# Patient Record
Sex: Female | Born: 1945 | Race: White | Hispanic: No | Smoking: Former smoker
Health system: Southern US, Community
[De-identification: ages and names within clinical notes are randomized; demographics above are authoritative.]

## PROBLEM LIST (undated history)

## (undated) DIAGNOSIS — H3530 Unspecified macular degeneration: Secondary | ICD-10-CM

## (undated) DIAGNOSIS — E781 Pure hyperglyceridemia: Secondary | ICD-10-CM

## (undated) DIAGNOSIS — C499 Malignant neoplasm of connective and soft tissue, unspecified: Secondary | ICD-10-CM

## (undated) DIAGNOSIS — C4499 Other specified malignant neoplasm of skin, unspecified: Secondary | ICD-10-CM

## (undated) DIAGNOSIS — I447 Left bundle-branch block, unspecified: Secondary | ICD-10-CM

## (undated) DIAGNOSIS — I6502 Occlusion and stenosis of left vertebral artery: Secondary | ICD-10-CM

## (undated) DIAGNOSIS — Z8679 Personal history of other diseases of the circulatory system: Secondary | ICD-10-CM

## (undated) DIAGNOSIS — I10 Essential (primary) hypertension: Secondary | ICD-10-CM

## (undated) DIAGNOSIS — K219 Gastro-esophageal reflux disease without esophagitis: Secondary | ICD-10-CM

## (undated) DIAGNOSIS — D509 Iron deficiency anemia, unspecified: Secondary | ICD-10-CM

## (undated) DIAGNOSIS — Z8719 Personal history of other diseases of the digestive system: Secondary | ICD-10-CM

## (undated) DIAGNOSIS — E119 Type 2 diabetes mellitus without complications: Secondary | ICD-10-CM

## (undated) DIAGNOSIS — K635 Polyp of colon: Secondary | ICD-10-CM

## (undated) DIAGNOSIS — I471 Supraventricular tachycardia: Secondary | ICD-10-CM

## (undated) DIAGNOSIS — N200 Calculus of kidney: Secondary | ICD-10-CM

## (undated) HISTORY — DX: Occlusion and stenosis of left vertebral artery: I65.02

## (undated) HISTORY — PX: FRACTURE SURGERY: SHX138

## (undated) HISTORY — DX: Pure hyperglyceridemia: E78.1

## (undated) HISTORY — DX: Essential (primary) hypertension: I10

## (undated) HISTORY — DX: Type 2 diabetes mellitus without complications: E11.9

## (undated) HISTORY — DX: Left bundle-branch block, unspecified: I44.7

## (undated) HISTORY — DX: Supraventricular tachycardia: I47.1

## (undated) HISTORY — DX: Unspecified macular degeneration: H35.30

## (undated) HISTORY — DX: Personal history of other diseases of the circulatory system: Z86.79

## (undated) HISTORY — DX: Polyp of colon: K63.5

---
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
## 1966-07-18 HISTORY — PX: APPENDECTOMY: SHX54

## 1970-07-18 HISTORY — PX: CYSTOSCOPY/RETROGRADE/URETEROSCOPY/STONE EXTRACTION WITH BASKET: SHX5317

## 1979-07-19 HISTORY — PX: EXCISIONAL HEMORRHOIDECTOMY: SHX1541

## 1992-07-18 HISTORY — PX: TOTAL ABDOMINAL HYSTERECTOMY: SHX209

## 1994-07-18 DIAGNOSIS — E781 Pure hyperglyceridemia: Secondary | ICD-10-CM

## 1994-07-18 HISTORY — DX: Pure hyperglyceridemia: E78.1

## 1997-07-18 HISTORY — PX: CYSTOSCOPY W/ URETEROSCOPY W/ LITHOTRIPSY: SUR380

## 1998-07-18 DIAGNOSIS — I10 Essential (primary) hypertension: Secondary | ICD-10-CM

## 1998-07-18 HISTORY — DX: Essential (primary) hypertension: I10

## 2005-07-18 HISTORY — PX: ORIF PROXIMAL TIBIAL PLATEAU FRACTURE: SUR953

## 2007-07-19 DIAGNOSIS — I447 Left bundle-branch block, unspecified: Secondary | ICD-10-CM

## 2007-07-19 DIAGNOSIS — I471 Supraventricular tachycardia, unspecified: Secondary | ICD-10-CM

## 2007-07-19 HISTORY — DX: Left bundle-branch block, unspecified: I44.7

## 2007-07-19 HISTORY — DX: Supraventricular tachycardia: I47.1

## 2007-07-19 HISTORY — DX: Supraventricular tachycardia, unspecified: I47.10

## 2007-08-19 HISTORY — PX: OTHER SURGICAL HISTORY: SHX169

## 2009-01-15 HISTORY — PX: OTHER SURGICAL HISTORY: SHX169

## 2010-07-18 DIAGNOSIS — E119 Type 2 diabetes mellitus without complications: Secondary | ICD-10-CM | POA: Insufficient documentation

## 2010-07-18 HISTORY — DX: Type 2 diabetes mellitus without complications: E11.9

## 2011-10-25 DIAGNOSIS — L989 Disorder of the skin and subcutaneous tissue, unspecified: Secondary | ICD-10-CM | POA: Diagnosis not present

## 2011-10-25 DIAGNOSIS — L820 Inflamed seborrheic keratosis: Secondary | ICD-10-CM | POA: Diagnosis not present

## 2011-10-25 DIAGNOSIS — L819 Disorder of pigmentation, unspecified: Secondary | ICD-10-CM | POA: Diagnosis not present

## 2011-11-02 DIAGNOSIS — I10 Essential (primary) hypertension: Secondary | ICD-10-CM | POA: Diagnosis not present

## 2011-11-02 DIAGNOSIS — F411 Generalized anxiety disorder: Secondary | ICD-10-CM | POA: Diagnosis not present

## 2011-11-02 DIAGNOSIS — E781 Pure hyperglyceridemia: Secondary | ICD-10-CM | POA: Diagnosis not present

## 2011-11-02 DIAGNOSIS — E119 Type 2 diabetes mellitus without complications: Secondary | ICD-10-CM | POA: Diagnosis not present

## 2011-11-04 DIAGNOSIS — H353190 Nonexudative age-related macular degeneration, unspecified eye, stage unspecified: Secondary | ICD-10-CM | POA: Diagnosis not present

## 2011-11-24 DIAGNOSIS — E119 Type 2 diabetes mellitus without complications: Secondary | ICD-10-CM | POA: Diagnosis not present

## 2011-11-24 DIAGNOSIS — H353190 Nonexudative age-related macular degeneration, unspecified eye, stage unspecified: Secondary | ICD-10-CM | POA: Diagnosis not present

## 2012-02-15 DIAGNOSIS — R5383 Other fatigue: Secondary | ICD-10-CM | POA: Diagnosis not present

## 2012-02-15 DIAGNOSIS — F411 Generalized anxiety disorder: Secondary | ICD-10-CM | POA: Diagnosis not present

## 2012-02-15 DIAGNOSIS — R5381 Other malaise: Secondary | ICD-10-CM | POA: Diagnosis not present

## 2012-02-15 DIAGNOSIS — R002 Palpitations: Secondary | ICD-10-CM | POA: Diagnosis not present

## 2012-02-15 DIAGNOSIS — E119 Type 2 diabetes mellitus without complications: Secondary | ICD-10-CM | POA: Diagnosis not present

## 2012-02-15 DIAGNOSIS — R079 Chest pain, unspecified: Secondary | ICD-10-CM | POA: Diagnosis not present

## 2012-02-17 DIAGNOSIS — E785 Hyperlipidemia, unspecified: Secondary | ICD-10-CM | POA: Diagnosis not present

## 2012-02-17 DIAGNOSIS — R002 Palpitations: Secondary | ICD-10-CM | POA: Diagnosis not present

## 2012-02-17 DIAGNOSIS — E119 Type 2 diabetes mellitus without complications: Secondary | ICD-10-CM | POA: Diagnosis not present

## 2012-02-21 DIAGNOSIS — R079 Chest pain, unspecified: Secondary | ICD-10-CM | POA: Diagnosis not present

## 2012-02-22 DIAGNOSIS — R002 Palpitations: Secondary | ICD-10-CM | POA: Diagnosis not present

## 2012-02-23 DIAGNOSIS — I447 Left bundle-branch block, unspecified: Secondary | ICD-10-CM | POA: Diagnosis not present

## 2012-03-07 DIAGNOSIS — R0602 Shortness of breath: Secondary | ICD-10-CM | POA: Diagnosis not present

## 2012-03-30 DIAGNOSIS — R002 Palpitations: Secondary | ICD-10-CM | POA: Diagnosis not present

## 2012-04-02 DIAGNOSIS — I10 Essential (primary) hypertension: Secondary | ICD-10-CM | POA: Diagnosis not present

## 2012-04-02 DIAGNOSIS — E119 Type 2 diabetes mellitus without complications: Secondary | ICD-10-CM | POA: Diagnosis not present

## 2012-04-02 DIAGNOSIS — R002 Palpitations: Secondary | ICD-10-CM | POA: Diagnosis not present

## 2012-04-05 DIAGNOSIS — R002 Palpitations: Secondary | ICD-10-CM | POA: Diagnosis not present

## 2012-04-23 DIAGNOSIS — I447 Left bundle-branch block, unspecified: Secondary | ICD-10-CM | POA: Diagnosis not present

## 2012-05-07 DIAGNOSIS — H353190 Nonexudative age-related macular degeneration, unspecified eye, stage unspecified: Secondary | ICD-10-CM | POA: Diagnosis not present

## 2012-07-24 DIAGNOSIS — I10 Essential (primary) hypertension: Secondary | ICD-10-CM | POA: Diagnosis not present

## 2012-07-24 DIAGNOSIS — F329 Major depressive disorder, single episode, unspecified: Secondary | ICD-10-CM | POA: Diagnosis not present

## 2012-07-24 DIAGNOSIS — R5383 Other fatigue: Secondary | ICD-10-CM | POA: Diagnosis not present

## 2012-07-24 DIAGNOSIS — E785 Hyperlipidemia, unspecified: Secondary | ICD-10-CM | POA: Diagnosis not present

## 2012-07-24 DIAGNOSIS — E119 Type 2 diabetes mellitus without complications: Secondary | ICD-10-CM | POA: Diagnosis not present

## 2012-07-24 DIAGNOSIS — R5381 Other malaise: Secondary | ICD-10-CM | POA: Diagnosis not present

## 2012-07-25 DIAGNOSIS — I447 Left bundle-branch block, unspecified: Secondary | ICD-10-CM | POA: Diagnosis not present

## 2012-08-06 DIAGNOSIS — Z1231 Encounter for screening mammogram for malignant neoplasm of breast: Secondary | ICD-10-CM | POA: Diagnosis not present

## 2012-09-19 DIAGNOSIS — I447 Left bundle-branch block, unspecified: Secondary | ICD-10-CM | POA: Diagnosis not present

## 2012-11-20 DIAGNOSIS — F329 Major depressive disorder, single episode, unspecified: Secondary | ICD-10-CM | POA: Diagnosis not present

## 2012-11-20 DIAGNOSIS — R05 Cough: Secondary | ICD-10-CM | POA: Diagnosis not present

## 2012-11-20 DIAGNOSIS — E119 Type 2 diabetes mellitus without complications: Secondary | ICD-10-CM | POA: Diagnosis not present

## 2012-11-20 DIAGNOSIS — I10 Essential (primary) hypertension: Secondary | ICD-10-CM | POA: Diagnosis not present

## 2012-11-22 DIAGNOSIS — E119 Type 2 diabetes mellitus without complications: Secondary | ICD-10-CM | POA: Diagnosis not present

## 2012-11-22 DIAGNOSIS — H353190 Nonexudative age-related macular degeneration, unspecified eye, stage unspecified: Secondary | ICD-10-CM | POA: Diagnosis not present

## 2013-01-04 DIAGNOSIS — I447 Left bundle-branch block, unspecified: Secondary | ICD-10-CM | POA: Diagnosis not present

## 2013-03-28 DIAGNOSIS — F411 Generalized anxiety disorder: Secondary | ICD-10-CM | POA: Diagnosis not present

## 2013-03-28 DIAGNOSIS — E785 Hyperlipidemia, unspecified: Secondary | ICD-10-CM | POA: Diagnosis not present

## 2013-03-28 DIAGNOSIS — I471 Supraventricular tachycardia: Secondary | ICD-10-CM | POA: Diagnosis not present

## 2013-03-28 DIAGNOSIS — F329 Major depressive disorder, single episode, unspecified: Secondary | ICD-10-CM | POA: Diagnosis not present

## 2013-03-28 DIAGNOSIS — Z79899 Other long term (current) drug therapy: Secondary | ICD-10-CM | POA: Diagnosis not present

## 2013-03-28 DIAGNOSIS — F3289 Other specified depressive episodes: Secondary | ICD-10-CM | POA: Diagnosis not present

## 2013-03-28 DIAGNOSIS — I10 Essential (primary) hypertension: Secondary | ICD-10-CM | POA: Diagnosis not present

## 2013-03-28 DIAGNOSIS — E1139 Type 2 diabetes mellitus with other diabetic ophthalmic complication: Secondary | ICD-10-CM | POA: Diagnosis not present

## 2013-04-25 DIAGNOSIS — I447 Left bundle-branch block, unspecified: Secondary | ICD-10-CM | POA: Diagnosis not present

## 2013-06-03 DIAGNOSIS — H353190 Nonexudative age-related macular degeneration, unspecified eye, stage unspecified: Secondary | ICD-10-CM | POA: Diagnosis not present

## 2013-06-03 DIAGNOSIS — H2510 Age-related nuclear cataract, unspecified eye: Secondary | ICD-10-CM | POA: Diagnosis not present

## 2013-07-18 DIAGNOSIS — C499 Malignant neoplasm of connective and soft tissue, unspecified: Secondary | ICD-10-CM

## 2013-07-18 DIAGNOSIS — C4499 Other specified malignant neoplasm of skin, unspecified: Secondary | ICD-10-CM

## 2013-07-18 HISTORY — DX: Other specified malignant neoplasm of skin, unspecified: C44.99

## 2013-07-18 HISTORY — DX: Malignant neoplasm of connective and soft tissue, unspecified: C49.9

## 2013-07-18 HISTORY — PX: OTHER SURGICAL HISTORY: SHX169

## 2013-07-18 HISTORY — PX: IRRIGATION AND DEBRIDEMENT SEBACEOUS CYST: SHX5255

## 2013-07-18 HISTORY — PX: SOFT TISSUE BIOPSY: SHX1053

## 2013-07-24 DIAGNOSIS — L03319 Cellulitis of trunk, unspecified: Secondary | ICD-10-CM | POA: Diagnosis not present

## 2013-07-24 DIAGNOSIS — L0390 Cellulitis, unspecified: Secondary | ICD-10-CM | POA: Diagnosis not present

## 2013-07-24 DIAGNOSIS — L02219 Cutaneous abscess of trunk, unspecified: Secondary | ICD-10-CM | POA: Diagnosis not present

## 2013-07-24 DIAGNOSIS — E1139 Type 2 diabetes mellitus with other diabetic ophthalmic complication: Secondary | ICD-10-CM | POA: Diagnosis not present

## 2013-07-24 DIAGNOSIS — L0291 Cutaneous abscess, unspecified: Secondary | ICD-10-CM | POA: Diagnosis not present

## 2013-07-24 DIAGNOSIS — B9689 Other specified bacterial agents as the cause of diseases classified elsewhere: Secondary | ICD-10-CM | POA: Diagnosis not present

## 2013-09-18 DIAGNOSIS — D485 Neoplasm of uncertain behavior of skin: Secondary | ICD-10-CM | POA: Diagnosis not present

## 2013-09-18 DIAGNOSIS — D235 Other benign neoplasm of skin of trunk: Secondary | ICD-10-CM | POA: Diagnosis not present

## 2013-09-18 DIAGNOSIS — Z1231 Encounter for screening mammogram for malignant neoplasm of breast: Secondary | ICD-10-CM | POA: Diagnosis not present

## 2013-09-18 DIAGNOSIS — L723 Sebaceous cyst: Secondary | ICD-10-CM | POA: Diagnosis not present

## 2013-09-18 DIAGNOSIS — L820 Inflamed seborrheic keratosis: Secondary | ICD-10-CM | POA: Diagnosis not present

## 2013-09-20 DIAGNOSIS — E119 Type 2 diabetes mellitus without complications: Secondary | ICD-10-CM | POA: Diagnosis not present

## 2013-09-20 DIAGNOSIS — K219 Gastro-esophageal reflux disease without esophagitis: Secondary | ICD-10-CM | POA: Diagnosis not present

## 2013-09-20 DIAGNOSIS — E785 Hyperlipidemia, unspecified: Secondary | ICD-10-CM | POA: Diagnosis not present

## 2013-09-20 DIAGNOSIS — L0291 Cutaneous abscess, unspecified: Secondary | ICD-10-CM | POA: Diagnosis not present

## 2013-09-20 DIAGNOSIS — I10 Essential (primary) hypertension: Secondary | ICD-10-CM | POA: Diagnosis not present

## 2013-09-20 DIAGNOSIS — L0390 Cellulitis, unspecified: Secondary | ICD-10-CM | POA: Diagnosis not present

## 2013-09-30 DIAGNOSIS — E119 Type 2 diabetes mellitus without complications: Secondary | ICD-10-CM | POA: Diagnosis not present

## 2013-09-30 DIAGNOSIS — D235 Other benign neoplasm of skin of trunk: Secondary | ICD-10-CM | POA: Diagnosis not present

## 2013-09-30 DIAGNOSIS — E785 Hyperlipidemia, unspecified: Secondary | ICD-10-CM | POA: Diagnosis not present

## 2013-09-30 DIAGNOSIS — D485 Neoplasm of uncertain behavior of skin: Secondary | ICD-10-CM | POA: Diagnosis not present

## 2013-09-30 DIAGNOSIS — I10 Essential (primary) hypertension: Secondary | ICD-10-CM | POA: Diagnosis not present

## 2013-10-01 DIAGNOSIS — R011 Cardiac murmur, unspecified: Secondary | ICD-10-CM | POA: Diagnosis not present

## 2013-11-19 DIAGNOSIS — H2510 Age-related nuclear cataract, unspecified eye: Secondary | ICD-10-CM | POA: Diagnosis not present

## 2013-11-19 DIAGNOSIS — H353190 Nonexudative age-related macular degeneration, unspecified eye, stage unspecified: Secondary | ICD-10-CM | POA: Diagnosis not present

## 2013-11-28 ENCOUNTER — Encounter: Payer: Self-pay | Admitting: Cardiology

## 2013-11-28 ENCOUNTER — Ambulatory Visit (INDEPENDENT_AMBULATORY_CARE_PROVIDER_SITE_OTHER): Payer: Medicare Other | Admitting: Cardiology

## 2013-11-28 VITALS — BP 142/86 | Ht 63.5 in | Wt 148.9 lb

## 2013-11-28 DIAGNOSIS — I498 Other specified cardiac arrhythmias: Secondary | ICD-10-CM | POA: Diagnosis not present

## 2013-11-28 DIAGNOSIS — E782 Mixed hyperlipidemia: Secondary | ICD-10-CM | POA: Diagnosis not present

## 2013-11-28 DIAGNOSIS — I471 Supraventricular tachycardia: Secondary | ICD-10-CM

## 2013-11-28 DIAGNOSIS — I701 Atherosclerosis of renal artery: Secondary | ICD-10-CM | POA: Diagnosis not present

## 2013-11-28 DIAGNOSIS — Z79899 Other long term (current) drug therapy: Secondary | ICD-10-CM

## 2013-11-28 DIAGNOSIS — I447 Left bundle-branch block, unspecified: Secondary | ICD-10-CM | POA: Diagnosis not present

## 2013-11-28 NOTE — Progress Notes (Signed)
PATIENT: Isabella Wright MRN: [id number] DOB: 01-Jun-1946 PCP: Pcp Not In System  Clinic Note: Chief Complaint  Patient presents with  . Establish Care    , NO CHEST PAIN RECENTLY, SOB AT NIGHT RECENTLY -INDERAL; HX SVT, LBBB,-NOT SOB AFTER SHE CHG EFROM LNG ACTING TO SHORT ACTING PROPANADOL, NOEDEMA    HPI: Cathyjo Seeger is a 68 y.o. female - retired Equities trader with a PMH below who presents today for establishing cardiology care in [place] after moving up from [facility] 2 months ago.. She is widowed now for just under 2 years. Her husband was a retired Publishing rights manager who recently died of cancer.  She moved to [place] to be close to her family. She comes well prepared with a long list of medical history which includes SVT, hypertension, diabetes, hypercholesterolemia which has been very difficult to manage on macular degeneration and history of kidney stones. She is to significant SVT episodes that were often associated with stress. Several months ago (January 2015), she underwent an I&D procedure on a chest wall abscess. Ever since that timeframe, she has been notably improved. Prior to that time, she was having frequent recurrences of SVT most every day and was on Cardizem and Inderal.  Interval History: She had been on long-standing propranolol for SVT in the past. She seemed to be good with all the stress around her his last use of life, that she was more prone to SVT during that time. In the interim she has cut back almost all the caffeine, and now she is getting over the stress of loss of her husband, she will is noted at the SVTs are very very infrequent. He started to note feeling short of breath at night and just generalized fatigue. She basically switched from taking long-acting Inderal 80 mg tablets to take when necessary 20 mg tablets. This doing the checks he feels much better with no more night and dyspnea. She is also cut down significantly, and she was taking. Now only very infrequently when necessary since  her anxiety level and morning is now basically complete. Correlating with her stress levels, her triglyceride levels had taken roller coaster right with some levels being as high as 1000 and ending in his lower 100s. She is on pravastatin and fenofibrate.  Ever since changing her medication, she denies any further dyspnea. She does not have any chest tightness or pressure at rest or exertion. She tried to be active but is not really getting routine exercise. She denies any PND, orthopnea or edema. No recent episodes of rapid irregular heart beats. She does have occasional palpitations but fleeting. No syncope/presyncope, no TIA or amaurosis fugax symptoms. No melena hematochezia, hematuria, epistaxis. No claudication.  Past Medical History  Diagnosis Date  . Hypertriglyceridemia 1996    Very labile and difficult control: Began in '96 when she began taking Premarin after hysterectomy -- triglycerides increased to 3582 neuropathy in her feet> care for by endocrinologis; levels vary based on stress.;; Labs from March 2015: TC 150, TG 745, HDL 26, LDL 45  . Hypertension 2000    2009: Renal Dopplers showed R RA ostial stenosis -- (told not a problem or 20 yrs)  . Diabetes mellitus type 2, controlled 2012  . Paroxysmal SVT (supraventricular tachycardia) 2009    less frequent following I&D of chest wall abscess (Jan 015)  . LBBB (left bundle branch block) 2009    Initially diagnosed as rate related during stress echo.  . Macular degeneration     Dry  Prior Cardiac Evaluation and Past Surgical History: Past Surgical History  Procedure Laterality Date  . Transthoracic echocardiogram  2013    normal  . Transthoracic echocardiogram  March 2015    aortic sclerosis; without stenosis. Normal EF  . Treadmill stress echo  01/2009    No regional wall motion abnormalities with stress == non-ischemic; rate related incomplete left bundle branch block  . Appendectomy  1968  .  Cystoscopy/retrograde/ureteroscopy/stone extraction with basket  1972  . Excisional hemorrhoidectomy   1981  . Total abdominal hysterectomy w/ bilateral salpingoophorectomy  1994  . Cystoscopy w/ ureteroscopy w/ lithotripsy   1999    Large calcium oxalate stone  . Orif proximal tibial plateau fracture Left 2007    , Related shattered left tibial plateau with reattachment of torn ligaments and insertion of titanium plate and screws.  . Abdominal doppler ultrasound  February 2009     Right ostial renal artery stenosis; normal renal structure.  . Abcess drainage  January 2015     chest wall; with antibiotics --> following this treatment, SVT episodes became much less frequent.    Allergies  Allergen Reactions  . Demerol [Meperidine] Nausea And Vomiting    Current Outpatient Prescriptions  Medication Sig Dispense Refill  . cholecalciferol (VITAMIN D) 1000 UNITS tablet Take 1,000 Units by mouth daily.      Marland Kitchen diltiazem (CARDIZEM CD) 180 MG 24 hr capsule Take 180 mg by mouth daily.      . fenofibrate 160 MG tablet Take 160 mg by mouth daily.       Marland Kitchen glimepiride (AMARYL) 2 MG tablet Take 2 mg by mouth daily with breakfast.       . hydrochlorothiazide (HYDRODIURIL) 25 MG tablet Take 25 mg by mouth daily.       . metFORMIN (GLUCOPHAGE-XR) 500 MG 24 hr tablet 1000 MG TAKE IN THE EVENING      . Multiple Vitamins-Minerals (PRESERVISION AREDS 2 PO) Take by mouth 2 (two) times daily.      . mupirocin ointment (BACTROBAN) 2 %       . omega-3 acid ethyl esters (LOVAZA) 1 G capsule Take 2 g by mouth 2 (two) times daily.       Marland Kitchen omeprazole (PRILOSEC OTC) 20 MG tablet Take 20 mg by mouth daily.      . pravastatin (PRAVACHOL) 10 MG tablet Take 10 mg by mouth every evening.       . propranolol (INDERAL) 20 MG tablet Take 20 mg by mouth at bedtime.      . ramipril (ALTACE) 10 MG capsule Take 10 mg by mouth daily.        No current facility-administered medications for this visit.    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 She is now widowed for just under 2 years. Her husband died of cancer. They do not have any children.   She recently (spring of 2015) moved to [place] to be closer to her brother, [name] and his family.   She been the caregiver for her ailing husband for 5 years. He passed away in 03/17/12, and she has had prolonged period of grieving partly due to her having lost 3 members of her immediate family during that time period as well.  --- She is under a lot of stress      She is a former smoker quit years ago. She does not drink alcohol. She  is active, but not routine exercise. She says that she tries to eat healthy and knows what usually affects her triglyceride levels.   She is hoping to dry get back into playing more golf and exercising now she is settled in and the weather is better.  Family History: Breast cancer in her sister; Heart disease in her brother, father, and mother.  ROS: A comprehensive Review of Systems - Negative except Symptoms noted in history of present illness, also improving depression type symptoms from grieving. obviously mild arthralgias.  PHYSICAL EXAM BP 142/86  Ht 5' 3.5" (1.613 m)  Wt 148 lb 14.4 oz (67.541 kg)  BMI 25.96 kg/m2 General appearance: alert, cooperative, appears stated age, no distress and well-nourished/well groomed. Pleasant mood and affect. HEENT: [place]/AT, EOMI, MMM, anicteric sclera Neck: no adenopathy, no carotid bruit, no JVD, supple, symmetrical, trachea midline and thyroid not enlarged, symmetric, no tenderness/mass/nodules Lungs: clear to auscultation bilaterally, normal percussion bilaterally and nonlabored, good air movement Heart: RRR, normal S1 with split S2. Non-displaced PMI. Mild 1/6 SEM at RUSB. Otherwise no other M/R./G. Abdomen: soft, non-tender; bowel sounds normal; no masses,  no organomegaly Extremities: extremities normal, atraumatic, no  cyanosis or edema and no edema, redness or tenderness in the calves or thighs Pulses: 2+ and symmetric Neurologic: Alert and oriented X 3, normal strength and tone. Normal symmetric reflexes. Normal coordination and gait   Adult ECG Report  Rate: 70 ;  Rhythm: normal sinus rhythm; left axis deviation with left bundle branch block  QRS Axis: -31 ;  otherwise normal intervals and durations.  Recent Labs: See hypertriglyceridemia and   Hemoglobin A1c 6.7; TSH 3.22  Creatinine 0.88, LFTs normal.  ASSESSMENT / PLAN: Pleasant relatively stable woman with long-standing hypertriglyceridemia, SVT, LBBB with essentially normal cardiac evaluation previously.  Encounter for long-term (current) use of other medications Check lipid panel and NMR panel  SVT (supraventricular tachycardia) Episodes seem to be very rare now. On file with her doing her atenolol her for perhaps we should just make it when necessary unless she has more episodes.  I did discuss with her the possibility of using vagal maneuvers to break an episode. He can also consider using her Klonopin if the stress is was driving her SVT episodes..  Mixed hyperlipidemia: High triglycerides, low HDL This is a very interesting trend of labs that she has. She is on power statin along with fenofibrate and Lovaza. Her last upper March, and her Lovaza dose was increased. She should be due for labs in June-July timeframe. We'll then get a new subtle labs at that time. I man would consider switching her to Crestor for better HDL. May also consider niacin. I will consult with our lipid clinic pharmacist.  Renal artery stenosis It's been several years since renal Dopplers were done. Since she is on an ACE inhibitor, will going check repeat Dopplers to assess the reported stenosis.  LBBB (left bundle branch block) Initially was rate related during a stress test and now persistent. She has had a Negative Myoview in relatively normal echo in the  past.   Close to one hour total spent with the patient, and an additional 30 minutes with chart.  Orders Placed This Encounter  Procedures  . NMR Lipoprofile with Lipids  . Comprehensive metabolic panel    Order Specific Question:  Has the patient fasted?    Answer:  Yes  . EKG 12-Lead  . Renal Artery Duplex    Dx renal stenosis    Standing  Status: Future     Number of Occurrences:      Standing Expiration Date: 11/28/2014    Scheduling Instructions:     June or July 2015    Order Specific Question:  Where should this test be performed:    Answer:  [facility]   Followup: 3 months  Sharmain Lastra W. Ellyn Hack, M.D., M.S. Interventional Cardiolgy [facility]

## 2013-11-28 NOTE — Patient Instructions (Addendum)
Your physician has requested that you have a renal artery duplex. During this test, an ultrasound is used to evaluate blood flow to the kidneys. Allow one hour for this exam. Do not eat after midnight the day before and avoid carbonated beverages. Take your medications as you usually do      .( June/july 2015)  Labs done when you have doppler--NMR-with lipids, CMP   TAKE INDERAL ON AS NEEDED BASIS   Your physician wants you to follow-up in 3 MONTHS Dr Ellyn Hack. 30 mins appointment.  You will receive a reminder letter in the mail two months in advance. If you don't receive a letter, please call our office to schedule the follow-up appointment.   3

## 2013-12-03 ENCOUNTER — Encounter: Payer: Self-pay | Admitting: Cardiology

## 2013-12-03 DIAGNOSIS — Z79899 Other long term (current) drug therapy: Secondary | ICD-10-CM | POA: Insufficient documentation

## 2013-12-03 DIAGNOSIS — I471 Supraventricular tachycardia: Secondary | ICD-10-CM | POA: Insufficient documentation

## 2013-12-03 DIAGNOSIS — E782 Mixed hyperlipidemia: Secondary | ICD-10-CM | POA: Insufficient documentation

## 2013-12-03 DIAGNOSIS — I447 Left bundle-branch block, unspecified: Secondary | ICD-10-CM | POA: Insufficient documentation

## 2013-12-03 DIAGNOSIS — I701 Atherosclerosis of renal artery: Secondary | ICD-10-CM | POA: Insufficient documentation

## 2013-12-03 NOTE — Assessment & Plan Note (Signed)
This is a very interesting trend of labs that she has. She is on power statin along with fenofibrate and Lovaza. Her last upper March, and her Lovaza dose was increased. She should be due for labs in June-July timeframe. We'll then get a new subtle labs at that time. I man would consider switching her to Crestor for better HDL. May also consider niacin. I will consult with our lipid clinic pharmacist.

## 2013-12-03 NOTE — Assessment & Plan Note (Addendum)
Check lipid panel and NMR panel

## 2013-12-03 NOTE — Assessment & Plan Note (Signed)
It's been several years since renal Dopplers were done. Since she is on an ACE inhibitor, will going check repeat Dopplers to assess the reported stenosis.

## 2013-12-03 NOTE — Assessment & Plan Note (Addendum)
Episodes seem to be very rare now. On file with her doing her atenolol her for perhaps we should just make it when necessary unless she has more episodes.  I did discuss with her the possibility of using vagal maneuvers to break an episode. He can also consider using her Klonopin if the stress is was driving her SVT episodes.Marland Kitchen

## 2013-12-03 NOTE — Assessment & Plan Note (Signed)
Initially was rate related during a stress test and now persistent. She has had a Negative Myoview in relatively normal echo in the past.

## 2013-12-30 ENCOUNTER — Ambulatory Visit (HOSPITAL_COMMUNITY)
Admission: RE | Admit: 2013-12-30 | Discharge: 2013-12-30 | Disposition: A | Discharge status: Home / Self Care | Payer: Medicare Other | Source: Ambulatory Visit | Attending: Cardiovascular Disease | Admitting: Cardiovascular Disease

## 2013-12-30 DIAGNOSIS — Z79899 Other long term (current) drug therapy: Secondary | ICD-10-CM | POA: Diagnosis not present

## 2013-12-30 DIAGNOSIS — I701 Atherosclerosis of renal artery: Secondary | ICD-10-CM | POA: Diagnosis not present

## 2013-12-30 DIAGNOSIS — I10 Essential (primary) hypertension: Secondary | ICD-10-CM

## 2013-12-30 DIAGNOSIS — E782 Mixed hyperlipidemia: Secondary | ICD-10-CM | POA: Diagnosis not present

## 2013-12-30 LAB — COMPREHENSIVE METABOLIC PANEL WITH GFR
ALT: 28 U/L (ref 0–35)
AST: 27 U/L (ref 0–37)
Albumin: 4.3 g/dL (ref 3.5–5.2)
Alkaline Phosphatase: 68 U/L (ref 39–117)
BUN: 18 mg/dL (ref 6–23)
CO2: 26 meq/L (ref 19–32)
Calcium: 10.1 mg/dL (ref 8.4–10.5)
Chloride: 98 meq/L (ref 96–112)
Creat: 0.9 mg/dL (ref 0.50–1.10)
Glucose, Bld: 133 mg/dL — ABNORMAL HIGH (ref 70–99)
Potassium: 4 meq/L (ref 3.5–5.3)
Sodium: 138 meq/L (ref 135–145)
Total Bilirubin: 0.5 mg/dL (ref 0.2–1.2)
Total Protein: 6.9 g/dL (ref 6.0–8.3)

## 2013-12-30 NOTE — Progress Notes (Signed)
Renal Duplex Completed. Marcayla Budge, BS, RDMS, RVT  

## 2013-12-31 LAB — NMR LIPOPROFILE WITH LIPIDS
Cholesterol, Total: 149 mg/dL (ref <200)
HDL Particle Number: 29 umol/L — ABNORMAL LOW (ref >=30.5)
HDL SIZE: 8.5 nm — AB (ref >=9.2)
HDL-C: 31 mg/dL — ABNORMAL LOW (ref >=40)
LARGE VLDL-P: 21.8 nmol/L — AB (ref <=2.7)
LDL Particle Number: 1467 nmol/L — ABNORMAL HIGH (ref <1000)
LDL SIZE: 19.6 nm — AB (ref >20.5)
LP-IR Score: 87 — ABNORMAL HIGH (ref <=45)
Large HDL-P: <1.3 umol/L — ABNORMAL LOW (ref >=4.8)
SMALL LDL PARTICLE NUMBER: 1050 nmol/L — AB (ref <=527)
Triglycerides: 456 mg/dL — ABNORMAL HIGH (ref <150)
VLDL Size: 54.7 nm — ABNORMAL HIGH (ref <=46.6)

## 2014-01-02 ENCOUNTER — Encounter: Payer: Self-pay | Admitting: *Deleted

## 2014-01-02 ENCOUNTER — Telehealth: Payer: Self-pay | Admitting: *Deleted

## 2014-01-02 HISTORY — PX: OTHER SURGICAL HISTORY: SHX169

## 2014-01-02 NOTE — Telephone Encounter (Signed)
Spoke to patient. Result given . Verbalized understanding PATIENT STATES LABS NUMBER ARE BETTER. SHE STATES THE LAST TIME TRIG. WERE IN 700'S RN INFORMED PATIENT TO KEEP APPOINTMENT AND A LETTER MAILED WITH RESULTS

## 2014-01-02 NOTE — Telephone Encounter (Signed)
Message copied by Raiford Simmonds on Thu Jan 02, 2014 10:11 AM ------      Message from: Leonie Man      Created: Wed Jan 01, 2014  8:46 AM       Interesting results - Triglycerides are still too high. Cannot calculate LDL.  Looks like the last screening levels were inaccurate.      Continue Omega 3 FA & fenofibrate.       Need to avoid foods high in Carbohydrates & simple sugars, starches -- i.e. White bread, pastas, corn, white rice.      In addition to diet -- need to increase exercise level.            Leonie Man, MD       ------

## 2014-01-07 ENCOUNTER — Telehealth: Payer: Self-pay | Admitting: *Deleted

## 2014-01-07 NOTE — Telephone Encounter (Signed)
Spoke to patient. Result given . Verbalized understanding  

## 2014-01-07 NOTE — Telephone Encounter (Signed)
Message copied by Raiford Simmonds on Tue Jan 07, 2014  3:34 PM ------      Message from: Leonie Man      Created: Thu Jan 02, 2014 11:30 PM       Will recheck in ~6 months to be sure that things are stable -- then probably ~yearly.            Leonie Man, MD       ------

## 2014-03-04 DIAGNOSIS — L723 Sebaceous cyst: Secondary | ICD-10-CM | POA: Diagnosis not present

## 2014-03-12 DIAGNOSIS — C44509 Unspecified malignant neoplasm of skin of other part of trunk: Secondary | ICD-10-CM | POA: Diagnosis not present

## 2014-03-12 DIAGNOSIS — C44599 Other specified malignant neoplasm of skin of other part of trunk: Secondary | ICD-10-CM | POA: Diagnosis not present

## 2014-03-12 DIAGNOSIS — D492 Neoplasm of unspecified behavior of bone, soft tissue, and skin: Secondary | ICD-10-CM | POA: Diagnosis not present

## 2014-03-17 ENCOUNTER — Encounter: Payer: Self-pay | Admitting: Cardiology

## 2014-03-17 ENCOUNTER — Ambulatory Visit (INDEPENDENT_AMBULATORY_CARE_PROVIDER_SITE_OTHER): Payer: Medicare Other | Admitting: Cardiology

## 2014-03-17 VITALS — BP 120/70 | HR 64 | Ht 63.5 in | Wt 148.1 lb

## 2014-03-17 DIAGNOSIS — I471 Supraventricular tachycardia: Secondary | ICD-10-CM

## 2014-03-17 DIAGNOSIS — E782 Mixed hyperlipidemia: Secondary | ICD-10-CM | POA: Diagnosis not present

## 2014-03-17 DIAGNOSIS — I701 Atherosclerosis of renal artery: Secondary | ICD-10-CM | POA: Diagnosis not present

## 2014-03-17 DIAGNOSIS — I10 Essential (primary) hypertension: Secondary | ICD-10-CM | POA: Diagnosis not present

## 2014-03-17 DIAGNOSIS — I498 Other specified cardiac arrhythmias: Secondary | ICD-10-CM | POA: Diagnosis not present

## 2014-03-17 NOTE — Patient Instructions (Addendum)
You have been referred to [facility]- concerning your cholesterol  Stay with PROPANOLOL   HERE IS THE PHONE NUMBER [place]  Your physician wants you to follow-up in 6 MONTH Dr Ellyn Hack.[place] will receive a reminder letter in the mail two months in advance. If you don't receive a letter, please call our office to schedule the follow-up appointment.

## 2014-03-19 DIAGNOSIS — I10 Essential (primary) hypertension: Secondary | ICD-10-CM | POA: Insufficient documentation

## 2014-03-19 NOTE — Assessment & Plan Note (Signed)
No significant stenoses.

## 2014-03-19 NOTE — Assessment & Plan Note (Addendum)
She has quite labile values and is very invested in this number. Where they're having hypertension and diabetes, she is at essentially the criteria for metabolic syndrome and therefore is warranted to have aggressive management. She clearly has a familial genetic predisposition to her dyslipidemia. She is probably best suited for treatment of a specialized lipid clinic. She is on vibrate as well as low dose of statin and Lovaza. I want to refer her to Alferd Apa, [facility] at the [facility], as this is a managing a lipid clinic, in order to have a more expert level of involvement.

## 2014-03-19 NOTE — Progress Notes (Signed)
PCP: Pcp Not In System  Clinic Note: Chief Complaint  Patient presents with  . 3 month visit    no chest pain , no sob , no edema   HPI: Isabella Wright is a 68 y.o. female with a Cardiovascular Problem List below who presents today for three-month cardiology followup. 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 She is widowed now for just under 2 years. Her husband was a retired Publishing rights manager who recently died of cancer. She moved to [place] to be close to her family. Her most notable past medical history includes recurrent bouts of SVT. She also is very difficult to control [name] manage hypercholesterolemia with hypertriglyceridemia. Her most recent cholesterol panel the LDL was not able to be calculated because of triglycerides. She had  NMR panel back in June that had total: 49, HDL 4/29, HDL size low at 15 HDLC admitted at 31. LDL heart murmur was 1467 with LDL size 19.6. Small LDL part and was 1050. Since I saw her in May, she had a renal duplex done which showed no significant stenosis.  Interval History: Mrs. last saw her, she is essentially completely removed Hotel and is starting to get settled in. A lot of stress as she previously is improved. She overall feels "pretty good", has not really had much in the way of her routine bursts of SVT. She was feeling really groggy and lethargic and so she stopped taking the Klonopin that she been on and has really started feeling better. She also noted that going back to taking the long-acting propranolol Nunzio Cory is a very much more than doing the when necessary short acting. Since making that change as we initially discussed, she's been doing much better. She denies any significant syncopal and he is able to symptoms from the few episodes that she has had a heartbeats. They have been relatively short-lived. She denies any chest tightness or pressure respiratory  exertion. No PND, orthopnea or edema. No TIA or versus GI symptoms. No claudication symptoms.  She is still quite concerned about her "roller coaster" lipid levels.  Past Medical History  Diagnosis Date  . Hypertriglyceridemia 1996    Very labile and difficult control: Began in '96 when she began taking Premarin after hysterectomy -- triglycerides increased to 3582 neuropathy in her feet> care for by endocrinologis; levels vary based on stress.;; Labs from March 2015: TC 150, TG 745, HDL 26, LDL 45  . Hypertension 2000    2009: Renal Dopplers showed R RA ostial stenosis -- (told not a problem or 20 yrs)  . Diabetes mellitus type 2, controlled 2012  . Paroxysmal SVT (supraventricular tachycardia) 2009    less frequent following I&D of chest wall abscess (Jan 015)  . LBBB (left bundle branch block) 2009    Initially diagnosed as rate related during stress echo.  . Macular degeneration     Dry    Prior Cardiac Evaluation and Past Surgical History: Past Surgical History  Procedure Laterality Date  . Transthoracic echocardiogram  2013    normal  . Transthoracic echocardiogram  March 2015    aortic sclerosis; without stenosis. Normal EF  . Treadmill stress echo  01/2009    No regional wall motion abnormalities with stress == non-ischemic; rate related incomplete left bundle branch block  . Appendectomy  1968  . Cystoscopy/retrograde/ureteroscopy/stone extraction with basket  1972  . Excisional  hemorrhoidectomy   1981  . Total abdominal hysterectomy w/ bilateral salpingoophorectomy  1994  . Cystoscopy w/ ureteroscopy w/ lithotripsy   1999    Large calcium oxalate stone  . Orif proximal tibial plateau fracture Left 2007    , Related shattered left tibial plateau with reattachment of torn ligaments and insertion of titanium plate and screws.  . Abdominal doppler ultrasound  February 2009     Right ostial renal artery stenosis; normal renal structure.  . Abcess drainage  January 2015      chest wall; with antibiotics --> following this treatment, SVT episodes became much less frequent.  . Renal artery duplex  01/02/2014    R&L RA - mildly elevated velocities - < 60%; Bilateral Kidneys - normal shape & size.   MEDICATIONS AND ALLERGIES REVIEWED IN EPIC No Change in Social and Family History  ROS: A comprehensive Review of Systems - Was reviewed Review of Systems  Constitutional: Negative for weight loss and malaise/fatigue.  Eyes: Negative for blurred vision and double vision.  Respiratory: Negative for cough, hemoptysis, sputum production, shortness of breath and wheezing.   Cardiovascular: Positive for palpitations.  Gastrointestinal: Negative for heartburn, constipation, blood in stool and melena.  Genitourinary: Negative for dysuria, frequency, hematuria and flank pain.  Neurological: Negative for dizziness, sensory change, speech change, focal weakness, seizures, loss of consciousness, weakness and headaches.  Psychiatric/Behavioral: Negative for depression. The patient is nervous/anxious.   All other systems reviewed and are negative.  Wt Readings from Last 3 Encounters:  03/17/14 148 lb 1.6 oz (67.178 kg)  11/28/13 148 lb 14.4 oz (67.541 kg)    PHYSICAL EXAM BP 120/70  Pulse 64  Ht 5' 3.5" (1.613 m)  Wt 148 lb 1.6 oz (67.178 kg)  BMI 25.82 kg/m2 General appearance: alert, cooperative, appears stated age, no distress and well-nourished/well groomed. Pleasant mood and affect.  HEENT: [place]/AT, EOMI, MMM, anicteric sclera  Neck: no adenopathy, no carotid bruit, no JVD, supple, symmetrical, trachea midline and thyroid not enlarged, symmetric, no tenderness/mass/nodules  Lungs: clear to auscultation bilaterally, normal percussion bilaterally and nonlabored, good air movement  Heart: RRR, normal S1 with split S2. Non-displaced PMI. Mild 1/6 SEM at RUSB. Otherwise no other M/R./G.  Abdomen: soft, non-tender; bowel sounds normal; no masses, no organomegaly  Extremities:  extremities normal, atraumatic, no cyanosis or edema and no edema, redness or tenderness in the calves or thighs  Pulses: 2+ and symmetric  Neurologic: Alert and oriented X 3, normal strength and tone. Normal symmetric reflexes. Normal coordination and gait   Adult ECG Report Not checked  Recent Labs reviewed in Epic:   NMR panel as noted below. Chemistry panel was relatively stable with the exception of glucose 133.  ASSESSMENT / PLAN: SVT (supraventricular tachycardia) Better controlled on propranolol. Continue with the long-acting version in addition to diltiazem. No longer using benzodiazepines  Mixed hyperlipidemia: High triglycerides, low HDL She has quite labile values and is very invested in this number. Where they're having hypertension and diabetes, she is at essentially the criteria for metabolic syndrome and therefore is warranted to have aggressive management. She clearly has a familial genetic predisposition to her dyslipidemia. She is probably best suited for treatment of a specialized lipid clinic. She is on vibrate as well as low dose of statin and Lovaza. I want to refer her to Alferd Apa, [facility] at the [facility], as this is a managing a lipid clinic, in order to have a more expert level  of involvement.  Renal artery stenosis No significant stenoses.  Essential hypertension Relatively stable blood pressure on ACE inhibitor, beta blocker and calcium channel blocker.    Orders Placed This Encounter  Procedures  . Ambulatory referral to Lipid Clinic    Referral Priority:  Routine    Referral Type:  Consultation    Referral Reason:  Specialty Services Required    Requested Specialty:  Hematology    Number of Visits Requested:  1   Meds ordered this encounter  Medications  . propranolol (INDERAL) 80 MG tablet    Sig: Take 80 mg by mouth daily. Extended release one tab every evening   She is asking about names for a primary physician. We  have given her several numbers for physicians/physicians offices to contact.  Followup: 6  months  Agustine Rossitto W. Ellyn Hack, M.D., M.S. Interventional Cardiologist [facility]

## 2014-03-19 NOTE — Assessment & Plan Note (Signed)
Relatively stable blood pressure on ACE inhibitor, beta blocker and calcium channel blocker.

## 2014-03-19 NOTE — Assessment & Plan Note (Signed)
Better controlled on propranolol. Continue with the long-acting version in addition to diltiazem. No longer using benzodiazepines

## 2014-03-20 ENCOUNTER — Ambulatory Visit (INDEPENDENT_AMBULATORY_CARE_PROVIDER_SITE_OTHER): Payer: Medicare Other | Admitting: Pharmacist

## 2014-03-20 ENCOUNTER — Telehealth: Payer: Self-pay | Admitting: Cardiology

## 2014-03-20 VITALS — Wt 149.0 lb

## 2014-03-20 DIAGNOSIS — I701 Atherosclerosis of renal artery: Secondary | ICD-10-CM

## 2014-03-20 DIAGNOSIS — E782 Mixed hyperlipidemia: Secondary | ICD-10-CM

## 2014-03-20 DIAGNOSIS — Z79899 Other long term (current) drug therapy: Secondary | ICD-10-CM

## 2014-03-20 MED ORDER — ROSUVASTATIN CALCIUM 10 MG PO TABS: 10.0000 mg | ORAL_TABLET | Freq: Every day | ORAL | Status: DC

## 2014-03-20 NOTE — Assessment & Plan Note (Addendum)
Patient and I discussed her medication options at length.  Will not add niacin at this time for fear of side effects, but will instead change to a more potent statin, and add some OTC fish oil as well.  Discussed a few dietary changes she needed to make, in particular reducing her egg consumption, and/or changing to egg whites.  She wants something that will keep her energy level up and also fill her up.  Almonds are energy efficient and are high in good fats/oils, and can lower cholesterol further.  Will recheck LFTs in 6 weeks to assess if Crestor causes LFTs to increase like Lipitor and Zocor did.  Will reassess lipid /liver in 3 months.  Increasing OTC fish further could an option in future if TG remain elevated.   Plan: 1.  Continue fenofibrate 160 mg once daily with largest meal of the day.  Continue Lovaza 2 in AM, 2 in PM. 2.  Stop pravastatin 10 mg once daily. 3.  Start Crestor 10 mg once daily instead. 4.  Add over the counter fish oil 2,000 mg daily. 5.  Limit egg yolks to no more than 2 per week.  Use eggs whites instead. 6.  Use almonds 1/2 - 1 cup per day for snack. 7.  Get liver enzymes tested on 05/01/14 - will call you with results. 7.  Recheck NMR LipoProfile and hepatic panel in 3 months (06/18/14 - fasting labs), and see Gay Filler 06/24/14  at 11:00 am

## 2014-03-20 NOTE — Patient Instructions (Addendum)
1.  Continue fenofibrate 160 mg once daily with largest meal of the day.  Continue Lovaza 2 in AM, 2 in PM. 2.  Stop pravastatin 10 mg once daily. 3.  Start Crestor 10 mg once daily instead. 4.  Add over the counter fish oil 2,000 mg daily. 5.  Limit egg yolks to no more than 2 per week.  Use eggs whites instead. 6.  Use almonds 1/2 - 1 cup per day for snack. 7.  Get liver enzymes tested on 05/01/14 - will call you with results. 7.  Recheck NMR LipoProfile and hepatic panel in 3 months (06/18/14 - fasting labs), and see Gay Filler 06/24/14  at 11:00 am

## 2014-03-20 NOTE — Progress Notes (Signed)
Patient is a 68 y.o. WF referred to lipid clinic by Dr. Ellyn Hack given h/o elevated TG and to evaluate NMR LipoProfile.  Patient has a h/o elevated TG (as high as 3500 mg/dL in 1996) which seemed to start in the mid 1990's when she was started on Premarin.  Her TG levels never improved after stopping Premarin.  She had a hysterectomy in 1994.  Patient has Type II DM and metabolic syndrome.  She carries her weight in her abdominal region like her mother.  Patient's TG are currently 456 mg/dL with LDL-P number of 1467 nmol/L.  She is currently taking fenofibrate 160 mg qd with food, Pravastatin 10 mg qd, and Lovaza 2 g bid.  She is tolerating regimen well.  She is scheduled to have a cancerous mass around her groin operated on in the near future.    Patient apparently tried Zocor and Lipitor years ago before she moved to [facility] and she had to stop as liver enzymes became elevated.  These LFTs normalized on pravastatin 10 mg qd.  She hasn't ever taken a higher dose of pravastatin before.  She doesn't recall how high LFTs were, and doesn't recall ever being told she had fatty liver before.  She does care her weight in abdomen though.  Patient has never tried niacin before.  She tells me she already flushes a lot, and given her fair skin, this medication may worsen her flushing problem.  Her A1C (6.7) and TSH (3.2) are controlled.  She doesn't drink alcohol.    RF:  Diabetes Type II, metabolic syndrome, HTN, age - LDL goal < 70, non-HDL goal < 100, LDL-P number goal < 1000 Meds:  Fenofibrate 160 mg qd, Lovaza 4 g/d, Pravastatin 10 mg qd Intolerant:  Zocor and Lipitor apparently caused elevated LFTs in past   Social history:  Denies alcohol use, denies tobacco use Diet:  Eats boiled eggs (5-6 per week) for breakfast, soup/sandwich for lunch typically, and dinner she cooks at home.  Patient rarely meat, and never eats sweets/desserts.  She doesn't drink soda or tea.  Drinks lots of water.   Exercise:  Walks 3 days per  week, sometimes two times on those days.  Labs 12/2013:  LDL-P number 1467, TG 456, TC 149, HDL 31, LDL not calc, LFTs normal (fenofibrate 160 mg qd, Lovaza 4 g/d, pravastatin 10 mg qd)  Current Outpatient Prescriptions  Medication Sig Dispense Refill  . cholecalciferol (VITAMIN D) 1000 UNITS tablet Take 1,000 Units by mouth daily.      Marland Kitchen diltiazem (CARDIZEM CD) 180 MG 24 hr capsule Take 180 mg by mouth daily.      . fenofibrate 160 MG tablet Take 160 mg by mouth daily.       Marland Kitchen glimepiride (AMARYL) 2 MG tablet Take 2 mg by mouth daily with breakfast.       . hydrochlorothiazide (HYDRODIURIL) 25 MG tablet Take 25 mg by mouth daily.       . metFORMIN (GLUCOPHAGE-XR) 500 MG 24 hr tablet 1000 MG TAKE IN THE EVENING      . Multiple Vitamins-Minerals (PRESERVISION AREDS 2 PO) Take by mouth 2 (two) times daily.      Marland Kitchen omega-3 acid ethyl esters (LOVAZA) 1 G capsule Take 2 g by mouth 2 (two) times daily.       Marland Kitchen omeprazole (PRILOSEC OTC) 20 MG tablet Take 20 mg by mouth daily.      . pravastatin (PRAVACHOL) 10 MG tablet Take 10 mg by mouth  every evening.       . propranolol (INDERAL) 20 MG tablet Take 20 mg by mouth at bedtime.      . propranolol (INDERAL) 80 MG tablet Take 80 mg by mouth daily. Extended release one tab every evening      . ramipril (ALTACE) 10 MG capsule Take 10 mg by mouth daily.        No current facility-administered medications for this visit.   Allergies  Allergen Reactions  . Demerol [Meperidine] Nausea And Vomiting   Family History  Problem Relation Age of Onset  . Heart disease Brother     Died at 42  . Heart disease Mother     Died at 75  . Heart disease Father     Died at 35  . Breast cancer Sister     Died at 54

## 2014-03-20 NOTE — Telephone Encounter (Signed)
Pt need the name of the internal medicine doctor that was recommended for her.She lost the name of the doctor and she need it asap. She went to the doctor this week and the found out she had cancer and she needs surgery. She must see internal medicine doctor before she have surgery.Please call her asap.

## 2014-03-28 DIAGNOSIS — E119 Type 2 diabetes mellitus without complications: Secondary | ICD-10-CM | POA: Diagnosis not present

## 2014-03-28 DIAGNOSIS — Z6825 Body mass index (BMI) 25.0-25.9, adult: Secondary | ICD-10-CM | POA: Diagnosis not present

## 2014-03-28 DIAGNOSIS — I447 Left bundle-branch block, unspecified: Secondary | ICD-10-CM | POA: Diagnosis not present

## 2014-03-28 DIAGNOSIS — K219 Gastro-esophageal reflux disease without esophagitis: Secondary | ICD-10-CM | POA: Diagnosis not present

## 2014-03-28 DIAGNOSIS — E781 Pure hyperglyceridemia: Secondary | ICD-10-CM | POA: Diagnosis not present

## 2014-03-28 DIAGNOSIS — Z01818 Encounter for other preprocedural examination: Secondary | ICD-10-CM | POA: Diagnosis not present

## 2014-03-28 DIAGNOSIS — Z1331 Encounter for screening for depression: Secondary | ICD-10-CM | POA: Diagnosis not present

## 2014-03-28 DIAGNOSIS — I10 Essential (primary) hypertension: Secondary | ICD-10-CM | POA: Diagnosis not present

## 2014-04-02 NOTE — Telephone Encounter (Signed)
Continuation   Patient wanted Dr Ellyn Hack to be aware she is having upcoming surgery in [place]. Recent biopsy - groin   Cyst - rare skin cancer  - resection  will forward to Dr Ellyn Hack.

## 2014-04-02 NOTE — Telephone Encounter (Signed)
RN spoke to patient  She states she had saw Dr Ardeth Perfect already, th

## 2014-04-02 NOTE — Telephone Encounter (Signed)
I wish her luck with her procedure. Glad she got in with Dr. Jolayne Panther.  [place]

## 2014-04-15 DIAGNOSIS — I10 Essential (primary) hypertension: Secondary | ICD-10-CM | POA: Diagnosis not present

## 2014-04-15 DIAGNOSIS — C44599 Other specified malignant neoplasm of skin of other part of trunk: Secondary | ICD-10-CM | POA: Diagnosis not present

## 2014-04-15 DIAGNOSIS — D487 Neoplasm of uncertain behavior of other specified sites: Secondary | ICD-10-CM | POA: Diagnosis not present

## 2014-04-15 DIAGNOSIS — E119 Type 2 diabetes mellitus without complications: Secondary | ICD-10-CM | POA: Diagnosis not present

## 2014-04-15 DIAGNOSIS — D485 Neoplasm of uncertain behavior of skin: Secondary | ICD-10-CM | POA: Diagnosis not present

## 2014-05-01 ENCOUNTER — Other Ambulatory Visit: Payer: Medicare Other

## 2014-05-02 DIAGNOSIS — Z79899 Other long term (current) drug therapy: Secondary | ICD-10-CM | POA: Diagnosis not present

## 2014-05-02 DIAGNOSIS — I358 Other nonrheumatic aortic valve disorders: Secondary | ICD-10-CM | POA: Diagnosis not present

## 2014-05-02 DIAGNOSIS — E119 Type 2 diabetes mellitus without complications: Secondary | ICD-10-CM | POA: Diagnosis not present

## 2014-05-02 DIAGNOSIS — I454 Nonspecific intraventricular block: Secondary | ICD-10-CM | POA: Diagnosis not present

## 2014-05-02 DIAGNOSIS — Z87891 Personal history of nicotine dependence: Secondary | ICD-10-CM | POA: Diagnosis not present

## 2014-05-02 DIAGNOSIS — C44599 Other specified malignant neoplasm of skin of other part of trunk: Secondary | ICD-10-CM | POA: Diagnosis not present

## 2014-05-02 DIAGNOSIS — E785 Hyperlipidemia, unspecified: Secondary | ICD-10-CM | POA: Diagnosis not present

## 2014-05-02 DIAGNOSIS — K219 Gastro-esophageal reflux disease without esophagitis: Secondary | ICD-10-CM | POA: Diagnosis not present

## 2014-05-02 DIAGNOSIS — C44509 Unspecified malignant neoplasm of skin of other part of trunk: Secondary | ICD-10-CM | POA: Diagnosis not present

## 2014-05-02 DIAGNOSIS — I10 Essential (primary) hypertension: Secondary | ICD-10-CM | POA: Diagnosis not present

## 2014-05-02 DIAGNOSIS — Z885 Allergy status to narcotic agent status: Secondary | ICD-10-CM | POA: Diagnosis not present

## 2014-05-20 ENCOUNTER — Other Ambulatory Visit: Payer: Medicare Other

## 2014-05-20 DIAGNOSIS — E785 Hyperlipidemia, unspecified: Secondary | ICD-10-CM | POA: Diagnosis not present

## 2014-05-20 DIAGNOSIS — I10 Essential (primary) hypertension: Secondary | ICD-10-CM | POA: Diagnosis not present

## 2014-05-20 DIAGNOSIS — C44799 Other specified malignant neoplasm of skin of left lower limb, including hip: Secondary | ICD-10-CM | POA: Diagnosis not present

## 2014-05-20 DIAGNOSIS — E119 Type 2 diabetes mellitus without complications: Secondary | ICD-10-CM | POA: Diagnosis not present

## 2014-05-20 DIAGNOSIS — Z0000 Encounter for general adult medical examination without abnormal findings: Secondary | ICD-10-CM | POA: Diagnosis not present

## 2014-05-20 DIAGNOSIS — E781 Pure hyperglyceridemia: Secondary | ICD-10-CM | POA: Diagnosis not present

## 2014-05-23 DIAGNOSIS — H3531 Nonexudative age-related macular degeneration: Secondary | ICD-10-CM | POA: Diagnosis not present

## 2014-05-27 DIAGNOSIS — Z0000 Encounter for general adult medical examination without abnormal findings: Secondary | ICD-10-CM | POA: Diagnosis not present

## 2014-05-27 DIAGNOSIS — C499 Malignant neoplasm of connective and soft tissue, unspecified: Secondary | ICD-10-CM | POA: Diagnosis not present

## 2014-05-27 DIAGNOSIS — K219 Gastro-esophageal reflux disease without esophagitis: Secondary | ICD-10-CM | POA: Diagnosis not present

## 2014-05-27 DIAGNOSIS — E119 Type 2 diabetes mellitus without complications: Secondary | ICD-10-CM | POA: Diagnosis not present

## 2014-05-27 DIAGNOSIS — I10 Essential (primary) hypertension: Secondary | ICD-10-CM | POA: Diagnosis not present

## 2014-05-27 DIAGNOSIS — Z23 Encounter for immunization: Secondary | ICD-10-CM | POA: Diagnosis not present

## 2014-05-27 DIAGNOSIS — E781 Pure hyperglyceridemia: Secondary | ICD-10-CM | POA: Diagnosis not present

## 2014-05-27 DIAGNOSIS — I447 Left bundle-branch block, unspecified: Secondary | ICD-10-CM | POA: Diagnosis not present

## 2014-05-30 DIAGNOSIS — Z1212 Encounter for screening for malignant neoplasm of rectum: Secondary | ICD-10-CM | POA: Diagnosis not present

## 2014-06-17 ENCOUNTER — Other Ambulatory Visit: Payer: Self-pay

## 2014-06-17 ENCOUNTER — Telehealth (HOSPITAL_COMMUNITY): Payer: Self-pay | Admitting: *Deleted

## 2014-06-17 MED ORDER — DILTIAZEM HCL ER COATED BEADS 180 MG PO CP24: 180.0000 mg | ORAL_CAPSULE | Freq: Every day | ORAL | Status: DC

## 2014-06-17 NOTE — Telephone Encounter (Signed)
Rx sent to pharmacy   

## 2014-06-18 ENCOUNTER — Other Ambulatory Visit: Payer: Medicare Other

## 2014-06-19 DIAGNOSIS — Z23 Encounter for immunization: Secondary | ICD-10-CM | POA: Diagnosis not present

## 2014-06-19 DIAGNOSIS — I10 Essential (primary) hypertension: Secondary | ICD-10-CM | POA: Diagnosis not present

## 2014-06-19 DIAGNOSIS — E781 Pure hyperglyceridemia: Secondary | ICD-10-CM | POA: Diagnosis not present

## 2014-06-19 DIAGNOSIS — Z6825 Body mass index (BMI) 25.0-25.9, adult: Secondary | ICD-10-CM | POA: Diagnosis not present

## 2014-06-19 DIAGNOSIS — E119 Type 2 diabetes mellitus without complications: Secondary | ICD-10-CM | POA: Diagnosis not present

## 2014-06-24 ENCOUNTER — Ambulatory Visit: Payer: Medicare Other | Admitting: Pharmacist

## 2014-07-01 DIAGNOSIS — E119 Type 2 diabetes mellitus without complications: Secondary | ICD-10-CM | POA: Diagnosis not present

## 2014-07-01 DIAGNOSIS — I10 Essential (primary) hypertension: Secondary | ICD-10-CM | POA: Diagnosis not present

## 2014-07-01 DIAGNOSIS — E781 Pure hyperglyceridemia: Secondary | ICD-10-CM | POA: Diagnosis not present

## 2014-07-01 DIAGNOSIS — Z6824 Body mass index (BMI) 24.0-24.9, adult: Secondary | ICD-10-CM | POA: Diagnosis not present

## 2014-07-14 DIAGNOSIS — E119 Type 2 diabetes mellitus without complications: Secondary | ICD-10-CM | POA: Diagnosis not present

## 2014-07-14 DIAGNOSIS — Z6823 Body mass index (BMI) 23.0-23.9, adult: Secondary | ICD-10-CM | POA: Diagnosis not present

## 2014-07-15 ENCOUNTER — Other Ambulatory Visit: Payer: Self-pay

## 2014-07-15 MED ORDER — HYDROCHLOROTHIAZIDE 25 MG PO TABS: 25.0000 mg | ORAL_TABLET | Freq: Every day | ORAL | Status: DC

## 2014-07-15 NOTE — Telephone Encounter (Signed)
Rx sent to pharmacy   

## 2014-07-18 DIAGNOSIS — K635 Polyp of colon: Secondary | ICD-10-CM

## 2014-07-18 HISTORY — DX: Polyp of colon: K63.5

## 2014-07-24 DIAGNOSIS — E1165 Type 2 diabetes mellitus with hyperglycemia: Secondary | ICD-10-CM | POA: Diagnosis not present

## 2014-07-24 DIAGNOSIS — I10 Essential (primary) hypertension: Secondary | ICD-10-CM | POA: Diagnosis not present

## 2014-07-24 DIAGNOSIS — Z6825 Body mass index (BMI) 25.0-25.9, adult: Secondary | ICD-10-CM | POA: Diagnosis not present

## 2014-08-06 DIAGNOSIS — E1165 Type 2 diabetes mellitus with hyperglycemia: Secondary | ICD-10-CM | POA: Diagnosis not present

## 2014-08-21 DIAGNOSIS — C44799 Other specified malignant neoplasm of skin of left lower limb, including hip: Secondary | ICD-10-CM | POA: Diagnosis not present

## 2014-08-21 DIAGNOSIS — Z09 Encounter for follow-up examination after completed treatment for conditions other than malignant neoplasm: Secondary | ICD-10-CM | POA: Diagnosis not present

## 2014-08-22 ENCOUNTER — Telehealth (HOSPITAL_COMMUNITY): Payer: Self-pay | Admitting: *Deleted

## 2014-08-26 DIAGNOSIS — I10 Essential (primary) hypertension: Secondary | ICD-10-CM | POA: Diagnosis not present

## 2014-08-26 DIAGNOSIS — E1165 Type 2 diabetes mellitus with hyperglycemia: Secondary | ICD-10-CM | POA: Diagnosis not present

## 2014-08-26 DIAGNOSIS — Z6825 Body mass index (BMI) 25.0-25.9, adult: Secondary | ICD-10-CM | POA: Diagnosis not present

## 2014-08-26 DIAGNOSIS — E781 Pure hyperglyceridemia: Secondary | ICD-10-CM | POA: Diagnosis not present

## 2014-09-16 ENCOUNTER — Encounter: Payer: Self-pay | Admitting: Cardiology

## 2014-09-16 ENCOUNTER — Ambulatory Visit (INDEPENDENT_AMBULATORY_CARE_PROVIDER_SITE_OTHER): Payer: Medicare Other | Admitting: Cardiology

## 2014-09-16 VITALS — BP 122/74 | HR 66 | Ht 63.0 in | Wt 144.5 lb

## 2014-09-16 DIAGNOSIS — I10 Essential (primary) hypertension: Secondary | ICD-10-CM

## 2014-09-16 DIAGNOSIS — Z79899 Other long term (current) drug therapy: Secondary | ICD-10-CM | POA: Diagnosis not present

## 2014-09-16 DIAGNOSIS — I471 Supraventricular tachycardia: Secondary | ICD-10-CM | POA: Diagnosis not present

## 2014-09-16 DIAGNOSIS — I447 Left bundle-branch block, unspecified: Secondary | ICD-10-CM | POA: Diagnosis not present

## 2014-09-16 DIAGNOSIS — E782 Mixed hyperlipidemia: Secondary | ICD-10-CM

## 2014-09-16 DIAGNOSIS — I701 Atherosclerosis of renal artery: Secondary | ICD-10-CM

## 2014-09-16 DIAGNOSIS — E785 Hyperlipidemia, unspecified: Secondary | ICD-10-CM | POA: Diagnosis not present

## 2014-09-16 NOTE — Progress Notes (Signed)
PCP: Velna Hatchet, MD  Clinic Note: Chief Complaint  Patient presents with  . Follow-up    6 Months follow-up  . Tachycardia    History of PSVT   HPI: Isabella Wright is a 69 y.o. female with a Cardiovascular Problem List below who presents today for 56-month cardiology followup. 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 She is widowed now.  Her husband was a retired Publishing rights manager who recently died of cancer. She moved to [place] to be close to her family  ~ 1 year ago. Her most notable past medical history includes recurrent bouts of SVT. She also is very difficult to control [name] manage hypercholesterolemia with hypertriglyceridemia   Since her last visit she's had a very difficult 6 months. 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 The recovery from that to go on time and was difficult for her to try to back and exercising. Following that she was started on various medications for her diabetes to try to get her off of the Amaryl that have proven to be unsuccessful. She comes with a big chart showing me how variable her glycemic control was with a different medications including Januvia, Invokana & Levimer insulin.  She reports herself back on Amaryl at a higher dose. Throughout this she was initially seen by her PCP and then was followed by [employer] [facility] would different choices of medications. She then went to see an endocrinologist and is yet to go back for followup. Clearly this is upset her extraordinarily because she spent about 20 minutes talking about it. But the majority of this time simply just allowing her to "vent ". My recommendation would be to just simply go back to what was working but would defer to endocrinologist or her PCP as this is not my expertise.  Interval History:   Now that her  glycemic control is improved, she overall feels "pretty good".  she has not really had much in the way of her routine bursts of SVT - only a few episodes in the last 6 months when she's had taken additional dose of Inderal 20 mg.  She denies any significant syncopal and he is able to symptoms from the few episodes that she has had a heartbeats. They have been relatively short-lived. She denies any chest tightness or pressure respiratory exertion. No PND, orthopnea or edema. No TIA or versus GI symptoms. No claudication symptoms.  She is still quite concerned about her "roller coaster" lipid levels -- but that discussion was overwritten by her concern with her glycemic control.  Past Medical History  Diagnosis Date  . Hypertriglyceridemia 1996    Very labile and difficult control: Began in '96 when she began taking Premarin after hysterectomy -- triglycerides increased to 3582 neuropathy in her feet> care for by endocrinologis; levels vary based on stress.;; Labs from March 2015: TC 150, TG 745, HDL 26, LDL 45  . Hypertension 2000    2009: Renal Dopplers showed R RA ostial stenosis -- (told not a problem or 20 yrs)  . Diabetes mellitus type 2, controlled 2012  . Paroxysmal SVT (supraventricular tachycardia) 2009    less frequent following I&D of chest wall abscess (Jan 015)  . LBBB (left bundle branch block) 2009    Initially diagnosed  as rate related during stress echo.  . Macular degeneration     Dry    Prior Cardiac Evaluation and Past Surgical History: Past Surgical History  Procedure Laterality Date  . Transthoracic echocardiogram  2013    normal  . Transthoracic echocardiogram  March 2015    aortic sclerosis; without stenosis. Normal EF  . Treadmill stress echo  01/2009    No regional wall motion abnormalities with stress == non-ischemic; rate related incomplete left bundle branch block  . Abdominal doppler ultrasound  February 2009     Right ostial renal artery stenosis; normal  renal structure.  . Abcess drainage  January 2015     chest wall; with antibiotics --> following this treatment, SVT episodes became much less frequent.  . Renal artery duplex  01/02/2014    R&L RA - mildly elevated velocities - < 60%; Bilateral Kidneys - normal shape & size.   Current Outpatient Prescriptions on File Prior to Visit  Medication Sig Dispense Refill  . cholecalciferol (VITAMIN D) 1000 UNITS tablet Take 1,000 Units by mouth daily.    Marland Kitchen diltiazem (CARDIZEM CD) 180 MG 24 hr capsule Take 1 capsule (180 mg total) by mouth daily. 60 capsule 7  . fenofibrate 160 MG tablet Take 160 mg by mouth daily.     . hydrochlorothiazide (HYDRODIURIL) 25 MG tablet Take 1 tablet (25 mg total) by mouth daily. 90 tablet 2  . metFORMIN (GLUCOPHAGE-XR) 500 MG 24 hr tablet 1000 MG TAKE IN THE EVENING    . Multiple Vitamins-Minerals (PRESERVISION AREDS 2 PO) Take by mouth 2 (two) times daily.    Marland Kitchen omega-3 acid ethyl esters (LOVAZA) 1 G capsule Take 2 g by mouth 2 (two) times daily.     Marland Kitchen omeprazole (PRILOSEC OTC) 20 MG tablet Take 20 mg by mouth daily.    . propranolol (INDERAL) 20 MG tablet Take 20 mg by mouth as needed.     . propranolol (INDERAL) 80 MG tablet Take 80 mg by mouth daily. Extended release one tab every evening    . ramipril (ALTACE) 10 MG capsule Take 10 mg by mouth daily.     . rosuvastatin (CRESTOR) 10 MG tablet Take 1 tablet (10 mg total) by mouth daily. 30 tablet 5   No current facility-administered medications on file prior to visit.   Allergies  Allergen Reactions  . Demerol [Meperidine] Nausea And Vomiting   No Change in Social and Family History  ROS: A comprehensive Review of Systems - Was reviewed Review of Systems  Constitutional: Negative for weight loss and malaise/fatigue.  Respiratory: Negative for cough and sputum production.   Cardiovascular: Negative for claudication and leg swelling.  Gastrointestinal: Negative for heartburn, blood in stool and melena.    Genitourinary: Negative for hematuria.  Musculoskeletal: Positive for joint pain. Negative for myalgias.  Neurological: Negative for dizziness, loss of consciousness and headaches.  Endo/Heme/Allergies: Negative.   Psychiatric/Behavioral: The patient is nervous/anxious (She's had lots of stress over the last 6 months).   All other systems reviewed and are negative.   Wt Readings from Last 3 Encounters:  09/16/14 144 lb 8 oz (65.545 kg)  03/20/14 149 lb (67.586 kg)  03/17/14 148 lb 1.6 oz (67.178 kg)    PHYSICAL EXAM BP 122/74 mmHg  Pulse 66  Ht 5\' 3"  (1.6 m)  Wt 144 lb 8 oz (65.545 kg)  BMI 25.60 kg/m2 General: alert, cooperative, appears stated age, no distress and well-nourished/well groomed. Pleasant mood and affect.  HEENT:  [place]/AT, EOMI, MMM, anicteric sclera  Neck: no adenopathy, no carotid bruit, no JVD, supple, symmetrical, trachea midline and thyroid not enlarged, symmetric, no tenderness/mass/nodules  Lungs: clear to auscultation bilaterally, normal percussion bilaterally and nonlabored, good air movement  Heart: RRR, normal S1 with split S2. Non-displaced PMI. Mild 1/6 SEM at RUSB. Otherwise no other M/R./G.  Abdomen: soft, non-tender; bowel sounds normal; no masses, no organomegaly  Extremities:no C./C./E.; Pulses: 2+ and symmetric  Neurologic: Alert and oriented X 3, normal strength and tone. Normal symmetric reflexes. Normal coordination and gait   Adult ECG Report  NSR, 66 bpm, LBBB with leftward axis. Stable EKG  Recent Labs reviewed in Epic:   No recent labs.  ASSESSMENT / PLAN: SVT (supraventricular tachycardia) Very well controlled with combination of diltiazem and Inderal. She uses when necessary low dose Inderal for breakthrough episodes but really has minimal breakthrough episodes. She is no longer overly symptomatic.   Mixed hyperlipidemia: High triglycerides, low HDL She was a little disappointed when she went to our lipid clinic because she was told  to reduce her actions such in and had different dietary directions given to her. Unfortunately when the pharmacist that was running the clinic left, there was no followup with the expected lab work. She is due to have labs checked by her PCP soon. I've asked that we get him results of her lipid panel from her PCP or the endocrinologist. This was she only has lab drawn once instead of more than once. She remains on fenofibrate, Crestor and Lovaza. (there was a plan established that was not followed through on)   Renal artery stenosis She had a reported history of unilateral renal artery stenosis but by the last abdominal Doppler there was no suggestion of of this. Just for confirmation Will recheck another Doppler this year. If there is no evidence of stenosis I would cancel the followup studies as she doesn't have hypertension related to renal artery stenosis.   Essential hypertension Very stable blood pressure today on ACE inhibitor, beta blocker and calcium channel blocker      Orders Placed This Encounter  Procedures  . Lipid panel    Order Specific Question:  Has the patient fasted?    Answer:  Yes  . Comprehensive metabolic panel    Order Specific Question:  Has the patient fasted?    Answer:  Yes  . EKG 12-Lead  . Renal Artery Duplex Bilateral    Standing Status: Future     Number of Occurrences:      Standing Expiration Date: 09/16/2015    Order Specific Question:  Laterality    Answer:  Bilateral    Order Specific Question:  Where should this test be performed:    Answer:  [facility]   Meds ordered this encounter  Medications  . glimepiride (AMARYL) 4 MG tablet    Sig: Take 1 tablet by mouth daily.    Refill:  5   Although not limiting purpose of her visit, over half of the time of the consultation was spent discussing her labile lipids and glycemic control. I provided guidance as to how to handle her concerns in this situation and what she felt like she was "not  well handled".  I spentclose to 30-35 minutes with the patient, over 50% was spent discussing options and goals.  Followup: 12  months  Isabella Wright W. Ellyn Hack, M.D., M.S. Interventional Cardiologist [facility]

## 2014-09-16 NOTE — Patient Instructions (Addendum)
LABS IN MAY 2016 Your physician has requested that you have a renal artery duplex. During this test, an ultrasound is used to evaluate blood flow to the kidneys. Allow one hour for this exam. Do not eat after midnight the day before and avoid carbonated beverages. Take your medications as you usually do.    Your physician wants you to follow-up in [place]. [place] will receive a reminder letter in the mail two months in advance. If you don't receive a letter, please call our office to schedule the follow-up appointment.

## 2014-09-17 ENCOUNTER — Encounter: Payer: Self-pay | Admitting: Cardiology

## 2014-09-17 DIAGNOSIS — E1165 Type 2 diabetes mellitus with hyperglycemia: Secondary | ICD-10-CM | POA: Diagnosis not present

## 2014-09-17 NOTE — Assessment & Plan Note (Signed)
Very well controlled with combination of diltiazem and Inderal. She uses when necessary low dose Inderal for breakthrough episodes but really has minimal breakthrough episodes. She is no longer overly symptomatic.

## 2014-09-17 NOTE — Assessment & Plan Note (Addendum)
She was a little disappointed when she went to our lipid clinic because she was told to reduce her actions such in and had different dietary directions given to her. Unfortunately when the pharmacist that was running the clinic left, there was no followup with the expected lab work. She is due to have labs checked by her PCP soon. I've asked that we get him results of her lipid panel from her PCP or the endocrinologist. This was she only has lab drawn once instead of more than once. She remains on fenofibrate, Crestor and Lovaza. (there was a plan established that was not followed through on)

## 2014-09-17 NOTE — Assessment & Plan Note (Signed)
She had a reported history of unilateral renal artery stenosis but by the last abdominal Doppler there was no suggestion of of this. Just for confirmation Will recheck another Doppler this year. If there is no evidence of stenosis I would cancel the followup studies as she doesn't have hypertension related to renal artery stenosis.

## 2014-09-17 NOTE — Assessment & Plan Note (Addendum)
Very stable blood pressure today on ACE inhibitor, beta blocker and calcium channel blocker

## 2014-10-09 DIAGNOSIS — E1165 Type 2 diabetes mellitus with hyperglycemia: Secondary | ICD-10-CM | POA: Diagnosis not present

## 2014-10-22 DIAGNOSIS — H3531 Nonexudative age-related macular degeneration: Secondary | ICD-10-CM | POA: Diagnosis not present

## 2014-11-19 DIAGNOSIS — Z85820 Personal history of malignant melanoma of skin: Secondary | ICD-10-CM | POA: Diagnosis not present

## 2014-11-19 DIAGNOSIS — C44799 Other specified malignant neoplasm of skin of left lower limb, including hip: Secondary | ICD-10-CM | POA: Diagnosis not present

## 2014-11-21 DIAGNOSIS — H3531 Nonexudative age-related macular degeneration: Secondary | ICD-10-CM | POA: Diagnosis not present

## 2014-12-01 DIAGNOSIS — E781 Pure hyperglyceridemia: Secondary | ICD-10-CM | POA: Diagnosis not present

## 2014-12-01 DIAGNOSIS — E1165 Type 2 diabetes mellitus with hyperglycemia: Secondary | ICD-10-CM | POA: Diagnosis not present

## 2014-12-01 DIAGNOSIS — Z6826 Body mass index (BMI) 26.0-26.9, adult: Secondary | ICD-10-CM | POA: Diagnosis not present

## 2014-12-01 DIAGNOSIS — I10 Essential (primary) hypertension: Secondary | ICD-10-CM | POA: Diagnosis not present

## 2014-12-01 DIAGNOSIS — E785 Hyperlipidemia, unspecified: Secondary | ICD-10-CM | POA: Diagnosis not present

## 2014-12-17 DIAGNOSIS — E1165 Type 2 diabetes mellitus with hyperglycemia: Secondary | ICD-10-CM | POA: Diagnosis not present

## 2014-12-18 ENCOUNTER — Other Ambulatory Visit: Payer: Self-pay | Admitting: Cardiology

## 2014-12-18 NOTE — Telephone Encounter (Signed)
Rx(s) sent to pharmacy electronically.  

## 2014-12-19 ENCOUNTER — Other Ambulatory Visit: Payer: Self-pay | Admitting: Internal Medicine

## 2014-12-19 DIAGNOSIS — Z1231 Encounter for screening mammogram for malignant neoplasm of breast: Secondary | ICD-10-CM

## 2014-12-23 DIAGNOSIS — Z1231 Encounter for screening mammogram for malignant neoplasm of breast: Secondary | ICD-10-CM | POA: Diagnosis not present

## 2015-01-09 DIAGNOSIS — R922 Inconclusive mammogram: Secondary | ICD-10-CM | POA: Diagnosis not present

## 2015-01-09 DIAGNOSIS — R928 Other abnormal and inconclusive findings on diagnostic imaging of breast: Secondary | ICD-10-CM | POA: Diagnosis not present

## 2015-01-22 ENCOUNTER — Telehealth (HOSPITAL_COMMUNITY): Payer: Self-pay | Admitting: *Deleted

## 2015-01-23 ENCOUNTER — Other Ambulatory Visit: Payer: Self-pay | Admitting: Cardiology

## 2015-01-23 DIAGNOSIS — I701 Atherosclerosis of renal artery: Secondary | ICD-10-CM

## 2015-02-18 ENCOUNTER — Ambulatory Visit (HOSPITAL_COMMUNITY)
Admission: RE | Admit: 2015-02-18 | Discharge: 2015-02-18 | Disposition: A | Discharge status: Home / Self Care | Payer: Medicare Other | Source: Ambulatory Visit | Attending: Cardiovascular Disease | Admitting: Cardiovascular Disease

## 2015-02-18 DIAGNOSIS — I701 Atherosclerosis of renal artery: Secondary | ICD-10-CM | POA: Diagnosis not present

## 2015-02-22 DIAGNOSIS — H3531 Nonexudative age-related macular degeneration: Secondary | ICD-10-CM | POA: Diagnosis not present

## 2015-02-23 ENCOUNTER — Telehealth: Payer: Self-pay | Admitting: *Deleted

## 2015-02-23 NOTE — Telephone Encounter (Signed)
-----   Message from Leonie Man, MD sent at 02/22/2015 12:11 PM EDT ----- Abdominal Aortic doppler - normal R renal Artery with moderate < 60% narrowing.  Not enough to consider intervention & not likely to cause HTN.  HARDING, DAVID W

## 2015-02-23 NOTE — Telephone Encounter (Signed)
Spoke to patient. Result given . Verbalized understanding  

## 2015-02-25 DIAGNOSIS — Z1211 Encounter for screening for malignant neoplasm of colon: Secondary | ICD-10-CM | POA: Diagnosis not present

## 2015-02-25 DIAGNOSIS — Z79899 Other long term (current) drug therapy: Secondary | ICD-10-CM | POA: Diagnosis not present

## 2015-02-25 DIAGNOSIS — Z885 Allergy status to narcotic agent status: Secondary | ICD-10-CM | POA: Diagnosis not present

## 2015-02-25 DIAGNOSIS — D123 Benign neoplasm of transverse colon: Secondary | ICD-10-CM | POA: Diagnosis not present

## 2015-02-25 DIAGNOSIS — D124 Benign neoplasm of descending colon: Secondary | ICD-10-CM | POA: Diagnosis not present

## 2015-02-25 DIAGNOSIS — I10 Essential (primary) hypertension: Secondary | ICD-10-CM | POA: Diagnosis not present

## 2015-02-25 DIAGNOSIS — E785 Hyperlipidemia, unspecified: Secondary | ICD-10-CM | POA: Diagnosis not present

## 2015-02-25 DIAGNOSIS — Z794 Long term (current) use of insulin: Secondary | ICD-10-CM | POA: Diagnosis not present

## 2015-02-25 DIAGNOSIS — K5730 Diverticulosis of large intestine without perforation or abscess without bleeding: Secondary | ICD-10-CM | POA: Diagnosis not present

## 2015-02-25 DIAGNOSIS — E119 Type 2 diabetes mellitus without complications: Secondary | ICD-10-CM | POA: Diagnosis not present

## 2015-02-25 LAB — HM COLONOSCOPY

## 2015-03-24 DIAGNOSIS — E1165 Type 2 diabetes mellitus with hyperglycemia: Secondary | ICD-10-CM | POA: Diagnosis not present

## 2015-03-24 DIAGNOSIS — H3531 Nonexudative age-related macular degeneration: Secondary | ICD-10-CM | POA: Diagnosis not present

## 2015-04-07 DIAGNOSIS — H43823 Vitreomacular adhesion, bilateral: Secondary | ICD-10-CM | POA: Diagnosis not present

## 2015-04-07 DIAGNOSIS — H3531 Nonexudative age-related macular degeneration: Secondary | ICD-10-CM | POA: Diagnosis not present

## 2015-04-07 DIAGNOSIS — E119 Type 2 diabetes mellitus without complications: Secondary | ICD-10-CM | POA: Diagnosis not present

## 2015-04-07 DIAGNOSIS — H35423 Microcystoid degeneration of retina, bilateral: Secondary | ICD-10-CM | POA: Diagnosis not present

## 2015-04-23 DIAGNOSIS — H353132 Nonexudative age-related macular degeneration, bilateral, intermediate dry stage: Secondary | ICD-10-CM | POA: Diagnosis not present

## 2015-04-29 ENCOUNTER — Other Ambulatory Visit: Payer: Self-pay | Admitting: *Deleted

## 2015-04-29 MED ORDER — DILTIAZEM HCL ER COATED BEADS 180 MG PO CP24: 180.0000 mg | ORAL_CAPSULE | Freq: Every day | ORAL | Status: DC

## 2015-04-29 NOTE — Telephone Encounter (Signed)
Rx(s) sent to pharmacy electronically.  

## 2015-05-23 DIAGNOSIS — H353132 Nonexudative age-related macular degeneration, bilateral, intermediate dry stage: Secondary | ICD-10-CM | POA: Diagnosis not present

## 2015-05-25 DIAGNOSIS — J029 Acute pharyngitis, unspecified: Secondary | ICD-10-CM | POA: Diagnosis not present

## 2015-05-25 DIAGNOSIS — Z6824 Body mass index (BMI) 24.0-24.9, adult: Secondary | ICD-10-CM | POA: Diagnosis not present

## 2015-05-29 DIAGNOSIS — E785 Hyperlipidemia, unspecified: Secondary | ICD-10-CM | POA: Diagnosis not present

## 2015-05-29 DIAGNOSIS — I10 Essential (primary) hypertension: Secondary | ICD-10-CM | POA: Diagnosis not present

## 2015-05-29 DIAGNOSIS — E784 Other hyperlipidemia: Secondary | ICD-10-CM | POA: Diagnosis not present

## 2015-06-01 DIAGNOSIS — E1165 Type 2 diabetes mellitus with hyperglycemia: Secondary | ICD-10-CM | POA: Diagnosis not present

## 2015-06-03 DIAGNOSIS — C44799 Other specified malignant neoplasm of skin of left lower limb, including hip: Secondary | ICD-10-CM | POA: Diagnosis not present

## 2015-06-03 DIAGNOSIS — Z08 Encounter for follow-up examination after completed treatment for malignant neoplasm: Secondary | ICD-10-CM | POA: Diagnosis not present

## 2015-06-03 DIAGNOSIS — C4499 Other specified malignant neoplasm of skin, unspecified: Secondary | ICD-10-CM | POA: Diagnosis not present

## 2015-06-03 DIAGNOSIS — Z9889 Other specified postprocedural states: Secondary | ICD-10-CM | POA: Diagnosis not present

## 2015-06-05 DIAGNOSIS — Z1389 Encounter for screening for other disorder: Secondary | ICD-10-CM | POA: Diagnosis not present

## 2015-06-05 DIAGNOSIS — E784 Other hyperlipidemia: Secondary | ICD-10-CM | POA: Diagnosis not present

## 2015-06-05 DIAGNOSIS — Z0000 Encounter for general adult medical examination without abnormal findings: Secondary | ICD-10-CM | POA: Diagnosis not present

## 2015-06-05 DIAGNOSIS — Z86010 Personal history of colonic polyps: Secondary | ICD-10-CM | POA: Diagnosis not present

## 2015-06-05 DIAGNOSIS — Z23 Encounter for immunization: Secondary | ICD-10-CM | POA: Diagnosis not present

## 2015-06-05 DIAGNOSIS — C4499 Other specified malignant neoplasm of skin, unspecified: Secondary | ICD-10-CM | POA: Diagnosis not present

## 2015-06-05 DIAGNOSIS — E781 Pure hyperglyceridemia: Secondary | ICD-10-CM | POA: Diagnosis not present

## 2015-06-05 DIAGNOSIS — I701 Atherosclerosis of renal artery: Secondary | ICD-10-CM | POA: Diagnosis not present

## 2015-06-05 DIAGNOSIS — I10 Essential (primary) hypertension: Secondary | ICD-10-CM | POA: Diagnosis not present

## 2015-06-05 DIAGNOSIS — E119 Type 2 diabetes mellitus without complications: Secondary | ICD-10-CM | POA: Diagnosis not present

## 2015-06-15 ENCOUNTER — Other Ambulatory Visit: Payer: Self-pay | Admitting: Cardiology

## 2015-06-15 NOTE — Telephone Encounter (Signed)
Rx request sent to pharmacy.  

## 2015-06-22 ENCOUNTER — Other Ambulatory Visit: Payer: Self-pay | Admitting: Cardiology

## 2015-06-22 DIAGNOSIS — H353132 Nonexudative age-related macular degeneration, bilateral, intermediate dry stage: Secondary | ICD-10-CM | POA: Diagnosis not present

## 2015-06-22 NOTE — Telephone Encounter (Signed)
REFILL 

## 2015-06-23 DIAGNOSIS — E785 Hyperlipidemia, unspecified: Secondary | ICD-10-CM | POA: Diagnosis not present

## 2015-06-23 DIAGNOSIS — E1169 Type 2 diabetes mellitus with other specified complication: Secondary | ICD-10-CM | POA: Diagnosis not present

## 2015-06-29 DIAGNOSIS — H353123 Nonexudative age-related macular degeneration, left eye, advanced atrophic without subfoveal involvement: Secondary | ICD-10-CM | POA: Diagnosis not present

## 2015-06-29 DIAGNOSIS — H353114 Nonexudative age-related macular degeneration, right eye, advanced atrophic with subfoveal involvement: Secondary | ICD-10-CM | POA: Diagnosis not present

## 2015-07-06 ENCOUNTER — Other Ambulatory Visit: Payer: Self-pay | Admitting: Cardiology

## 2015-07-07 NOTE — Telephone Encounter (Signed)
Rx(s) sent to pharmacy electronically.  

## 2015-07-19 HISTORY — PX: CARDIAC ELECTROPHYSIOLOGY MAPPING AND ABLATION: SHX1292

## 2015-07-22 DIAGNOSIS — H353132 Nonexudative age-related macular degeneration, bilateral, intermediate dry stage: Secondary | ICD-10-CM | POA: Diagnosis not present

## 2015-07-28 ENCOUNTER — Telehealth: Payer: Self-pay | Admitting: Cardiology

## 2015-07-28 MED ORDER — PROPRANOLOL HCL 20 MG PO TABS: 20.0000 mg | ORAL_TABLET | ORAL | Status: DC | PRN

## 2015-07-28 NOTE — Telephone Encounter (Signed)
°*  STAT* If patient is at the pharmacy, call can be transferred to refill team.   1. Which medications need to be refilled? (please list name of each medication and dose if known) Propranolol 20 mg-need new prescription 2. Which pharmacy/location (including street and city if local pharmacy) is medication to be sent to?[id number]  3. Do they need a 30 day or 90 day supply? 90 and refills

## 2015-07-28 NOTE — Telephone Encounter (Signed)
Refill sent to the pharmacy electronically.  

## 2015-08-05 DIAGNOSIS — E1169 Type 2 diabetes mellitus with other specified complication: Secondary | ICD-10-CM | POA: Diagnosis not present

## 2015-08-05 DIAGNOSIS — E785 Hyperlipidemia, unspecified: Secondary | ICD-10-CM | POA: Diagnosis not present

## 2015-08-18 DIAGNOSIS — Z23 Encounter for immunization: Secondary | ICD-10-CM | POA: Diagnosis not present

## 2015-08-21 DIAGNOSIS — H353132 Nonexudative age-related macular degeneration, bilateral, intermediate dry stage: Secondary | ICD-10-CM | POA: Diagnosis not present

## 2015-08-24 DIAGNOSIS — E1165 Type 2 diabetes mellitus with hyperglycemia: Secondary | ICD-10-CM | POA: Diagnosis not present

## 2015-08-24 DIAGNOSIS — Z794 Long term (current) use of insulin: Secondary | ICD-10-CM | POA: Diagnosis not present

## 2015-09-03 DIAGNOSIS — H353123 Nonexudative age-related macular degeneration, left eye, advanced atrophic without subfoveal involvement: Secondary | ICD-10-CM | POA: Diagnosis not present

## 2015-09-10 DIAGNOSIS — H353133 Nonexudative age-related macular degeneration, bilateral, advanced atrophic without subfoveal involvement: Secondary | ICD-10-CM | POA: Diagnosis not present

## 2015-09-20 DIAGNOSIS — H353132 Nonexudative age-related macular degeneration, bilateral, intermediate dry stage: Secondary | ICD-10-CM | POA: Diagnosis not present

## 2015-10-08 DIAGNOSIS — E1165 Type 2 diabetes mellitus with hyperglycemia: Secondary | ICD-10-CM | POA: Diagnosis not present

## 2015-10-08 DIAGNOSIS — Z794 Long term (current) use of insulin: Secondary | ICD-10-CM | POA: Diagnosis not present

## 2015-10-20 DIAGNOSIS — H353221 Exudative age-related macular degeneration, left eye, with active choroidal neovascularization: Secondary | ICD-10-CM | POA: Diagnosis not present

## 2015-10-20 DIAGNOSIS — H353114 Nonexudative age-related macular degeneration, right eye, advanced atrophic with subfoveal involvement: Secondary | ICD-10-CM | POA: Diagnosis not present

## 2015-11-16 DIAGNOSIS — Z794 Long term (current) use of insulin: Secondary | ICD-10-CM | POA: Diagnosis not present

## 2015-11-16 DIAGNOSIS — E1165 Type 2 diabetes mellitus with hyperglycemia: Secondary | ICD-10-CM | POA: Diagnosis not present

## 2015-11-17 DIAGNOSIS — H353221 Exudative age-related macular degeneration, left eye, with active choroidal neovascularization: Secondary | ICD-10-CM | POA: Diagnosis not present

## 2015-12-09 DIAGNOSIS — C44799 Other specified malignant neoplasm of skin of left lower limb, including hip: Secondary | ICD-10-CM | POA: Diagnosis not present

## 2015-12-09 DIAGNOSIS — Z85828 Personal history of other malignant neoplasm of skin: Secondary | ICD-10-CM | POA: Diagnosis not present

## 2015-12-09 DIAGNOSIS — Z08 Encounter for follow-up examination after completed treatment for malignant neoplasm: Secondary | ICD-10-CM | POA: Diagnosis not present

## 2015-12-11 DIAGNOSIS — R918 Other nonspecific abnormal finding of lung field: Secondary | ICD-10-CM | POA: Diagnosis not present

## 2015-12-11 DIAGNOSIS — C499 Malignant neoplasm of connective and soft tissue, unspecified: Secondary | ICD-10-CM | POA: Diagnosis not present

## 2015-12-16 DIAGNOSIS — H353114 Nonexudative age-related macular degeneration, right eye, advanced atrophic with subfoveal involvement: Secondary | ICD-10-CM | POA: Diagnosis not present

## 2015-12-16 DIAGNOSIS — H353221 Exudative age-related macular degeneration, left eye, with active choroidal neovascularization: Secondary | ICD-10-CM | POA: Diagnosis not present

## 2015-12-17 HISTORY — PX: TRANSTHORACIC ECHOCARDIOGRAM: SHX275

## 2015-12-17 HISTORY — PX: NM MYOVIEW LTD: HXRAD82

## 2016-01-01 ENCOUNTER — Telehealth: Payer: Self-pay | Admitting: Cardiology

## 2016-01-01 NOTE — Telephone Encounter (Signed)
Received records from [facility] for appointment on 01/04/16 with Dr Ellyn Hack.  Records given to [facility] (medical records) for Dr Allison Quarry schedule on 01/04/16. lp

## 2016-01-04 ENCOUNTER — Ambulatory Visit (INDEPENDENT_AMBULATORY_CARE_PROVIDER_SITE_OTHER): Payer: Medicare Other | Admitting: Cardiology

## 2016-01-04 ENCOUNTER — Encounter: Payer: Self-pay | Admitting: Cardiology

## 2016-01-04 ENCOUNTER — Encounter (INDEPENDENT_AMBULATORY_CARE_PROVIDER_SITE_OTHER): Payer: Medicare Other

## 2016-01-04 VITALS — BP 146/93 | HR 129 | Ht 63.0 in | Wt 145.8 lb

## 2016-01-04 DIAGNOSIS — R0602 Shortness of breath: Secondary | ICD-10-CM

## 2016-01-04 DIAGNOSIS — R001 Bradycardia, unspecified: Secondary | ICD-10-CM | POA: Diagnosis not present

## 2016-01-04 DIAGNOSIS — I447 Left bundle-branch block, unspecified: Secondary | ICD-10-CM | POA: Diagnosis not present

## 2016-01-04 DIAGNOSIS — Z8679 Personal history of other diseases of the circulatory system: Secondary | ICD-10-CM

## 2016-01-04 DIAGNOSIS — I4891 Unspecified atrial fibrillation: Secondary | ICD-10-CM

## 2016-01-04 DIAGNOSIS — R002 Palpitations: Secondary | ICD-10-CM | POA: Diagnosis not present

## 2016-01-04 DIAGNOSIS — I10 Essential (primary) hypertension: Secondary | ICD-10-CM

## 2016-01-04 HISTORY — DX: Personal history of other diseases of the circulatory system: Z86.79

## 2016-01-04 MED ORDER — RIVAROXABAN 20 MG PO TABS: 20.0000 mg | ORAL_TABLET | Freq: Every day | ORAL | Status: DC

## 2016-01-04 NOTE — Progress Notes (Signed)
PCP: Velna Hatchet, MD  Clinic Note: Chief Complaint  Patient presents with  . Annual Exam    SOB; randomly  . Tachycardia    HPI: Isabella Wright is a 70 y.o. female with a PMH below who presents today for 15 month follow-up. She has a history of PSVT. 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 She is a widow. Her husband is a retired Publishing rights manager. She moved back 9, in 2016 to be close to her family. She has hypercholesterolemia/hypertriglyceridemia and history of SVT.  Isabella Wright was last seen on March 2016  Recent Hospitalizations: n/a  Studies Reviewed: none  Interval History: Isabella Wright presents back here today for delayed follow-up, mostly because she has been having episodes of dyspnea and sensation of irregular heartbeats. She has noticed that taking the long-acting propanolol, her heart rate related them to the 40s at night, says she therefore stopped that. She also felt very short of breath with that. She's been using when necessary propranolol and standing dose of diltiazem. She really doesn't know smokes no a chest discomfort except for when her heart rate is going really fast or slow. But she seems to be more short of breath than usual. She says that it makes it so that she has a hard time doing any activity. She doesn't really notice the heart rate being irregular unless is quite fast. She denies any TIA or amaurosis fugax symptoms. No anginal type chest pain with rest or exertion, but does have exertional dyspnea if she is noting an arrhythmia. She has not noted any TIA or amaurosis fugax symptoms.  Thankfully, one her heart racing fast, she is still in relatively decent blood pressure. For the most part the frontal tendons are a little breakdown. She does not notice that her heart is going overly fast today, but does feel a little dyspneic.  No PND, orthopnea or edema.  No syncope/near  syncope. No TIA/amaurosis fugax symptoms. No melena, hematochezia, hematuria, or epstaxis. No claudication.  ROS: A comprehensive was performed. Review of Systems  Constitutional: Positive for malaise/fatigue. Negative for fever and chills.  Eyes: Positive for blurred vision.  Respiratory: Positive for shortness of breath.   Cardiovascular: Negative for claudication.  Gastrointestinal: Negative for blood in stool.  Genitourinary: Negative for hematuria.  Musculoskeletal: Positive for back pain and joint pain. Negative for myalgias and falls.  Neurological: Positive for dizziness. Negative for headaches.  Psychiatric/Behavioral: Negative for memory loss. The patient is nervous/anxious (Very tearful today having seen her EKG showing atrial fibrillation -he remembers her husband having a chill. Just before it got very very sick.). The patient does not have insomnia.   All other systems reviewed and are negative.    Past Medical History  Diagnosis Date  . Hypertriglyceridemia 1996    Very labile and difficult control: Began in '96 when she began taking Premarin after hysterectomy -- triglycerides increased to 3582 neuropathy in her feet> care for by endocrinologis; levels vary based on stress.;; Labs from March 2015: TC 150, TG 745, HDL 26, LDL 45  . Hypertension 2000    2009: Renal Dopplers showed R RA ostial stenosis -- (told not a problem or 20 yrs)  . Diabetes mellitus type 2, controlled ([place]) 2012  . Paroxysmal SVT (supraventricular tachycardia) ([place]) 2009    less frequent following I&D of chest wall abscess (Jan 015)  . LBBB (left bundle branch block)  2009    Initially diagnosed as rate related during stress echo.  . Macular degeneration     Dry  . New onset atrial fibrillation ([place]) 01/04/2016    This patients CHA2DS2-VASc Score and unadjusted Ischemic Stroke Rate (% per year) is equal to 4.8 % stroke rate/year from a score of 4 -- 1 point each: HTN, DM, Age if 64-74, or Female]        Past Surgical History  Procedure Laterality Date  . Transthoracic echocardiogram  2013    normal  . Transthoracic echocardiogram  March 2015    aortic sclerosis; without stenosis. Normal EF  . Treadmill stress echo  01/2009    No regional wall motion abnormalities with stress == non-ischemic; rate related incomplete left bundle branch block  . Appendectomy  1968  . Cystoscopy/retrograde/ureteroscopy/stone extraction with basket  1972  . Excisional hemorrhoidectomy   1981  . Total abdominal hysterectomy w/ bilateral salpingoophorectomy  1994  . Cystoscopy w/ ureteroscopy w/ lithotripsy   1999    Large calcium oxalate stone  . Orif proximal tibial plateau fracture Left 2007    , Related shattered left tibial plateau with reattachment of torn ligaments and insertion of titanium plate and screws.  . Abdominal doppler ultrasound  February 2009     Right ostial renal artery stenosis; normal renal structure.  . Abcess drainage  January 2015     chest wall; with antibiotics --> following this treatment, SVT episodes became much less frequent.  . Renal artery duplex  01/02/2014    R&L RA - mildly elevated velocities - < 60%; Bilateral Kidneys - normal shape & size.    Prior to Admission medications   Medication Sig Start Date End Date Taking? Authorizing Provider  cholecalciferol (VITAMIN D) 1000 UNITS tablet Take 1,000 Units by mouth daily.   Yes Historical Provider, MD  diltiazem (CARDIZEM CD) 180 MG 24 hr capsule TAKE 1 CAPSULE (180 MG TOTAL) BY MOUTH DAILY. 06/22/15  Yes Leonie Man, MD  Dulaglutide (TRULICITY) A999333 [id number] SOPN Inject into the skin. 11/16/15  Yes Historical Provider, MD  fenofibrate 160 MG tablet Take 160 mg by mouth daily.  11/06/13  Yes Historical Provider, MD  glimepiride (AMARYL) 4 MG tablet Take 1 tablet by mouth daily. 08/20/14  Yes Historical Provider, MD  hydrochlorothiazide (HYDRODIURIL) 25 MG tablet TAKE 1 TABLET (25 MG TOTAL) BY MOUTH DAILY. 07/07/15  Yes  Leonie Man, MD  Insulin Detemir (LEVEMIR FLEXTOUCH) 100 UNIT/ML Pen Inject into the skin. 08/31/15  Yes Historical Provider, MD  metFORMIN (GLUCOPHAGE-XR) 500 MG 24 hr tablet 1000 MG TAKE IN THE EVENING 11/27/13  Yes Historical Provider, MD  Multiple Vitamins-Minerals (PRESERVISION AREDS 2 PO) Take by mouth 2 (two) times daily.   Yes Historical Provider, MD  omega-3 acid ethyl esters (LOVAZA) 1 G capsule Take 2 g by mouth 2 (two) times daily.  10/09/13  Yes Historical Provider, MD  omeprazole (PRILOSEC OTC) 20 MG tablet Take 20 mg by mouth daily.   Yes Historical Provider, MD  propranolol (INDERAL) 20 MG tablet Take 1 tablet (20 mg total) by mouth as needed. 07/28/15  Yes Leonie Man, MD  ramipril (ALTACE) 10 MG capsule Take 10 mg by mouth daily.  09/02/13  Yes Historical Provider, MD  rosuvastatin (CRESTOR) 10 MG tablet TAKE 1 TABLET (10 MG TOTAL) BY MOUTH DAILY. 06/15/15  Yes Leonie Man, MD   Allergies  Allergen Reactions  . Exenatide Nausea And Vomiting  . Liraglutide Nausea  And Vomiting  . Demerol [Meperidine] Nausea And Vomiting    Social History   Social History  . Marital Status: Widowed    Spouse Name: N/A  . Number of Children: N/A  . Years of Education: N/A   Social History Main Topics  . Smoking status: Former Smoker    Quit date: 11/29/1983  . Smokeless tobacco: None     Comment: WAS A SOCIAL SMOKER  . Alcohol Use: No  . Drug Use: No  . Sexual Activity: Not Asked   Other Topics Concern  . 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 She is now widowed for just under 2 years. Her husband died of cancer. They do not have any children.   She recently (spring of 2015) moved to [place] to be closer to her brother, [name] and his family.   She been the caregiver for her ailing husband for 5 years. He passed away in 27-Feb-2012, and she has had prolonged period of grieving partly due to her having lost 3  members of her immediate family during that time period as well.  --- She is under a lot of stress      She is a former smoker quit years ago. She does not drink alcohol. She is active, but not routine exercise. She says that she tries to eat healthy and knows what usually affects her triglyceride levels.    Family History  Problem Relation Age of Onset  . Heart disease Brother     Died at 43  . Heart disease Mother     Died at 36  . Heart disease Father     Died at 87  . Breast cancer Sister     Died at 71    Wt Readings from Last 3 Encounters:  01/04/16 145 lb 12.8 oz (66.134 kg)  09/16/14 144 lb 8 oz (65.545 kg)  03/20/14 149 lb (67.586 kg)    PHYSICAL EXAM BP 146/93 mmHg  Pulse 129  Ht 5\' 3"  (1.6 m)  Wt 145 lb 12.8 oz (66.134 kg)  BMI 25.83 kg/m2 General appearance: alert, cooperative, appears stated age, no distress and Well-nourished well-groomed. Somewhat anxious but otherwise pleasant mood and affect. Neck: no adenopathy, no carotid bruit and no JVD Lungs: clear to auscultation bilaterally, normal percussion bilaterally and non-labored Heart: Rapid, irregularly irregular heart rate and rhythm. 1-2/6 SEM at RUSB. Otherwise no M/R/GPMI. Abdomen: soft, non-tender; bowel sounds normal; no masses,  no organomegaly; no HJR Extremities: extremities normal, atraumatic, no cyanosis, and edema - trivial Pulses: 2+ and symmetric; Skin: Mild signs of venous stasis with spider veins on bilateral ankles.  Neurologic: Mental status: Alert, oriented, thought content appropriate Cranial nerves: normal (II-XII grossly intact)    Adult ECG Report  Rate: 129 ;  Rhythm: atrial fibrillation and With RVR. Left bundle branch type pattern IVCD, borderline leftward axis.;   Narrative Interpretation: A. fib with RVR is now apparent. Rate related changes make the bundle branch block less apparent.   Other studies Reviewed: Additional studies/ records that were reviewed today include:    Recent Labs:   No results found for: TSH    ASSESSMENT / PLAN: Problem List Items Addressed This Visit    New onset atrial fibrillation ([place]) - Primary    Not previously chilly diagnosed. There is been several times that she's had some type of tachyarrhythmia, but was thought to be SVT. Now clearly  in A. fib RVR. She is not overly somatic, but does have some dyspnea. I think she probably does have prolonged paroxysmal times a day A. fib but spontaneously resolved. If she becomes overly somatic, I discussed with her that we may need to think about TEE cardioversion, but for now the plan will be to starT full anticoagulation with Xarelto - she is aware of the DOAC's and would prefer a DOAC to warfarin.  - This patients CHA2DS2-VASc Score and unadjusted Ischemic Stroke Rate (% per year) is equal to 4.8 % stroke rate/year from a score of 4   Plan: For now I will simply do rate control with the comminution beta blocker and ACE inhibitor. If she is needing to use a lot of the when necessary propranolol for breakthrough heart rates, I think I want her to be back on the long-acting atenolol.  30 day monitor to determine true A. fib burden and length of paroxysms.  Started Xarelto 20 mg daily  Continue current dose of diltiazem (she has not taken her dose at this morning which she just took. Also take when necessary atenolol this morning. Continues propanolol for when necessary rate control.  I would like for her to follow-up in the atrial fibrillation clinic (mostly because I will be out of town at the time of follow-up), but with her having combination of tachycardia and symptomatically bradycardia, she may be borderline tachybradycardia and out of prefer to have her evaluated by the electrophysiology is to determine if we try an antiarrhythmic agent or not.  Need to check an echocardiogram today shows no structural abnormalities  Myoview stress test to exclude CAD.        Relevant  Medications   rivaroxaban (XARELTO) 20 MG TABS tablet   Other Relevant Orders   EKG 12-Lead   Cardiac event monitor   ECHOCARDIOGRAM COMPLETE   Ambulatory referral to Cardiology   Myocardial Perfusion Imaging   LBBB (left bundle branch block) (Chronic)    Apparently she had some rate related left bundle-branch the past, that is now persistent. She's had a negative ischemic evaluation in the past, but now with new onset A. fib, I think we need to reassess for ischemia and/or structural abnormality. It is possible that she could have some potential cardiomyopathy set in between A. fib and LBBB.  Plan: Check echocardiogram and Lexiscan Myoview.      Relevant Medications   rivaroxaban (XARELTO) 20 MG TABS tablet   Other Relevant Orders   EKG 12-Lead   Cardiac event monitor   ECHOCARDIOGRAM COMPLETE   Ambulatory referral to Cardiology   Myocardial Perfusion Imaging   Essential hypertension (Chronic)    She has had stable blood pressure on her current regimen of beta blocker ACE inhibitor and calcium channel blocker. Pressure is low but high today, but she is very anxious. At least she does have blood pressure room and noted to use additional beta blocker. For now will make any changes until we determine ways to improve rate/rhythm control.      Relevant Medications   rivaroxaban (XARELTO) 20 MG TABS tablet   Bradycardia    In addition to having the A. fib with RVR, she has had recorded heart rates in the 40s at home albeit usually at night. They are symptomatic. Plan: 30 day event monitor. Follow-up in A. fib clinic with A. fib clinic in 3-4 weeks. Can determine if it is reasonable to consider cardioversion depending on A. fib burden on monitor.  We'll  need to monitor also for bradycardia as she may have tachybradycardia syndrome.      Relevant Orders   EKG 12-Lead   Cardiac event monitor   ECHOCARDIOGRAM COMPLETE   Ambulatory referral to Cardiology   Myocardial Perfusion Imaging     Other Visit Diagnoses    Palpitations        Relevant Orders    EKG 12-Lead    Cardiac event monitor    ECHOCARDIOGRAM COMPLETE    Ambulatory referral to Cardiology    Myocardial Perfusion Imaging    Shortness of breath        Relevant Orders    EKG 12-Lead    Cardiac event monitor    ECHOCARDIOGRAM COMPLETE    Ambulatory referral to Cardiology    Myocardial Perfusion Imaging       Current medicines are reviewed at length with the patient today. (+/- concerns) A. fib RVR concerns The following changes have been made:    START XARELTO 20 MG ONE TABLET DAILY WITH THE HEAVIEST MEAL (USUALLY SUPPER TIME )  GO TO HOSPITAL IF SYMPTOMS BECOME WORSE, CONTINUE TAKING PROPANOLOL AS NEEDED, IF SYMPTOMS OCCUR TOWARD THE EVENING TAKE LONG ACTING DOSE AT NIGHT.  Your physician recommends that you schedule a follow-up appointment in 3- 4 WEEKS WITH AFIB CLINIC -             ([place]     Studies Ordered:   Orders Placed This Encounter  Procedures  . Ambulatory referral to Cardiology  . Cardiac event monitor  . Myocardial Perfusion Imaging  . EKG 12-Lead  . ECHOCARDIOGRAM COMPLETE      Glenetta Hew, M.D., M.S. Interventional Cardiologist   Pager # [phone] Phone # [phone] [address] [place], [place] [address]

## 2016-01-04 NOTE — Patient Instructions (Addendum)
Your physician has recommended that you wear an event monitor FOR 30 DAYS. Event monitors are medical devices that record the heart's electrical activity. Doctors most often [name] these monitors to diagnose arrhythmias. Arrhythmias are problems with the speed or rhythm of the heartbeat. The monitor is a small, portable device. You can wear one while you do your normal daily activities. This is usually used to diagnose what is causing palpitations/syncope (passing out).      Your physician has requested that you have a lexiscan myoview. For further information please visit [email or web address]. Please follow instruction sheet, as given.    WILL BE SCHEDULE AT [place] Your physician has requested that you have an echocardiogram. Echocardiography is a painless test that uses sound waves to create images of your heart. It provides your doctor with information about the size and shape of your heart and how well your heart's chambers and valves are working. This procedure takes approximately one hour. There are no restrictions for this procedure.    START XARELTO 20 MG ONE TABLET DAILY WITH THE HEAVIEST MEAL (USUALLY SUPPER TIME )  GO TO HOSPITAL IF SYMPTOMS BECOME WORSE, CONTINUE TAKING PROPANOLOL AS NEEDED, IF SYMPTOMS OCCUR TOWARD THE EVENING TAKE LONG ACTING DOSE AT NIGHT.  Your physician recommends that you schedule a follow-up appointment in 3- [place] - ( [place]

## 2016-01-05 ENCOUNTER — Telehealth (HOSPITAL_COMMUNITY): Payer: Self-pay

## 2016-01-05 ENCOUNTER — Encounter: Payer: Self-pay | Admitting: Cardiology

## 2016-01-05 ENCOUNTER — Telehealth: Payer: Self-pay | Admitting: *Deleted

## 2016-01-05 DIAGNOSIS — I4891 Unspecified atrial fibrillation: Secondary | ICD-10-CM | POA: Insufficient documentation

## 2016-01-05 DIAGNOSIS — R001 Bradycardia, unspecified: Secondary | ICD-10-CM | POA: Insufficient documentation

## 2016-01-05 NOTE — Assessment & Plan Note (Addendum)
Not previously chilly diagnosed. There is been several times that she's had some type of tachyarrhythmia, but was thought to be SVT. Now clearly in A. fib RVR. She is not overly somatic, but does have some dyspnea. I think she probably does have prolonged paroxysmal times a day A. fib but spontaneously resolved. If she becomes overly somatic, I discussed with her that we may need to think about TEE cardioversion, but for now the plan will be to starT full anticoagulation with Xarelto - she is aware of the DOAC's and would prefer a DOAC to warfarin.  - This patients CHA2DS2-VASc Score and unadjusted Ischemic Stroke Rate (% per year) is equal to 4.8 % stroke rate/year from a score of 4   Plan: For now I will simply do rate control with the comminution beta blocker and ACE inhibitor. If she is needing to use a lot of the when necessary propranolol for breakthrough heart rates, I think I want her to be back on the long-acting atenolol.  30 day monitor to determine true A. fib burden and length of paroxysms.  Started Xarelto 20 mg daily  Continue current dose of diltiazem (she has not taken her dose at this morning which she just took. Also take when necessary atenolol this morning. Continues propanolol for when necessary rate control.  I would like for her to follow-up in the atrial fibrillation clinic (mostly because I will be out of town at the time of follow-up), but with her having combination of tachycardia and symptomatically bradycardia, she may be borderline tachybradycardia and out of prefer to have her evaluated by the electrophysiology is to determine if we try an antiarrhythmic agent or not.  Need to check an echocardiogram today shows no structural abnormalities  Myoview stress test to exclude CAD.

## 2016-01-05 NOTE — Telephone Encounter (Signed)
[facility] calling for new onset A Fib for this patient.  Rate reaching 160 sustained for 1 min - autodetected - no symptoms. They confirmed patient asymptomatic via phone call.  [facility] is in process of uploading and faxing the report. I confirmed fax number here and contact information for caller.

## 2016-01-05 NOTE — Assessment & Plan Note (Signed)
Apparently she had some rate related left bundle-branch the past, that is now persistent. She's had a negative ischemic evaluation in the past, but now with new onset A. fib, I think we need to reassess for ischemia and/or structural abnormality. It is possible that she could have some potential cardiomyopathy set in between A. fib and LBBB.  Plan: Check echocardiogram and Lexiscan Myoview.

## 2016-01-05 NOTE — Telephone Encounter (Signed)
Encounter complete. 

## 2016-01-05 NOTE — Telephone Encounter (Signed)
Got advice from Dr. Ellyn Hack - increase propranolol 20mg  to BID, take 1 extra 180mg  cardizem today and then resume regular written cardizem dosage.  Instructions were relayed to patient, who voiced understanding.  She is aware to call if new concerns or further questions.

## 2016-01-05 NOTE — Assessment & Plan Note (Signed)
She has had stable blood pressure on her current regimen of beta blocker ACE inhibitor and calcium channel blocker. Pressure is low but high today, but she is very anxious. At least she does have blood pressure room and noted to use additional beta blocker. For now will make any changes until we determine ways to improve rate/rhythm control.

## 2016-01-05 NOTE — Assessment & Plan Note (Signed)
In addition to having the A. fib with RVR, she has had recorded heart rates in the 40s at home albeit usually at night. They are symptomatic. Plan: 30 day event monitor. Follow-up in A. fib clinic with A. fib clinic in 3-4 weeks. Can determine if it is reasonable to consider cardioversion depending on A. fib burden on monitor.  We'll need to monitor also for bradycardia as she may have tachybradycardia syndrome.

## 2016-01-06 ENCOUNTER — Telehealth (HOSPITAL_COMMUNITY): Payer: Self-pay

## 2016-01-06 ENCOUNTER — Telehealth: Payer: Self-pay | Admitting: *Deleted

## 2016-01-06 NOTE — Telephone Encounter (Signed)
INFORMED DR HARDING ,PATIENT HAD SEVERAL CONCERNS ABOUT TEST ,AND  THAT ROBIN MOFFITT ( SUPERVISOR OF NUCLEAR IMAGING AT [employer]) HAD CONVERSTATION WITH PATIENT,CONCERNING  UPCOMING LEXISICAN MYOVIEW. PER DR HARDING, HE STATES HE SPOKE TO PATIENT AND DISCUSS MYOVIEW.  HE STATED PATIENT DISCUSS ISSUES SHE HAD WITH SIMILAR TEST IN THE PAST.  HE STATED THAT HE WANTED HER TO HAVE LEXISCAN  AND NOT EXERCISE MYOVIEW BECAUSE HE DID NOT WANT TO STOP HER MEDICATION PRIOR , AND PATIENT IS IN ATRIAL FIBRILLATION.

## 2016-01-06 NOTE — Telephone Encounter (Signed)
No calls. Opened in error. Encounter complete.

## 2016-01-07 ENCOUNTER — Ambulatory Visit (HOSPITAL_COMMUNITY)
Admission: RE | Admit: 2016-01-07 | Discharge: 2016-01-07 | Disposition: A | Discharge status: Home / Self Care | Payer: Medicare Other | Source: Ambulatory Visit | Attending: Cardiovascular Disease | Admitting: Cardiovascular Disease

## 2016-01-07 DIAGNOSIS — R002 Palpitations: Secondary | ICD-10-CM | POA: Diagnosis not present

## 2016-01-07 DIAGNOSIS — I739 Peripheral vascular disease, unspecified: Secondary | ICD-10-CM | POA: Diagnosis not present

## 2016-01-07 DIAGNOSIS — I4891 Unspecified atrial fibrillation: Secondary | ICD-10-CM

## 2016-01-07 DIAGNOSIS — Z87891 Personal history of nicotine dependence: Secondary | ICD-10-CM | POA: Diagnosis not present

## 2016-01-07 DIAGNOSIS — Z8249 Family history of ischemic heart disease and other diseases of the circulatory system: Secondary | ICD-10-CM | POA: Diagnosis not present

## 2016-01-07 DIAGNOSIS — R001 Bradycardia, unspecified: Secondary | ICD-10-CM | POA: Diagnosis not present

## 2016-01-07 DIAGNOSIS — R9439 Abnormal result of other cardiovascular function study: Secondary | ICD-10-CM | POA: Diagnosis not present

## 2016-01-07 DIAGNOSIS — R0609 Other forms of dyspnea: Secondary | ICD-10-CM | POA: Diagnosis not present

## 2016-01-07 DIAGNOSIS — E119 Type 2 diabetes mellitus without complications: Secondary | ICD-10-CM | POA: Insufficient documentation

## 2016-01-07 DIAGNOSIS — R5383 Other fatigue: Secondary | ICD-10-CM | POA: Insufficient documentation

## 2016-01-07 DIAGNOSIS — I119 Hypertensive heart disease without heart failure: Secondary | ICD-10-CM | POA: Insufficient documentation

## 2016-01-07 DIAGNOSIS — F419 Anxiety disorder, unspecified: Secondary | ICD-10-CM | POA: Insufficient documentation

## 2016-01-07 DIAGNOSIS — I447 Left bundle-branch block, unspecified: Secondary | ICD-10-CM

## 2016-01-07 DIAGNOSIS — R0602 Shortness of breath: Secondary | ICD-10-CM

## 2016-01-07 LAB — MYOCARDIAL PERFUSION IMAGING
CHL CUP RESTING HR STRESS: 107 {beats}/min
CSEPPHR: 133 {beats}/min
SDS: 2
SRS: 9
SSS: 11
TID: 1.15

## 2016-01-07 MED ORDER — REGADENOSON 0.4 MG/5ML IV SOLN
0.4000 mg | Freq: Once | INTRAVENOUS | Status: AC
  Administered 2016-01-07: 0.4 mg via INTRAVENOUS

## 2016-01-07 MED ORDER — AMINOPHYLLINE 25 MG/ML IV SOLN
75.0000 mg | Freq: Once | INTRAVENOUS | Status: AC
  Administered 2016-01-07: 75 mg via INTRAVENOUS

## 2016-01-07 MED ORDER — TECHNETIUM TC 99M TETROFOSMIN IV KIT
28.5000 | PACK | Freq: Once | INTRAVENOUS | Status: AC | PRN
  Administered 2016-01-07: 28.5 via INTRAVENOUS
  Filled 2016-01-07: qty 29

## 2016-01-07 MED ORDER — TECHNETIUM TC 99M TETROFOSMIN IV KIT
9.6000 | PACK | Freq: Once | INTRAVENOUS | Status: AC | PRN
  Administered 2016-01-07: 9.6 via INTRAVENOUS
  Filled 2016-01-07: qty 10

## 2016-01-10 DIAGNOSIS — J069 Acute upper respiratory infection, unspecified: Secondary | ICD-10-CM | POA: Diagnosis not present

## 2016-01-11 DIAGNOSIS — Z6825 Body mass index (BMI) 25.0-25.9, adult: Secondary | ICD-10-CM | POA: Diagnosis not present

## 2016-01-11 DIAGNOSIS — I4891 Unspecified atrial fibrillation: Secondary | ICD-10-CM | POA: Diagnosis not present

## 2016-01-11 DIAGNOSIS — J209 Acute bronchitis, unspecified: Secondary | ICD-10-CM | POA: Diagnosis not present

## 2016-01-13 ENCOUNTER — Other Ambulatory Visit: Payer: Self-pay

## 2016-01-13 ENCOUNTER — Ambulatory Visit (HOSPITAL_COMMUNITY): Payer: Medicare Other | Attending: Cardiology

## 2016-01-13 DIAGNOSIS — E119 Type 2 diabetes mellitus without complications: Secondary | ICD-10-CM | POA: Diagnosis not present

## 2016-01-13 DIAGNOSIS — I4891 Unspecified atrial fibrillation: Secondary | ICD-10-CM | POA: Diagnosis not present

## 2016-01-13 DIAGNOSIS — R0602 Shortness of breath: Secondary | ICD-10-CM | POA: Diagnosis not present

## 2016-01-13 DIAGNOSIS — Z87891 Personal history of nicotine dependence: Secondary | ICD-10-CM | POA: Diagnosis not present

## 2016-01-13 DIAGNOSIS — I119 Hypertensive heart disease without heart failure: Secondary | ICD-10-CM | POA: Insufficient documentation

## 2016-01-13 DIAGNOSIS — I447 Left bundle-branch block, unspecified: Secondary | ICD-10-CM | POA: Diagnosis not present

## 2016-01-13 DIAGNOSIS — R001 Bradycardia, unspecified: Secondary | ICD-10-CM | POA: Diagnosis not present

## 2016-01-13 DIAGNOSIS — I480 Paroxysmal atrial fibrillation: Secondary | ICD-10-CM | POA: Insufficient documentation

## 2016-01-13 DIAGNOSIS — I340 Nonrheumatic mitral (valve) insufficiency: Secondary | ICD-10-CM | POA: Diagnosis not present

## 2016-01-13 DIAGNOSIS — R002 Palpitations: Secondary | ICD-10-CM

## 2016-01-13 LAB — ECHOCARDIOGRAM COMPLETE
CHL CUP DOP CALC LVOT VTI: 17.3 cm
CHL CUP TV REG PEAK VELOCITY: 252 cm/s
E decel time: 165 msec
FS: 25 % — AB (ref 28–44)
IVS/LV PW RATIO, ED: 0.98
LA diam end sys: 40 mm
LA diam index: 2.31 cm/m2
LA vol A4C: 52 ml
LA vol index: 34.7 mL/m2
LASIZE: 40 mm
LAVOL: 60 mL
LDCA: 2.54 cm2
LV PW d: 11.8 mm — AB (ref 0.6–1.1)
LVOT peak grad rest: 5 mmHg
LVOT peak vel: 109 cm/s
LVOTD: 18 mm
LVOTSV: 44 mL
MV Dec: 165
MV pk E vel: 87 m/s
MVPG: 3 mmHg
TR max vel: 252 cm/s

## 2016-01-19 ENCOUNTER — Other Ambulatory Visit: Payer: Self-pay | Admitting: Cardiology

## 2016-01-20 NOTE — Telephone Encounter (Signed)
Rx Refill

## 2016-01-22 DIAGNOSIS — H353221 Exudative age-related macular degeneration, left eye, with active choroidal neovascularization: Secondary | ICD-10-CM | POA: Diagnosis not present

## 2016-01-22 DIAGNOSIS — H353114 Nonexudative age-related macular degeneration, right eye, advanced atrophic with subfoveal involvement: Secondary | ICD-10-CM | POA: Diagnosis not present

## 2016-01-26 ENCOUNTER — Telehealth: Payer: Self-pay | Admitting: Cardiology

## 2016-01-26 DIAGNOSIS — Z1231 Encounter for screening mammogram for malignant neoplasm of breast: Secondary | ICD-10-CM | POA: Diagnosis not present

## 2016-01-26 DIAGNOSIS — Z853 Personal history of malignant neoplasm of breast: Secondary | ICD-10-CM | POA: Diagnosis not present

## 2016-01-26 NOTE — Telephone Encounter (Signed)
Received in coming call with critical from [employer]. [facility] reported 30 sec run or rapid A. Fib at sustained 160 bpm - autodetected -  No symptoms. [employer] contacted patient who said she was walking around at the time but had no symptoms.  Strips received from [employer]. Will take to DOD.

## 2016-01-26 NOTE — Telephone Encounter (Signed)
Spoke with Dr Stanford Breed, DOD. He reviewed the strips and the fact that patient is asymptomatic. Noted changes were made with medications by Dr Ellyn Hack on 01/05/16. Dr Stanford Breed advised to continue with the plan to have patient seen in the A. Fib clinic on 02/01/16 for evaluation.  Called patient. She said she did not have any symptoms during the recorded strips by [employer]. Gave her Dr Jacalyn Lefevre advice to continue with current plan of care. Patient aware of appt with A. Fib clinic on 02/01/16. Patient had several questions concerning A Fib. Clinic. Informed patient on where clinic is located and code given, that she will see Roderic Palau, NP, and that the strips and final report from [facility] will be reviewed by the doctor at the end of the 30 days.  She verbalized understanding.

## 2016-01-27 ENCOUNTER — Telehealth: Payer: Self-pay | Admitting: Cardiology

## 2016-01-27 NOTE — Telephone Encounter (Signed)
New message       Calling with an abnormal EKG reading

## 2016-01-27 NOTE — Telephone Encounter (Signed)
Presented strips from [employer] along with daily reports up to this point to Dr. Stanford Breed.  Dr Stanford Breed reviewed reports. Patient is in and out of A fib. He advised for patient to be seen in A fib clinic this week as scheduled.  Returned call to patient, notified her of Dr Jacalyn Lefevre recommendation and she verbalized understanding.

## 2016-01-27 NOTE — Telephone Encounter (Signed)
Received incoming call from [employer] concerning patient's monitor. Monitor has recording of greater than 30 minutes of A. Fib with RVR from 150-170 bpm. [employer] to fax strips. [employer] contacted patient and she was asymptomatic and playing dominos with no problem. Will take to DOD for review.

## 2016-01-27 NOTE — Telephone Encounter (Signed)
Correction from documentation at 1447 in encounter: After receiving strips from [employer], monitor has recording of A. Fib with RVR for greater than 30 seconds, not minutes.

## 2016-01-27 NOTE — Telephone Encounter (Signed)
Contacted A. Fib clinic and had appt rescheduled for Friday, January 29, 2016 at 1000.  Called patient to see how she was feeling. Patient denies CP, SOB, palpitations and stated she was playing dominos with her friends. Gave pt appt change and she was agreeable.

## 2016-01-29 ENCOUNTER — Ambulatory Visit (HOSPITAL_COMMUNITY)
Admission: RE | Admit: 2016-01-29 | Discharge: 2016-01-29 | Disposition: A | Discharge status: Home / Self Care | Payer: Medicare Other | Source: Ambulatory Visit | Attending: Nurse Practitioner | Admitting: Nurse Practitioner

## 2016-01-29 ENCOUNTER — Telehealth: Payer: Self-pay | Admitting: Cardiology

## 2016-01-29 ENCOUNTER — Encounter (HOSPITAL_COMMUNITY): Payer: Self-pay | Admitting: Nurse Practitioner

## 2016-01-29 ENCOUNTER — Other Ambulatory Visit: Payer: Self-pay

## 2016-01-29 VITALS — BP 136/82 | HR 86 | Ht 63.0 in | Wt 146.0 lb

## 2016-01-29 DIAGNOSIS — I4891 Unspecified atrial fibrillation: Secondary | ICD-10-CM | POA: Diagnosis not present

## 2016-01-29 DIAGNOSIS — Z87891 Personal history of nicotine dependence: Secondary | ICD-10-CM | POA: Diagnosis not present

## 2016-01-29 DIAGNOSIS — Z79899 Other long term (current) drug therapy: Secondary | ICD-10-CM | POA: Insufficient documentation

## 2016-01-29 DIAGNOSIS — E119 Type 2 diabetes mellitus without complications: Secondary | ICD-10-CM | POA: Insufficient documentation

## 2016-01-29 DIAGNOSIS — Z888 Allergy status to other drugs, medicaments and biological substances status: Secondary | ICD-10-CM | POA: Insufficient documentation

## 2016-01-29 DIAGNOSIS — Z885 Allergy status to narcotic agent status: Secondary | ICD-10-CM | POA: Diagnosis not present

## 2016-01-29 DIAGNOSIS — Z794 Long term (current) use of insulin: Secondary | ICD-10-CM | POA: Diagnosis not present

## 2016-01-29 DIAGNOSIS — E781 Pure hyperglyceridemia: Secondary | ICD-10-CM | POA: Diagnosis not present

## 2016-01-29 DIAGNOSIS — Z8249 Family history of ischemic heart disease and other diseases of the circulatory system: Secondary | ICD-10-CM | POA: Insufficient documentation

## 2016-01-29 DIAGNOSIS — Z7901 Long term (current) use of anticoagulants: Secondary | ICD-10-CM | POA: Diagnosis not present

## 2016-01-29 DIAGNOSIS — H3530 Unspecified macular degeneration: Secondary | ICD-10-CM | POA: Insufficient documentation

## 2016-01-29 DIAGNOSIS — I10 Essential (primary) hypertension: Secondary | ICD-10-CM | POA: Insufficient documentation

## 2016-01-29 DIAGNOSIS — I447 Left bundle-branch block, unspecified: Secondary | ICD-10-CM | POA: Diagnosis not present

## 2016-01-29 MED ORDER — METOPROLOL SUCCINATE ER 25 MG PO TB24: 25.0000 mg | ORAL_TABLET | Freq: Every day | ORAL | Status: DC

## 2016-01-29 NOTE — Telephone Encounter (Signed)
[facility] monitor - AF with RVR - HR 170 @ 1:52pm CT -- auto-trigger -- monitor service has not been able to reach patient yet  Patient saw D. Kayleen Memos, NP in AF clinic today 7/14 - metoprolol succinate was Rx'ed  Will route to MD as Juluis Rainier

## 2016-01-29 NOTE — Patient Instructions (Signed)
Your physician has recommended you make the following change in your medication:  Add metoprolol XL 25 mg once daily  Follow up in 3 weeks

## 2016-01-29 NOTE — Telephone Encounter (Signed)
New message      The tech need to report a abnormal EKG reading

## 2016-01-30 ENCOUNTER — Encounter (HOSPITAL_COMMUNITY): Payer: Self-pay | Admitting: Nurse Practitioner

## 2016-01-30 NOTE — Progress Notes (Signed)
Patient ID: Isabella Wright, female   DOB: [birth year], 70 y.o.   MRN: [id number]     Primary Care Physician: Velna Hatchet, MD Referring Physician: Dr. Jena Gauss Gulden is a 70 y.o. female with a h/o DM, HTN, hypertriglyceridemia, LBBB, episodes of tachycardia for many years, but dx as afib 12/2015. She is currently wearing an event monitor. She has had strips showing afib with rvr up to 150-170 bpm. Longest episode reported in EPIC lastest 30 mins. Pt states she was unaware of having afib when the monitor company called to check on her. She is on xarelto with  a chadsvasc score of at least 4. 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 He had previously been on amiodarone, which made him very ill, and tikosyn which controlled the afib and he tolerated well.  She denies tobacco abuse, alcohol use, minimal caffeine. Denies sleep apnea. Exercises on a regular basis.Has been on Inderal for years, uses more prn than daily. Is also on diltiazem.  Today, she denies symptoms of palpitations, chest pain, shortness of breath, orthopnea, PND, lower extremity edema, dizziness, presyncope, syncope, or neurologic sequela. The patient is tolerating medications without difficulties and is otherwise without complaint today.   Past Medical History  Diagnosis Date  . Hypertriglyceridemia 1996    Very labile and difficult control: Began in 10/29/1994 when she began taking Premarin after hysterectomy -- triglycerides increased to 3582 neuropathy in her feet> care for by endocrinologis; levels vary based on stress.;; Labs from March 2015: TC 150, TG 745, HDL 26, LDL 45  . Hypertension 2000    2009: Renal Dopplers showed R RA ostial stenosis -- (told not a problem or 20 yrs)  . Diabetes mellitus type 2, controlled ([place]) 10/29/10  . Paroxysmal SVT (supraventricular  tachycardia) ([place]) 10-29-07    less frequent following I&D of chest wall abscess (Jan 015)  . LBBB (left bundle branch block) 29-Oct-2007    Initially diagnosed as rate related during stress echo.  . Macular degeneration     Dry  . New onset atrial fibrillation ([place]) 01/04/2016    This patients CHA2DS2-VASc Score and unadjusted Ischemic Stroke Rate (% per year) is equal to 4.8 % stroke rate/year from a score of 4 -- 1 point each: HTN, DM, Age if 74-74, or Female]      Past Surgical History  Procedure Laterality Date  . Transthoracic echocardiogram  2011-10-29    normal  . Transthoracic echocardiogram  March 2015    aortic sclerosis; without stenosis. Normal EF  . Treadmill stress echo  01/2009    No regional wall motion abnormalities with stress == non-ischemic; rate related incomplete left bundle branch block  . Appendectomy  1968  . Cystoscopy/retrograde/ureteroscopy/stone extraction with basket  10-29-70  . Excisional hemorrhoidectomy   1981  . Total abdominal hysterectomy w/ bilateral salpingoophorectomy  10-28-1992  . Cystoscopy w/ ureteroscopy w/ lithotripsy   Oct 28, 1997    Large calcium oxalate stone  . Orif proximal tibial plateau fracture Left 2005/10/28    , Related shattered left tibial plateau with reattachment of torn ligaments and insertion of titanium plate and screws.  . Abdominal doppler ultrasound  February 2009     Right ostial renal artery stenosis; normal renal structure.  . Abcess drainage  January 2015  chest wall; with antibiotics --> following this treatment, SVT episodes became much less frequent.  . Renal artery duplex  01/02/2014    R&L RA - mildly elevated velocities - < 60%; Bilateral Kidneys - normal shape & size.    Current Outpatient Prescriptions  Medication Sig Dispense Refill  . cholecalciferol (VITAMIN D) 1000 UNITS tablet Take 1,000 Units by mouth daily.    Marland Kitchen diltiazem (CARDIZEM CD) 180 MG 24 hr capsule TAKE 1 CAPSULE (180 MG TOTAL) BY MOUTH DAILY. 60 capsule 4  . Dulaglutide  (TRULICITY) A999333 0000000 SOPN Inject into the skin.    . fenofibrate 160 MG tablet Take 160 mg by mouth daily.     Marland Kitchen glimepiride (AMARYL) 4 MG tablet Take 1 tablet by mouth daily.  5  . hydrochlorothiazide (HYDRODIURIL) 25 MG tablet TAKE 1 TABLET (25 MG TOTAL) BY MOUTH DAILY. 90 tablet 2  . Insulin Detemir (LEVEMIR FLEXTOUCH) 100 UNIT/ML Pen Inject into the skin.    . metFORMIN (GLUCOPHAGE-XR) 500 MG 24 hr tablet 1000 MG TAKE IN THE EVENING    . Multiple Vitamins-Minerals (PRESERVISION AREDS 2 PO) Take by mouth 2 (two) times daily.    Marland Kitchen omega-3 acid ethyl esters (LOVAZA) 1 G capsule Take 2 g by mouth 2 (two) times daily.     Marland Kitchen omeprazole (PRILOSEC OTC) 20 MG tablet Take 20 mg by mouth daily.    . propranolol (INDERAL) 20 MG tablet Take 1 tablet (20 mg total) by mouth as needed. (Patient taking differently: Take 20 mg by mouth 2 (two) times daily. ) 90 tablet 1  . ramipril (ALTACE) 10 MG capsule Take 10 mg by mouth daily.     . rivaroxaban (XARELTO) 20 MG TABS tablet Take 1 tablet (20 mg total) by mouth daily with supper. 30 tablet 11  . rosuvastatin (CRESTOR) 10 MG tablet TAKE 1 TABLET (10 MG TOTAL) BY MOUTH DAILY. 30 tablet 6  . metoprolol succinate (TOPROL XL) 25 MG 24 hr tablet Take 1 tablet (25 mg total) by mouth daily. 30 tablet 6   No current facility-administered medications for this encounter.    Allergies  Allergen Reactions  . Fluorescein Anaphylaxis    Pt states she had swelling of facial tissue with SOB and itching with a rapid heart rate.  . Exenatide Nausea And Vomiting  . Liraglutide Nausea And Vomiting  . Demerol [Meperidine] Nausea And Vomiting    Social History   Social History  . Marital Status: Widowed    Spouse Name: N/A  . Number of Children: N/A  . Years of Education: N/A   Occupational History  . Not on file.   Social History Main Topics  . Smoking status: Former Smoker    Quit date: 11/29/1983  . Smokeless tobacco: Not on file     Comment: WAS A  SOCIAL SMOKER  . Alcohol Use: No  . Drug Use: No  . Sexual Activity: Not on file   Other Topics Concern  . 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 She is now widowed for just under 2 years. Her husband died of cancer. They do not have any children.   She recently (spring of 2015) moved to [place] to be closer to her brother, [name] and his family.   She been the caregiver for her ailing husband for 5 years. He passed away in 20-Feb-2012, and she has had prolonged period of grieving  partly due to her having lost 3 members of her immediate family during that time period as well.  --- She is under a lot of stress      She is a former smoker quit years ago. She does not drink alcohol. She is active, but not routine exercise. She says that she tries to eat healthy and knows what usually affects her triglyceride levels.    Family History  Problem Relation Age of Onset  . Heart disease Brother     Died at 24  . Heart disease Mother     Died at 43  . Heart disease Father     Died at 57  . Breast cancer Sister     Died at 64    ROS- All systems are reviewed and negative except as per the HPI above  Physical Exam: Filed Vitals:   01/29/16 1005  BP: 136/82  Pulse: 86  Height: 5\' 3"  (1.6 m)  Weight: 146 lb (66.225 kg)    GEN- The patient is well appearing, alert and oriented x 3 today.   Head- normocephalic, atraumatic Eyes-  Sclera clear, conjunctiva pink Ears- hearing intact Oropharynx- clear Neck- supple, no JVP Lymph- no cervical lymphadenopathy Lungs- Clear to ausculation bilaterally, normal work of breathing Heart- Regular rate and rhythm, no murmurs, rubs or gallops, PMI not laterally displaced GI- soft, NT, ND, + BS Extremities- no clubbing, cyanosis, or edema MS- no significant deformity or atrophy Skin- no rash or lesion Psych- euthymic mood, full affect Neuro- strength and sensation are  intact  EKG-SR with LBBB, LAD, pr int 168 ms, qrs int 136 ms, tc 488 ms Epic records reviewed ECHO- 12/2015-Left ventricle: The cavity size was normal. Wall thickness was  increased in a pattern of mild LVH. Systolic function was low  normal to mildly reduced. The estimated ejection fraction was in  the range of 50% to 55%. Septal-lateral dyssynchrony.  Indeterminant diastolic function (atrial fibrillation). - Aortic valve: There was no stenosis. - Mitral valve: There was trivial regurgitation. - Left atrium: The atrium was mildly dilated. - Right ventricle: The cavity size was normal. Systolic function  was normal. - Tricuspid valve: Peak RV-RA gradient (S): 25 mm Hg. - Pulmonary arteries: PA peak pressure: 28 mm Hg (S). - Inferior vena cava: The vessel was normal in size. The  respirophasic diameter changes were in the normal range (>= 50%),  consistent with normal central venous pressure.  Impressions:  - Normal LV size with mild LV hypertrophy. EF 50-55%,  septal-lateral dyssynchrony. Normal RV size and systolic  function. No significant valvular abnormalities. Labs, Trigs 456, Creat- 0.90, Potassium 4.0  Assessment and Plan: 1.  New onset afib, symptomatic with rapid episodes in past, recently not as symptomatic Continue xarelto Continue diltiazem Add metoprolol ER 25 mg daily and can use Inderal for any symptomatic episodes as prn drug for RVR Continue to wear event monitor and will review on completion for afib burden Pt is interested in  tikosyn if antiarrythmic is suggested. Qtc is 488 ms and will have to discuss with Dr. Rayann Heman , If LBBB, is accounted for , if qtc would be acceptable to consider for tikosyn. She would not be an optimal 1C agent candidate due to LBBB at baseline and she is not interested in amiodarone.  F/u in 3 weeks   Geroge Baseman. [place], [facility] [address] [place], [place] [address] [phone]

## 2016-02-01 ENCOUNTER — Ambulatory Visit (HOSPITAL_COMMUNITY): Payer: Medicare Other | Admitting: Nurse Practitioner

## 2016-02-01 NOTE — Telephone Encounter (Signed)
I am glad she saw Roderic Palau. The scope this improves her heart rate.  Glenetta Hew, MD

## 2016-02-02 DIAGNOSIS — I4891 Unspecified atrial fibrillation: Secondary | ICD-10-CM | POA: Diagnosis not present

## 2016-02-02 DIAGNOSIS — R001 Bradycardia, unspecified: Secondary | ICD-10-CM

## 2016-02-02 DIAGNOSIS — R002 Palpitations: Secondary | ICD-10-CM | POA: Diagnosis not present

## 2016-02-02 DIAGNOSIS — I447 Left bundle-branch block, unspecified: Secondary | ICD-10-CM | POA: Diagnosis not present

## 2016-02-02 DIAGNOSIS — R0602 Shortness of breath: Secondary | ICD-10-CM

## 2016-02-16 DIAGNOSIS — E1165 Type 2 diabetes mellitus with hyperglycemia: Secondary | ICD-10-CM | POA: Diagnosis not present

## 2016-02-16 DIAGNOSIS — I482 Chronic atrial fibrillation: Secondary | ICD-10-CM | POA: Diagnosis not present

## 2016-02-16 DIAGNOSIS — Z794 Long term (current) use of insulin: Secondary | ICD-10-CM | POA: Diagnosis not present

## 2016-02-17 ENCOUNTER — Encounter: Payer: Self-pay | Admitting: Cardiology

## 2016-02-17 DIAGNOSIS — Z794 Long term (current) use of insulin: Secondary | ICD-10-CM | POA: Diagnosis not present

## 2016-02-17 DIAGNOSIS — E1165 Type 2 diabetes mellitus with hyperglycemia: Secondary | ICD-10-CM | POA: Diagnosis not present

## 2016-02-17 DIAGNOSIS — I482 Chronic atrial fibrillation: Secondary | ICD-10-CM | POA: Diagnosis not present

## 2016-02-19 ENCOUNTER — Ambulatory Visit (HOSPITAL_COMMUNITY)
Admission: RE | Admit: 2016-02-19 | Discharge: 2016-02-19 | Disposition: A | Discharge status: Home / Self Care | Payer: Medicare Other | Source: Ambulatory Visit | Attending: Nurse Practitioner | Admitting: Nurse Practitioner

## 2016-02-19 ENCOUNTER — Encounter (HOSPITAL_COMMUNITY): Payer: Self-pay | Admitting: Nurse Practitioner

## 2016-02-19 DIAGNOSIS — H3530 Unspecified macular degeneration: Secondary | ICD-10-CM | POA: Diagnosis not present

## 2016-02-19 DIAGNOSIS — Z79899 Other long term (current) drug therapy: Secondary | ICD-10-CM | POA: Insufficient documentation

## 2016-02-19 DIAGNOSIS — R0602 Shortness of breath: Secondary | ICD-10-CM | POA: Diagnosis not present

## 2016-02-19 DIAGNOSIS — Z7901 Long term (current) use of anticoagulants: Secondary | ICD-10-CM | POA: Insufficient documentation

## 2016-02-19 DIAGNOSIS — Z90710 Acquired absence of both cervix and uterus: Secondary | ICD-10-CM | POA: Diagnosis not present

## 2016-02-19 DIAGNOSIS — I447 Left bundle-branch block, unspecified: Secondary | ICD-10-CM | POA: Insufficient documentation

## 2016-02-19 DIAGNOSIS — Z803 Family history of malignant neoplasm of breast: Secondary | ICD-10-CM | POA: Insufficient documentation

## 2016-02-19 DIAGNOSIS — E119 Type 2 diabetes mellitus without complications: Secondary | ICD-10-CM | POA: Insufficient documentation

## 2016-02-19 DIAGNOSIS — I10 Essential (primary) hypertension: Secondary | ICD-10-CM | POA: Diagnosis not present

## 2016-02-19 DIAGNOSIS — Z9889 Other specified postprocedural states: Secondary | ICD-10-CM | POA: Diagnosis not present

## 2016-02-19 DIAGNOSIS — Z7984 Long term (current) use of oral hypoglycemic drugs: Secondary | ICD-10-CM | POA: Diagnosis not present

## 2016-02-19 DIAGNOSIS — I252 Old myocardial infarction: Secondary | ICD-10-CM | POA: Diagnosis not present

## 2016-02-19 DIAGNOSIS — I480 Paroxysmal atrial fibrillation: Secondary | ICD-10-CM | POA: Diagnosis not present

## 2016-02-19 DIAGNOSIS — I4891 Unspecified atrial fibrillation: Secondary | ICD-10-CM | POA: Diagnosis not present

## 2016-02-19 DIAGNOSIS — Z794 Long term (current) use of insulin: Secondary | ICD-10-CM | POA: Insufficient documentation

## 2016-02-19 DIAGNOSIS — Z8249 Family history of ischemic heart disease and other diseases of the circulatory system: Secondary | ICD-10-CM | POA: Diagnosis not present

## 2016-02-19 DIAGNOSIS — Z87891 Personal history of nicotine dependence: Secondary | ICD-10-CM | POA: Diagnosis not present

## 2016-02-19 DIAGNOSIS — R002 Palpitations: Secondary | ICD-10-CM | POA: Insufficient documentation

## 2016-02-19 MED ORDER — METOPROLOL SUCCINATE ER 25 MG PO TB24: 25.0000 mg | ORAL_TABLET | Freq: Two times a day (BID) | ORAL | 3 refills | Status: DC

## 2016-02-19 NOTE — Progress Notes (Signed)
Patient ID: Isabella Wright, female   DOB: 16-Jul-1946, 70 y.o.   MRN: [id number]     Primary Care Physician: Velna Hatchet, MD Referring Physician: Dr. Jena Gauss Plagman is a 70 y.o. female with a h/o DM, HTN, hypertriglyceridemia, LBBB,previuosly worked up in [place] ,[place], episodes of tachycardia for many years, but dx as afib 12/2015. She is currently wearing an event monitor. She has had strips showing afib with rvr up to 150-170 bpm. Longest episode reported in EPIC lastest 30 mins. Pt states she was unaware of having afib when the monitor company called to check on her. She is on xarelto with  a chadsvasc score of at least 4. 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 He had previously been on amiodarone, which made him very ill, and tikosyn which controlled the afib and he tolerated well, until it was stopped due to long QT.  She denies tobacco abuse, alcohol use, minimal caffeine. Snores some but does not think to a significant degree. Exercises on a regular basis.Has been on Inderal for years, uses more prn than daily. Is also on diltiazem. Metoprolol daily added.  Returns to afib clinic, 8/4. Event monitor finished and showed 55% afib burden. She is in afib today with rvr. Some days she feels well and other days, she feels short of breath with activities. She enjoys golfing but has refrained this summer for not feeling like she has the energy to play. Recent echo showed normal EF with mildly dilated  left atrium. She had a stress test which was low risk.  Today, she denies symptoms of palpitations, chest pain, orthopnea, PND, lower extremity edema, dizziness, presyncope, syncope, or neurologic sequela. Positive for fatigue and shortness of breath at times. The patient is tolerating medications without difficulties and is otherwise  without complaint today.   Past Medical History:  Diagnosis Date  . Diabetes mellitus type 2, controlled ([place]) 10/26/2010  . Hypertension 2000   2009: Renal Dopplers showed R RA ostial stenosis -- (told not a problem or 20 yrs)  . Hypertriglyceridemia 1996   Very labile and difficult control: Began in 10-26-1994 when she began taking Premarin after hysterectomy -- triglycerides increased to 3582 neuropathy in her feet> care for by endocrinologis; levels vary based on stress.;; Labs from March 2015: TC 150, TG 745, HDL 26, LDL 45  . LBBB (left bundle branch block) 10-26-07   Initially diagnosed as rate related during stress echo.  . Macular degeneration    Dry  . New onset atrial fibrillation ([place]) 01/04/2016   This patients CHA2DS2-VASc Score and unadjusted Ischemic Stroke Rate (% per year) is equal to 4.8 % stroke rate/year from a score of 4 -- 1 point each: HTN, DM, Age if 19-74, or Female]     . Paroxysmal SVT (supraventricular tachycardia) ([place]) 2007/10/26   less frequent following I&D of chest wall abscess (Jan 015)   Past Surgical History:  Procedure Laterality Date  . ABCESS DRAINAGE  January 2015    chest wall; with antibiotics --> following this treatment, SVT episodes became much less frequent.  . Abdominal Doppler ultrasound  February 2009    Right ostial renal artery stenosis; normal renal structure.  . APPENDECTOMY  1968  . CYSTOSCOPY W/ URETEROSCOPY W/ LITHOTRIPSY   10/25/97   Large calcium oxalate  stone  . CYSTOSCOPY/RETROGRADE/URETEROSCOPY/STONE EXTRACTION WITH BASKET  1972  . EXCISIONAL HEMORRHOIDECTOMY   1981  . ORIF PROXIMAL TIBIAL PLATEAU FRACTURE Left 2007   , Related shattered left tibial plateau with reattachment of torn ligaments and insertion of titanium plate and screws.  Marland Kitchen RENAL ARTERY DUPLEX  01/02/2014   R&L RA - mildly elevated velocities - < 60%; Bilateral Kidneys - normal shape & size.  Marland Kitchen TOTAL ABDOMINAL HYSTERECTOMY W/ BILATERAL SALPINGOOPHORECTOMY  1994  . TRANSTHORACIC  ECHOCARDIOGRAM  2013   normal  . TRANSTHORACIC ECHOCARDIOGRAM  March 2015   aortic sclerosis; without stenosis. Normal EF  . TREADMILL STRESS ECHO  01/2009   No regional wall motion abnormalities with stress == non-ischemic; rate related incomplete left bundle branch block    Current Outpatient Prescriptions  Medication Sig Dispense Refill  . cholecalciferol (VITAMIN D) 1000 UNITS tablet Take 1,000 Units by mouth daily.    Marland Kitchen diltiazem (CARDIZEM CD) 180 MG 24 hr capsule TAKE 1 CAPSULE (180 MG TOTAL) BY MOUTH DAILY. 60 capsule 4  . Dulaglutide (TRULICITY) A999333 0000000 SOPN Inject into the skin.    . fenofibrate 160 MG tablet Take 160 mg by mouth daily.     Marland Kitchen glimepiride (AMARYL) 4 MG tablet Take 1 tablet by mouth daily.  5  . hydrochlorothiazide (HYDRODIURIL) 25 MG tablet TAKE 1 TABLET (25 MG TOTAL) BY MOUTH DAILY. 90 tablet 2  . Insulin Aspart (NOVOLOG FLEXPEN [place]) Inject 10 Units into the skin.    . Insulin Detemir (LEVEMIR FLEXTOUCH) 100 UNIT/ML Pen Inject into the skin.    . metFORMIN (GLUCOPHAGE-XR) 500 MG 24 hr tablet 1000 MG TAKE IN THE EVENING    . metoprolol succinate (TOPROL XL) 25 MG 24 hr tablet Take 1 tablet (25 mg total) by mouth 2 (two) times daily. 60 tablet 3  . Multiple Vitamins-Minerals (PRESERVISION AREDS 2 PO) Take by mouth 2 (two) times daily.    Marland Kitchen omega-3 acid ethyl esters (LOVAZA) 1 G capsule Take 2 g by mouth 2 (two) times daily.     Marland Kitchen omeprazole (PRILOSEC OTC) 20 MG tablet Take 20 mg by mouth daily.    . propranolol (INDERAL) 20 MG tablet Take 1 tablet (20 mg total) by mouth as needed. (Patient taking differently: Take 20 mg by mouth 2 (two) times daily. ) 90 tablet 1  . ramipril (ALTACE) 10 MG capsule Take 10 mg by mouth daily.     . rivaroxaban (XARELTO) 20 MG TABS tablet Take 1 tablet (20 mg total) by mouth daily with supper. 30 tablet 11  . rosuvastatin (CRESTOR) 10 MG tablet TAKE 1 TABLET (10 MG TOTAL) BY MOUTH DAILY. 30 tablet 6   No current  facility-administered medications for this encounter.     Allergies  Allergen Reactions  . Fluorescein Anaphylaxis    Pt states she had swelling of facial tissue with SOB and itching with a rapid heart rate.  . Exenatide Nausea And Vomiting  . Liraglutide Nausea And Vomiting  . Demerol [Meperidine] Nausea And Vomiting    Social History   Social History  . Marital status: Widowed    Spouse name: N/A  . Number of children: N/A  . Years of education: N/A   Occupational History  . Not on file.   Social History Main Topics  . Smoking status: Former Smoker    Quit date: 11/29/1983  . Smokeless tobacco: Not on file     Comment: WAS A SOCIAL SMOKER  . Alcohol use No  .  Drug use: No  . Sexual activity: Not on file   Other Topics Concern  . 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 She is now widowed for just under 2 years. Her husband died of cancer. They do not have any children.   She recently (spring of 2015) moved to [place] to be closer to her brother, [name] and his family.   She been the caregiver for her ailing husband for 5 years. He passed away in 04-Mar-2012, and she has had prolonged period of grieving partly due to her having lost 3 members of her immediate family during that time period as well.  --- She is under a lot of stress      She is a former smoker quit years ago. She does not drink alcohol. She is active, but not routine exercise. She says that she tries to eat healthy and knows what usually affects her triglyceride levels.    Family History  Problem Relation Age of Onset  . Heart disease Brother     Died at 32  . Heart disease Mother     Died at 76  . Heart disease Father     Died at 57  . Breast cancer Sister     Died at 59    ROS- All systems are reviewed and negative except as per the HPI above  Physical Exam: Vitals:   02/19/16 0955  BP: (!) 144/90  Pulse: (!) 118    Weight: 148 lb 3.2 oz (67.2 kg)  Height: 5\' 3"  (1.6 m)    GEN- The patient is well appearing, alert and oriented x 3 today.   Head- normocephalic, atraumatic Eyes-  Sclera clear, conjunctiva pink Ears- hearing intact Oropharynx- clear Neck- supple, no JVP Lymph- no cervical lymphadenopathy Lungs- Clear to ausculation bilaterally, normal work of breathing Heart- Regular rate and rhythm, no murmurs, rubs or gallops, PMI not laterally displaced GI- soft, NT, ND, + BS Extremities- no clubbing, cyanosis, or edema MS- no significant deformity or atrophy Skin- no rash or lesion Psych- euthymic mood, full affect Neuro- strength and sensation are intact  EKG- afib at 118 bpm, qrs int 132 ms, qtc 484 ms Epic records reviewed Recent bmet at pcp K+ 4.0 and creatinine 0.85, mag not drawn ECHO- 12/2015-Left ventricle: The cavity size was normal. Wall thickness was  increased in a pattern of mild LVH. Systolic function was low  normal to mildly reduced. The estimated ejection fraction was in  the range of 50% to 55%. Septal-lateral dyssynchrony.  Indeterminant diastolic function (atrial fibrillation). - Aortic valve: There was no stenosis. - Mitral valve: There was trivial regurgitation. - Left atrium: The atrium was mildly dilated. - Right ventricle: The cavity size was normal. Systolic function  was normal. - Tricuspid valve: Peak RV-RA gradient (S): 25 mm Hg. - Pulmonary arteries: PA peak pressure: 28 mm Hg (S). - Inferior vena cava: The vessel was normal in size. The  respirophasic diameter changes were in the normal range (>= 50%),  consistent with normal central venous pressure.  Impressions:  - Normal LV size with mild LV hypertrophy. EF 50-55%,  septal-lateral dyssynchrony. Normal RV size and systolic  function. No significant valvular abnormalities. Labs, Trigs 456, Creat- 0.90, Potassium 4.0   Stress myoview-This is a low risk study.  Findings consistent with  prior myocardial infarction.   Small mid-ventricular septal and anterior wall defect  No  ischemia. May be related to LBBB Nol EF calculated due to rapid afib  30 day event monitor showed 55% afib burden  Assessment and Plan: 1.  New onset afib, symptomatic with feelings less of palpitations, but more so with shortness of breath and fatigue Continue xarelto, reminded not to miss doses Continue diltiazem Increase metoprolol ER 25 mg BID and can use Inderal for any symptomatic episodes as prn drug for RVR  Pt is interested in  [name] . Qtc is 488 ms and reviewed with Dr. Rayann Heman and he thought acceptable for tikosyn with LBBB probably making it appear longer  Offered to set up schedule for tikosyn hospitalization but she has two short vacations coming up and would like to see Dr. Rayann Heman in the office and meet him before she commits to [name]. Appointment scheduled 8/28 at 11:15 am.   She is on HCTZ and this will have to be stopped prior to tikosyn adminstration   F/u afib clinic as needed   Butch Penny C. Broox Lonigro, [facility] [address] [place], [place] [address] [phone]

## 2016-02-19 NOTE — Patient Instructions (Signed)
Your physician has recommended you make the following change in your medication:  1)Increase Metoprolol to 25mg  twice a day  Scheduler will be in touch with you to schedule appointment to meet dr. Rayann Heman

## 2016-03-01 ENCOUNTER — Encounter: Payer: Self-pay | Admitting: Internal Medicine

## 2016-03-09 DIAGNOSIS — H353221 Exudative age-related macular degeneration, left eye, with active choroidal neovascularization: Secondary | ICD-10-CM | POA: Diagnosis not present

## 2016-03-09 DIAGNOSIS — H353114 Nonexudative age-related macular degeneration, right eye, advanced atrophic with subfoveal involvement: Secondary | ICD-10-CM | POA: Diagnosis not present

## 2016-03-14 ENCOUNTER — Encounter: Payer: Self-pay | Admitting: Internal Medicine

## 2016-03-14 ENCOUNTER — Telehealth: Payer: Self-pay | Admitting: Pharmacist

## 2016-03-14 ENCOUNTER — Encounter (INDEPENDENT_AMBULATORY_CARE_PROVIDER_SITE_OTHER): Payer: Self-pay

## 2016-03-14 ENCOUNTER — Ambulatory Visit (INDEPENDENT_AMBULATORY_CARE_PROVIDER_SITE_OTHER): Payer: Medicare Other | Admitting: Internal Medicine

## 2016-03-14 VITALS — BP 138/82 | HR 119 | Ht 63.0 in | Wt 148.8 lb

## 2016-03-14 DIAGNOSIS — I4891 Unspecified atrial fibrillation: Secondary | ICD-10-CM

## 2016-03-14 DIAGNOSIS — I10 Essential (primary) hypertension: Secondary | ICD-10-CM | POA: Diagnosis not present

## 2016-03-14 LAB — BASIC METABOLIC PANEL
Anion gap: 7 (ref 5–15)
BUN: 15 mg/dL (ref 6–20)
CHLORIDE: 104 mmol/L (ref 101–111)
CO2: 28 mmol/L (ref 22–32)
Calcium: 9.4 mg/dL (ref 8.9–10.3)
Creatinine, Ser: 0.85 mg/dL (ref 0.44–1.00)
GFR calc Af Amer: >60 mL/min (ref >60)
GFR calc non Af Amer: >60 mL/min (ref >60)
GLUCOSE: 173 mg/dL — AB (ref 65–99)
POTASSIUM: 3.6 mmol/L (ref 3.5–5.1)
Sodium: 139 mmol/L (ref 135–145)

## 2016-03-14 LAB — MAGNESIUM: MAGNESIUM: 1.9 mg/dL (ref 1.7–2.4)

## 2016-03-14 MED ORDER — POTASSIUM CHLORIDE CRYS ER 20 MEQ PO TBCR: 20.0000 meq | EXTENDED_RELEASE_TABLET | Freq: Every day | ORAL | 0 refills | Status: DC

## 2016-03-14 MED ORDER — METOPROLOL TARTRATE 25 MG PO TABS: ORAL_TABLET | ORAL | 3 refills | Status: DC

## 2016-03-14 MED ORDER — DILTIAZEM HCL ER COATED BEADS 360 MG PO CP24: 360.0000 mg | ORAL_CAPSULE | Freq: Every day | ORAL | 3 refills | Status: DC

## 2016-03-14 NOTE — Progress Notes (Signed)
Spoke with patient to complete Tikosyn education.  Discussed role of Tikosyn in treating atrial fibrillation.  Educated patient on potential side effects including QTc prolongation.  She is aware of the importance of compliance and will call the office if she misses more than  2 doses in a row.  Reviewed medication list.  She is taking HCTZ but this will be discontinued today.  No other contraindicated or QTc prolongating medications noted.  Educated pt on potential drug interactions and to call us with any questions on which mediations are safe to take.  She is appropriately anticoagulated with Xarelto 20mg  daily.  She states she has not missed a dose in the past month but does not consistently take with a meal.  Educated on importance of taking Xarelto with a meal to improve absorption.  Will check BMET and Mg today.  Will supplement electrolytes if needed and plan to admit for Tikosyn tomorrow.

## 2016-03-14 NOTE — Telephone Encounter (Signed)
Spoke with pt.  Her K was slightly low for Tikosyn start.  Will supplement with 40mEq tonight and 20mEq in the AM.  Will check her labs once she is at the hospital.  Spoke with admission.  Bed request placed for tomorrow.

## 2016-03-14 NOTE — Patient Instructions (Addendum)
Medication Instructions:   Your physician has recommended you make the following change in your medication: 1) Stop HCTZ 2) Stop Metoprolol 3) Increase Cardizem to 360 mg daily 4) Take Lopressor 25 mg every 6 hours as needed for fast heartreates   Labwork:  Your physician recommends that you return for lab work today: BMP/MAG       Testing/Procedures:  Tikosyn load ---- Gay Filler will call you later today  Follow-Up: Your physician recommends that you schedule a follow-up appointment in: 1 week from Tikosyn load on 03/25/16 in Afib clinic   Any Other Special Instructions Will Be Listed Below (If Applicable).     If you need a refill on your cardiac medications before your next appointment, please call your pharmacy.

## 2016-03-14 NOTE — Progress Notes (Signed)
Electrophysiology Office Note   Date:  03/14/2016   ID:  Isabella Wright, DOB 1946/05/06, MRN [id number]  PCP:  Velna Hatchet, MD  Cardiologist:  Dr Ellyn Hack Primary Electrophysiologist: Thompson Grayer, MD    CC: AFib   History of Present Illness: Isabella Wright is a 70 y.o. female who presents today for electrophysiology evaluation.   She reports having episodic tachyaplpitations for years which were attributed to SVT.  At times her palpitations were irregular.  Recently, she has developed increasing frequency and duration of irregular palpitations.  She presented to Dr Ellyn Hack 6/17 and was found to have afib.  In retrospect, she thinks that she has had afib for about 6 months prior to this.  She was started on xarelto and referred to the AF clinic. She was placed on metoprolol succinate.  She states that she does not do well with sustained release medicines and has stopped taking her metoprolol.  She finds that she is quite SOB with her afib.  She reports fatigue and decreased exercise tolerance.    Today, she denies symptoms of palpitations, chest pain, orthopnea, PND, lower extremity edema, claudication, dizziness, presyncope, syncope, bleeding, or neurologic sequela. The patient is tolerating medications without difficulties and is otherwise without complaint today.    Past Medical History:  Diagnosis Date  . Diabetes mellitus type 2, controlled ([place]) 2012  . Hypertension 2000   2009: Renal Dopplers showed R RA ostial stenosis -- (told not a problem or 20 yrs)  . Hypertriglyceridemia 1996   Very labile and difficult control: Began in '96 when she began taking Premarin after hysterectomy -- triglycerides increased to 3582 neuropathy in her feet> care for by endocrinologis; levels vary based on stress.;; Labs from March 2015: TC 150, TG 745, HDL 26, LDL 45  . LBBB (left bundle branch block) 2009   Initially diagnosed as rate related during stress echo.  . Macular degeneration    Dry  . New  onset atrial fibrillation ([place]) 01/04/2016   This patients CHA2DS2-VASc Score and unadjusted Ischemic Stroke Rate (% per year) is equal to 4.8 % stroke rate/year from a score of 4 -- 1 point each: HTN, DM, Age if 38-74, or Female]     . Paroxysmal SVT (supraventricular tachycardia) ([place]) 2009   less frequent following I&D of chest wall abscess (Jan 015), not previously documented, may have been afib.   Past Surgical History:  Procedure Laterality Date  . ABCESS DRAINAGE  January 2015    chest wall; with antibiotics --> following this treatment, SVT episodes became much less frequent.  . Abdominal Doppler ultrasound  February 2009    Right ostial renal artery stenosis; normal renal structure.  . APPENDECTOMY  1968  . CYSTOSCOPY W/ URETEROSCOPY W/ LITHOTRIPSY   1999   Large calcium oxalate stone  . CYSTOSCOPY/RETROGRADE/URETEROSCOPY/STONE EXTRACTION WITH BASKET  1972  . EXCISIONAL HEMORRHOIDECTOMY   1981  . ORIF PROXIMAL TIBIAL PLATEAU FRACTURE Left 2007   , Related shattered left tibial plateau with reattachment of torn ligaments and insertion of titanium plate and screws.  Marland Kitchen RENAL ARTERY DUPLEX  01/02/2014   R&L RA - mildly elevated velocities - < 60%; Bilateral Kidneys - normal shape & size.  Marland Kitchen TOTAL ABDOMINAL HYSTERECTOMY W/ BILATERAL SALPINGOOPHORECTOMY  1994  . TRANSTHORACIC ECHOCARDIOGRAM  2013   normal  . TRANSTHORACIC ECHOCARDIOGRAM  March 2015   aortic sclerosis; without stenosis. Normal EF  . TREADMILL STRESS ECHO  01/2009   No regional wall motion abnormalities with stress ==  non-ischemic; rate related incomplete left bundle branch block     Current Outpatient Prescriptions  Medication Sig Dispense Refill  . cholecalciferol (VITAMIN D) 1000 UNITS tablet Take 1,000 Units by mouth daily.    Marland Kitchen diltiazem (CARDIZEM CD) 180 MG 24 hr capsule TAKE 1 CAPSULE (180 MG TOTAL) BY MOUTH DAILY. 60 capsule 4  . Dulaglutide (TRULICITY) A999333 0000000 SOPN Inject into the skin.    .  fenofibrate 160 MG tablet Take 160 mg by mouth daily.     Marland Kitchen glimepiride (AMARYL) 4 MG tablet Take 1 tablet by mouth daily.  5  . hydrochlorothiazide (HYDRODIURIL) 25 MG tablet TAKE 1 TABLET (25 MG TOTAL) BY MOUTH DAILY. 90 tablet 2  . Insulin Aspart (NOVOLOG FLEXPEN [place]) Inject 10 Units into the skin.    . Insulin Detemir (LEVEMIR FLEXTOUCH) 100 UNIT/ML Pen Inject into the skin.    . metFORMIN (GLUCOPHAGE-XR) 500 MG 24 hr tablet 1000 MG TAKE IN THE EVENING    . metoprolol succinate (TOPROL XL) 25 MG 24 hr tablet Take 1 tablet (25 mg total) by mouth 2 (two) times daily. 60 tablet 3  . Multiple Vitamins-Minerals (PRESERVISION AREDS 2 PO) Take by mouth 2 (two) times daily.    Marland Kitchen omega-3 acid ethyl esters (LOVAZA) 1 G capsule Take 2 g by mouth 2 (two) times daily.     Marland Kitchen omeprazole (PRILOSEC OTC) 20 MG tablet Take 20 mg by mouth daily.    . ramipril (ALTACE) 10 MG capsule Take 10 mg by mouth daily.     . rivaroxaban (XARELTO) 20 MG TABS tablet Take 1 tablet (20 mg total) by mouth daily with supper. 30 tablet 11  . rosuvastatin (CRESTOR) 10 MG tablet TAKE 1 TABLET (10 MG TOTAL) BY MOUTH DAILY. 30 tablet 6   No current facility-administered medications for this visit.     Allergies:   Fluorescein; Exenatide; Liraglutide; and Demerol [meperidine]   Social History:  The patient  reports that she quit smoking about 32 years ago. She has never used smokeless tobacco. She reports that she does not drink alcohol or use drugs.   Family History:  The patient's  family history includes Breast cancer in her sister; Heart disease in her brother, father, and mother.    ROS:  Please see the history of present illness.   All other systems are reviewed and negative.    PHYSICAL EXAM: VS:  BP 138/82   Pulse (!) 119   Ht 5\' 3"  (1.6 m)   Wt 148 lb 12.8 oz (67.5 kg)   BMI 26.36 kg/m  , BMI Body mass index is 26.36 kg/m. GEN: Well nourished, well developed, in no acute distress  HEENT: normal  Neck: no JVD,  carotid bruits, or masses Cardiac: tachycardic irregular rhythm; no murmurs, rubs, or gallops,no edema  Respiratory:  clear to auscultation bilaterally, normal work of breathing GI: soft, nontender, nondistended, + BS MS: no deformity or atrophy  Skin: warm and dry  Neuro:  Strength and sensation are intact Psych: euthymic mood, full affect  EKG:  EKG is ordered today. The ekg ordered today shows afib, V rates 119 bpm, LBBB   Lipid Panel     Component Value Date/Time   CHOL 149 12/30/2013 0807   TRIG 456 (H) 12/30/2013 0807   HDL 31 (L) 12/30/2013 0807   LDLCALC NOT CALC 12/30/2013 0807     Wt Readings from Last 3 Encounters:  03/14/16 148 lb 12.8 oz (67.5 kg)  02/19/16 148 lb  3.2 oz (67.2 kg)  01/29/16 146 lb (66.2 kg)      Other studies Reviewed: Additional studies/ records that were reviewed today include: AF clinic records, recent Echo  Review of the above records today demonstrates: preserved EF, mild LA enlargement   ASSESSMENT AND PLAN:  1.  Persistent afib The patient has symptomatic atrial arrhythmias.  V rates are quite high.  chads2vasc score is at least 4.  She is chronically on xarelto. Will increase diltiazem CD to 360mg  daily.  Stop longacting metoprolol per patient preference and use prn lopressor. Therapeutic strategies for afib including medicine and ablation were discussed in detail with the patient today.  I would advise tikosyn.  Will admit for tikosyn load at the next available time.  2. HTN Stable No change required today  Current medicines are reviewed at length with the patient today.   The patient does not have concerns regarding her medicines.  The following changes were made today:  none   Signed, Thompson Grayer, MD  03/14/2016 12:03 PM     [place] [address] [phone] (office) [phone] (fax)

## 2016-03-15 ENCOUNTER — Inpatient Hospital Stay (HOSPITAL_COMMUNITY)
Admission: RE | Admit: 2016-03-15 | Discharge: 2016-03-17 | DRG: 310 | Disposition: A | Discharge status: Home / Self Care | Payer: Medicare Other | Source: Ambulatory Visit | Attending: Internal Medicine | Admitting: Internal Medicine

## 2016-03-15 ENCOUNTER — Encounter (HOSPITAL_COMMUNITY): Payer: Self-pay | Admitting: *Deleted

## 2016-03-15 DIAGNOSIS — H353190 Nonexudative age-related macular degeneration, unspecified eye, stage unspecified: Secondary | ICD-10-CM | POA: Diagnosis present

## 2016-03-15 DIAGNOSIS — I481 Persistent atrial fibrillation: Principal | ICD-10-CM | POA: Diagnosis present

## 2016-03-15 DIAGNOSIS — E785 Hyperlipidemia, unspecified: Secondary | ICD-10-CM | POA: Diagnosis present

## 2016-03-15 DIAGNOSIS — Z794 Long term (current) use of insulin: Secondary | ICD-10-CM

## 2016-03-15 DIAGNOSIS — Z87891 Personal history of nicotine dependence: Secondary | ICD-10-CM

## 2016-03-15 DIAGNOSIS — I10 Essential (primary) hypertension: Secondary | ICD-10-CM | POA: Diagnosis present

## 2016-03-15 DIAGNOSIS — I447 Left bundle-branch block, unspecified: Secondary | ICD-10-CM | POA: Diagnosis present

## 2016-03-15 DIAGNOSIS — I4819 Other persistent atrial fibrillation: Secondary | ICD-10-CM | POA: Diagnosis present

## 2016-03-15 DIAGNOSIS — E119 Type 2 diabetes mellitus without complications: Secondary | ICD-10-CM | POA: Diagnosis present

## 2016-03-15 DIAGNOSIS — E781 Pure hyperglyceridemia: Secondary | ICD-10-CM | POA: Diagnosis present

## 2016-03-15 DIAGNOSIS — Z7901 Long term (current) use of anticoagulants: Secondary | ICD-10-CM

## 2016-03-15 HISTORY — DX: Calculus of kidney: N20.0

## 2016-03-15 HISTORY — DX: Gastro-esophageal reflux disease without esophagitis: K21.9

## 2016-03-15 HISTORY — DX: Other specified malignant neoplasm of skin, unspecified: C44.99

## 2016-03-15 HISTORY — DX: Malignant neoplasm of connective and soft tissue, unspecified: C49.9

## 2016-03-15 HISTORY — DX: Personal history of other diseases of the digestive system: Z87.19

## 2016-03-15 LAB — BASIC METABOLIC PANEL
ANION GAP: 8 (ref 5–15)
BUN: 13 mg/dL (ref 6–20)
CHLORIDE: 106 mmol/L (ref 101–111)
CO2: 26 mmol/L (ref 22–32)
Calcium: 9.9 mg/dL (ref 8.9–10.3)
Creatinine, Ser: 0.91 mg/dL (ref 0.44–1.00)
GFR calc non Af Amer: >60 mL/min (ref >60)
GLUCOSE: 144 mg/dL — AB (ref 65–99)
Potassium: 4 mmol/L (ref 3.5–5.1)
Sodium: 140 mmol/L (ref 135–145)

## 2016-03-15 LAB — GLUCOSE, CAPILLARY
Glucose-Capillary: 106 mg/dL — ABNORMAL HIGH (ref 65–99)
Glucose-Capillary: 258 mg/dL — ABNORMAL HIGH (ref 65–99)
Glucose-Capillary: 192 mg/dL — ABNORMAL HIGH (ref 65–99)

## 2016-03-15 MED ORDER — VITAMIN D 1000 UNITS PO TABS
1000.0000 [IU] | ORAL_TABLET | Freq: Every day | ORAL | Status: DC
  Administered 2016-03-16 – 2016-03-17 (×2): 1000 [IU] via ORAL
  Filled 2016-03-15 (×2): qty 1

## 2016-03-15 MED ORDER — INSULIN DETEMIR 100 UNIT/ML ~~LOC~~ SOLN
50.0000 [IU] | Freq: Every day | SUBCUTANEOUS | Status: DC
  Administered 2016-03-15 – 2016-03-16 (×2): 50 [IU] via SUBCUTANEOUS
  Filled 2016-03-15 (×3): qty 0.5

## 2016-03-15 MED ORDER — DILTIAZEM HCL ER COATED BEADS 180 MG PO CP24
360.0000 mg | ORAL_CAPSULE | Freq: Every day | ORAL | Status: DC
  Administered 2016-03-16 – 2016-03-17 (×2): 360 mg via ORAL
  Filled 2016-03-15 (×2): qty 2

## 2016-03-15 MED ORDER — INSULIN ASPART 100 UNIT/ML ~~LOC~~ SOLN
0.0000 [IU] | Freq: Three times a day (TID) | SUBCUTANEOUS | Status: DC
  Administered 2016-03-16 (×2): 3 [IU] via SUBCUTANEOUS

## 2016-03-15 MED ORDER — GLIMEPIRIDE 4 MG PO TABS
4.0000 mg | ORAL_TABLET | Freq: Every day | ORAL | Status: DC
  Administered 2016-03-16: 4 mg via ORAL
  Filled 2016-03-15 (×3): qty 1

## 2016-03-15 MED ORDER — METOPROLOL TARTRATE 25 MG PO TABS
25.0000 mg | ORAL_TABLET | Freq: Two times a day (BID) | ORAL | Status: DC
  Administered 2016-03-15 – 2016-03-17 (×5): 25 mg via ORAL
  Filled 2016-03-15 (×5): qty 1

## 2016-03-15 MED ORDER — METFORMIN HCL ER 500 MG PO TB24
500.0000 mg | ORAL_TABLET | Freq: Every evening | ORAL | Status: DC
  Administered 2016-03-15 (×2): 500 mg via ORAL
  Filled 2016-03-15 (×2): qty 1

## 2016-03-15 MED ORDER — FENOFIBRATE 160 MG PO TABS
160.0000 mg | ORAL_TABLET | Freq: Every day | ORAL | Status: DC
  Administered 2016-03-16 – 2016-03-17 (×2): 160 mg via ORAL
  Filled 2016-03-15 (×2): qty 1

## 2016-03-15 MED ORDER — POTASSIUM CHLORIDE CRYS ER 20 MEQ PO TBCR
20.0000 meq | EXTENDED_RELEASE_TABLET | Freq: Every day | ORAL | Status: DC
Start: 2016-03-15 — End: 2016-03-17
  Administered 2016-03-15 – 2016-03-17 (×3): 20 meq via ORAL
  Filled 2016-03-15 (×3): qty 1

## 2016-03-15 MED ORDER — SODIUM CHLORIDE 0.9 % IV SOLN
250.0000 mL | INTRAVENOUS | Status: DC | PRN
Start: 2016-03-15 — End: 2016-03-17

## 2016-03-15 MED ORDER — RAMIPRIL 10 MG PO CAPS
10.0000 mg | ORAL_CAPSULE | Freq: Every day | ORAL | Status: DC
  Administered 2016-03-16 – 2016-03-17 (×2): 10 mg via ORAL
  Filled 2016-03-15 (×2): qty 1

## 2016-03-15 MED ORDER — DOFETILIDE 250 MCG PO CAPS
500.0000 ug | ORAL_CAPSULE | Freq: Two times a day (BID) | ORAL | Status: DC
  Administered 2016-03-15 – 2016-03-16 (×3): 500 ug via ORAL
  Filled 2016-03-15 (×3): qty 2

## 2016-03-15 MED ORDER — METFORMIN HCL 500 MG PO TABS
1000.0000 mg | ORAL_TABLET | ORAL | Status: DC
  Administered 2016-03-15 – 2016-03-16 (×2): 1000 mg via ORAL
  Filled 2016-03-15: qty 2

## 2016-03-15 MED ORDER — RIVAROXABAN 20 MG PO TABS
20.0000 mg | ORAL_TABLET | Freq: Every day | ORAL | Status: DC
  Administered 2016-03-15 – 2016-03-16 (×2): 20 mg via ORAL
  Filled 2016-03-15 (×2): qty 1

## 2016-03-15 MED ORDER — PANTOPRAZOLE SODIUM 40 MG PO TBEC
40.0000 mg | DELAYED_RELEASE_TABLET | Freq: Every day | ORAL | Status: DC
  Administered 2016-03-16 – 2016-03-17 (×2): 40 mg via ORAL
  Filled 2016-03-15 (×2): qty 1

## 2016-03-15 MED ORDER — OMEPRAZOLE MAGNESIUM 20 MG PO TBEC: 20.0000 mg | DELAYED_RELEASE_TABLET | Freq: Every day | ORAL | Status: DC

## 2016-03-15 MED ORDER — SODIUM CHLORIDE 0.9% FLUSH: 3.0000 mL | INTRAVENOUS | Status: DC | PRN

## 2016-03-15 MED ORDER — SODIUM CHLORIDE 0.9% FLUSH
3.0000 mL | Freq: Two times a day (BID) | INTRAVENOUS | Status: DC
  Administered 2016-03-15 – 2016-03-17 (×5): 3 mL via INTRAVENOUS

## 2016-03-15 NOTE — H&P (Signed)
ELECTROPHYSIOLOGY HISTORY AND PHYSICAL    Patient ID: Isabella Wright MRN: [id number], DOB/AGE: 69-Mar-1947 69 y.o.  Admit date: 03/15/2016 Date of Consult: 03/15/2016  Primary Physician: Velna Hatchet, MD Primary Cardiologist: Ellyn Hack Electrophysiologist: Olayinka Gathers  CC: here for Tikosyn load   HPI:  Isabella Wright is a 70 y.o. female with a past medical history significant for diabetes, hypertension, hyperlipidemia, LBBB, and persistent atrial fibrillation.  She is symptomatic with her atrial fibrillation with fatigue and shortness of breath. She was seen by Dr Rayann Heman in the office and treatment options were discussed. She elected to pursue rhythm control with Tikosyn and presents today for initiation.    She currently denies chest pain, shortness of breath at rest, LE edema, recent fevers, chills, nausea or vomiting.   Past Medical History:  Diagnosis Date  . Diabetes mellitus type 2, controlled ([place]) 2012  . Hypertension 2000   2009: Renal Dopplers showed R RA ostial stenosis -- (told not a problem or 20 yrs)  . Hypertriglyceridemia 1996   Very labile and difficult control: Began in '96 when she began taking Premarin after hysterectomy -- triglycerides increased to 3582 neuropathy in her feet> care for by endocrinologis; levels vary based on stress.;; Labs from March 2015: TC 150, TG 745, HDL 26, LDL 45  . LBBB (left bundle branch block) 2009   Initially diagnosed as rate related during stress echo.  . Macular degeneration    Dry  . New onset atrial fibrillation ([place]) 01/04/2016   This patients CHA2DS2-VASc Score and unadjusted Ischemic Stroke Rate (% per year) is equal to 4.8 % stroke rate/year from a score of 4 -- 1 point each: HTN, DM, Age if 16-74, or Female]     . Paroxysmal SVT (supraventricular tachycardia) ([place]) 2009   less frequent following I&D of chest wall abscess (Jan 015), not previously documented, may have been afib.     Surgical History:  Past Surgical History:  Procedure  Laterality Date  . ABCESS DRAINAGE  January 2015    chest wall; with antibiotics --> following this treatment, SVT episodes became much less frequent.  . Abdominal Doppler ultrasound  February 2009    Right ostial renal artery stenosis; normal renal structure.  . APPENDECTOMY  1968  . CYSTOSCOPY W/ URETEROSCOPY W/ LITHOTRIPSY   1999   Large calcium oxalate stone  . CYSTOSCOPY/RETROGRADE/URETEROSCOPY/STONE EXTRACTION WITH BASKET  1972  . EXCISIONAL HEMORRHOIDECTOMY   1981  . ORIF PROXIMAL TIBIAL PLATEAU FRACTURE Left 2007   , Related shattered left tibial plateau with reattachment of torn ligaments and insertion of titanium plate and screws.  Marland Kitchen RENAL ARTERY DUPLEX  01/02/2014   R&L RA - mildly elevated velocities - < 60%; Bilateral Kidneys - normal shape & size.  Marland Kitchen TOTAL ABDOMINAL HYSTERECTOMY W/ BILATERAL SALPINGOOPHORECTOMY  1994  . TRANSTHORACIC ECHOCARDIOGRAM  2013   normal  . TRANSTHORACIC ECHOCARDIOGRAM  March 2015   aortic sclerosis; without stenosis. Normal EF  . TREADMILL STRESS ECHO  01/2009   No regional wall motion abnormalities with stress == non-ischemic; rate related incomplete left bundle branch block     Prescriptions Prior to Admission  Medication Sig Dispense Refill Last Dose  . cholecalciferol (VITAMIN D) 1000 UNITS tablet Take 1,000 Units by mouth daily.   Taking  . diltiazem (CARDIZEM CD) 360 MG 24 hr capsule Take 1 capsule (360 mg total) by mouth daily. 90 capsule 3   . Dulaglutide (TRULICITY) A999333 0000000 SOPN Inject into the skin.   Taking  .  fenofibrate 160 MG tablet Take 160 mg by mouth daily.    Taking  . glimepiride (AMARYL) 4 MG tablet Take 1 tablet by mouth daily.  5 Taking  . Insulin Aspart (NOVOLOG FLEXPEN [place]) Inject 10 Units into the skin.   Taking  . Insulin Detemir (LEVEMIR FLEXTOUCH) 100 UNIT/ML Pen Inject into the skin.   Taking  . metFORMIN (GLUCOPHAGE-XR) 500 MG 24 hr tablet 1000 MG TAKE IN THE EVENING   Taking  . metoprolol tartrate (LOPRESSOR)  25 MG tablet Take 1 tablet by mouth every 6 hours as needed for fast heart rates 180 tablet 3   . Multiple Vitamins-Minerals (PRESERVISION AREDS 2 PO) Take by mouth 2 (two) times daily.   Taking  . omega-3 acid ethyl esters (LOVAZA) 1 G capsule Take 2 g by mouth 2 (two) times daily.    Taking  . omeprazole (PRILOSEC OTC) 20 MG tablet Take 20 mg by mouth daily.   Taking  . potassium chloride SA (K-DUR,KLOR-CON) 20 MEQ tablet Take 1 tablet (20 mEq total) by mouth daily. 30 tablet 0   . ramipril (ALTACE) 10 MG capsule Take 10 mg by mouth daily.    Taking  . rivaroxaban (XARELTO) 20 MG TABS tablet Take 1 tablet (20 mg total) by mouth daily with supper. 30 tablet 11 Taking  . rosuvastatin (CRESTOR) 10 MG tablet TAKE 1 TABLET (10 MG TOTAL) BY MOUTH DAILY. 30 tablet 6 Taking    Inpatient Medications:   Allergies:  Allergies  Allergen Reactions  . Fluorescein Anaphylaxis    Pt states she had swelling of facial tissue with SOB and itching with a rapid heart rate.  . Exenatide Nausea And Vomiting  . Liraglutide Nausea And Vomiting  . Demerol [Meperidine] Nausea And Vomiting    Social History   Social History  . Marital status: Widowed    Spouse name: N/A  . Number of children: N/A  . Years of education: N/A   Occupational History  . Not on file.   Social History Main Topics  . Smoking status: Former Smoker    Quit date: 11/29/1983  . Smokeless tobacco: Never Used     Comment: WAS A SOCIAL SMOKER  . Alcohol use No  . Drug use: No  . Sexual activity: Not on file   Other Topics Concern  . 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 She is now widowed for just under 2 years. Her husband died of cancer. They do not have any children.   She recently (spring of 2015) moved to [place] to be closer to her brother, [name] and his family.   She been the caregiver for her ailing husband for 5 years. He passed away in 2012-02-26, and she has had prolonged period of grieving partly due to her having lost 3 members of her immediate family during that time period as well.  --- She is under a lot of stress      She is a former smoker quit years ago. She does not drink alcohol. She is active, but not routine exercise. She says that she tries to eat healthy and knows what usually affects her triglyceride levels.     Family History  Problem Relation Age of Onset  . Heart disease Brother     Died at 79  . Heart disease Mother     Died at 24  . Heart disease Father  Died at 52  . Breast cancer Sister     Died at 85     Review of Systems: All other systems reviewed and are otherwise negative except as noted above.  Physical Exam: Vitals:   03/15/16 1004  BP: (!) 154/79  Pulse: 98  Resp: 20  TempSrc: Oral  SpO2: 99%  Weight: 148 lb 2.4 oz (67.2 kg)  Height: 5\' 3"  (1.6 m)    GEN- The patient is well appearing, alert and oriented x 3 today.   HEENT: normocephalic, atraumatic; sclera clear, conjunctiva pink; hearing intact; oropharynx clear; neck supple  Lungs- Clear to ausculation bilaterally, normal work of breathing.  No wheezes, rales, rhonchi Heart- Tachycardic irregular rate and rhythm  GI- soft, non-tender, non-distended, bowel sounds present  Extremities- no clubbing, cyanosis, or edema  MS- no significant deformity or atrophy Skin- warm and dry, no rash or lesion Psych- euthymic mood, full affect Neuro- strength and sensation are intact  Labs:  No results found for: WBC, HGB, HCT, MCV, PLT  Recent Labs Lab 03/14/16 1307  NA 139  K 3.6  CL 104  CO2 28  BUN 15  CREATININE 0.85  CALCIUM 9.4  GLUCOSE 173*      Radiology/Studies: No results found.  [id number] fibrillation, rate 119, LBBB  TELEMETRY: AF with RVR  Assessment/Plan: 1.  Persistent atrial fibrillation The patient has symptomatic persistent atrial fibrillation and is admitted for Tikosyn load. She reports  compliance with Xarelto for >3 weeks K 3.6 yesterday but supplemented. Repeat BMET pending, ok to start Tikosyn QTc acceptable when accounting for rate and LBBB Will start Tikosyn 541mcg twice daily now Will need DCCV Thursday morning if still in AF CHADS2VASC is 4 She is intolerant of long acting Toprol, will use Cardizem and short acting Lopressor for rate control until we can achieve SR.  2.  HTN Stable No change required today   Signed, Chanetta Marshall, NP 03/15/2016 10:55 AM   I have seen, examined the patient, and reviewed the above assessment and plan.  On exam, ambulatory, IRRR.  Changes to above are made where necessary.  Will initiate tikosyn 500 mcg BID today and follow QTc closely.  Will require cardioversion if she remains in afib.  Reports compliance with xarelto.  Co Sign: Thompson Grayer, MD 03/15/2016 12:39 PM

## 2016-03-15 NOTE — Progress Notes (Signed)
Pharmacy Review for Dofetilide (Tikosyn) Initiation  Admit Complaint: 70 y.o. female admitted 03/15/2016 with atrial fibrillation to be initiated on dofetilide.   Assessment:  Patient Exclusion Criteria: If any screening criteria checked as "Yes", then  patient  should NOT receive dofetilide until criteria item is corrected. If "Yes" please indicate correction plan.  YES  NO Patient  Exclusion Criteria Correction Plan  [x]  []  Baseline QTc interval is greater than or equal to 440 msec. IF above YES box checked dofetilide contraindicated unless patient has ICD; then may proceed if QTc 500-550 msec or with known ventricular conduction abnormalities may proceed with QTc 550-600 msec. QTc = 514 on 8/28 QTc acceptable per EP when accounting for rate and LBBB  []  [x]  Magnesium level is less than 1.8 mEq/l : Last magnesium:  Lab Results  Component Value Date   MG 1.9 03/14/2016         []  [x]  Potassium level is less than 4 mEq/l : Last potassium:  Lab Results  Component Value Date   K 4.0 03/15/2016         []  [x]  Patient is known or suspected to have a digoxin level greater than 2 ng/ml: No results found for: DIGOXIN    []  [x]  Creatinine clearance less than 20 ml/min (calculated using Cockcroft-Gault, actual body weight and serum creatinine): Estimated Creatinine Clearance: 53.7 mL/min (by C-G formula based on SCr of 0.91 mg/dL).    []  [x]  Patient has received drugs known to prolong the QT intervals within the last 48 hours (phenothiazines, tricyclics or tetracyclic antidepressants, erythromycin, H-1 antihistamines, cisapride, fluoroquinolones, azithromycin). Drugs not listed above may have an, as yet, undetected potential to prolong the QT interval, updated information on QT prolonging agents is available at this website:QT prolonging agents   []  [x]  Patient received a dose of hydrochlorothiazide (O[place]) alone or in any combination including triamterene (Dyazide, Maxzide) in the last 48  hours.   []  [x]  Patient received a medication known to increase dofetilide plasma concentrations prior to initial dofetilide dose:  . Trimethoprim (Primsol, Proloprim) in the last 36 hours . Verapamil (Calan, Verelan) in the last 36 hours or a sustained release dose in the last 72 hours . Megestrol (Megace) in the last 5 days  . Cimetidine (Tagamet) in the last 6 hours . Ketoconazole (Nizoral) in the last 24 hours . Itraconazole (Sporanox) in the last 48 hours  . Prochlorperazine (Compazine) in the last 36 hours    []  [x]  Patient is known to have a history of torsades de pointes; congenital or acquired long QT syndromes.   []  [x]  Patient has received a Class 1 antiarrhythmic with less than 2 half-lives since last dose. (Disopyramide, Quinidine, Procainamide, Lidocaine, Mexiletine, Flecainide, Propafenone)   []  [x]  Patient has received amiodarone therapy in the past 3 months or amiodarone level is greater than 0.3 ng/ml.    Patient has been appropriately anticoagulated with Xarelto  Ordering provider was confirmed at [email or web address] if they are not listed on the [place] Prescribers list.  Goal of Therapy: Follow renal function, electrolytes, potential drug interactions, and dose adjustment. Provide education and 1 week supply at discharge.  Plan:  []   Physician selected initial dose within range recommended for patients level of renal function - will monitor for response.  [x]   Physician selected initial dose outside of range recommended for patients level of renal function - will discuss if the dose should be altered at this time.   Select One Calculated CrCl  Dose q12h  []  > 60 ml/min 500 mcg  [x]  40-60 ml/min 250 mcg  []  20-40 ml/min 125 mcg   Paged NP and discussed starting dose, with normal SCr EP ok with starting at 524mcg and adjusting if needed.  2. Follow up QTc after the first 5 doses, renal function, electrolytes (K & Mg) daily x 3     days, dose adjustment,  success of initiation and facilitate 1 week discharge supply as     clinically indicated.  3. Initiate Tikosyn education video (Call [phone] and ask for video # 116).  4. Place Enrollment Form on the chart for discharge supply of dofetilide.  Elenor Quinones, PharmD, BCPS Clinical Pharmacist Pager [phone] 03/15/2016 11:15 AM

## 2016-03-15 NOTE — Progress Notes (Signed)
Per Clinical biochemist for [employer] -  S/[facility] [employer] @ [place] # [phone]   1. TIKOSYN  500 MCG BID ( 30 )  NOT COVER   2 DOFETILIDE 500 MCG BID (30 )   COVER- YES  CO-PAY- $19.27 60 TAB  TIER- 4 DRUG  PRIOR APPROVAL - NO  PHARMACY : [facility], [facility], [facility]  AND [facility]

## 2016-03-16 LAB — BASIC METABOLIC PANEL
Anion gap: 9 (ref 5–15)
BUN: 15 mg/dL (ref 6–20)
CHLORIDE: 107 mmol/L (ref 101–111)
CO2: 27 mmol/L (ref 22–32)
CREATININE: 0.94 mg/dL (ref 0.44–1.00)
Calcium: 9.5 mg/dL (ref 8.9–10.3)
Glucose, Bld: 100 mg/dL — ABNORMAL HIGH (ref 65–99)
Potassium: 4.2 mmol/L (ref 3.5–5.1)
SODIUM: 143 mmol/L (ref 135–145)

## 2016-03-16 LAB — GLUCOSE, CAPILLARY
GLUCOSE-CAPILLARY: 85 mg/dL (ref 65–99)
Glucose-Capillary: 153 mg/dL — ABNORMAL HIGH (ref 65–99)
Glucose-Capillary: 181 mg/dL — ABNORMAL HIGH (ref 65–99)
Glucose-Capillary: 173 mg/dL — ABNORMAL HIGH (ref 65–99)

## 2016-03-16 LAB — MAGNESIUM: Magnesium: 1.8 mg/dL (ref 1.7–2.4)

## 2016-03-16 MED ORDER — ROSUVASTATIN CALCIUM 10 MG PO TABS
10.0000 mg | ORAL_TABLET | Freq: Every day | ORAL | Status: DC
  Administered 2016-03-16: 10 mg via ORAL
  Filled 2016-03-16: qty 1

## 2016-03-16 MED ORDER — ACETAMINOPHEN 325 MG PO TABS: 650.0000 mg | ORAL_TABLET | ORAL | Status: DC | PRN

## 2016-03-16 MED ORDER — ACETAMINOPHEN 325 MG PO TABS
650.0000 mg | ORAL_TABLET | Freq: Four times a day (QID) | ORAL | Status: DC | PRN
  Administered 2016-03-16 (×2): 650 mg via ORAL
  Filled 2016-03-16 (×2): qty 2

## 2016-03-16 MED ORDER — DOFETILIDE 250 MCG PO CAPS
375.0000 ug | ORAL_CAPSULE | Freq: Two times a day (BID) | ORAL | Status: DC
  Administered 2016-03-16 – 2016-03-17 (×2): 375 ug via ORAL
  Filled 2016-03-16 (×2): qty 1

## 2016-03-16 NOTE — Care Management Note (Signed)
Case Management Note Marvetta Gibbons RN, BSN Unit 2W-Case Manager [phone]  Patient Details  Name: Eevie Grella MRN: [id number] Date of Birth: 1946-03-13  Subjective/Objective:   Pt admitted with afib for Tikosyn load                 Action/Plan: PTA pt lived at home- anticipate return home- referral received for Tikosyn assistance- per insurance check- S/[facility] @ [place] # [phone]   1. TIKOSYN  500 MCG BID ( 30 )  NOT COVER   2 DOFETILIDE 500 MCG BID (30 )   COVER- YES  CO-PAY- $19.27 60 TAB  TIER- 4 DRUG  PRIOR APPROVAL - NO  PHARMACY : [facility], [facility], [facility]  AND [facility]   Spoke with pt at bedside- coverage info shared- per pt [facility] uses[facility]- pt will need 7 day supply of tikosyn on discharge from [facility] and [facility] with [facility]ills [facility] to order drug in [facility].   Expected Discharge Date:                  Expected Discharge Plan:  Home/Self Care  In-House Referral:     Discharge planning Services  CM Consult, Medication Assistance  Post Acute Care Choice:    Choice offered to:     DME Arranged:    DME Agency:     HH Arranged:    HH Agency:     Status of Service:  Completed, signed off  If discussed at [employer] of Stay Meetings, dates discussed:    Additional Comments:  Dawayne Patricia, RN 03/16/2016, 4:54 PM

## 2016-03-16 NOTE — Progress Notes (Signed)
todays ekg is reviewed Qt is now more prolonged Will reduce tikosyn to 360mcg BID and follow closely  Thompson Grayer MD, [facility] 03/16/2016 5:51 PM

## 2016-03-16 NOTE — Progress Notes (Addendum)
   SUBJECTIVE: The patient is doing well today.  At this time, she denies chest pain, shortness of breath, or any new concerns.  . cholecalciferol  1,000 Units Oral Daily  . diltiazem  360 mg Oral Daily  . dofetilide  500 mcg Oral BID  . fenofibrate  160 mg Oral Daily  . glimepiride  4 mg Oral Daily  . insulin aspart  0-15 Units Subcutaneous TID WC  . insulin detemir  50 Units Subcutaneous QHS  . metFORMIN  1,000 mg Oral PC supper  . metoprolol tartrate  25 mg Oral BID  . pantoprazole  40 mg Oral Daily  . potassium chloride SA  20 mEq Oral Daily  . ramipril  10 mg Oral Daily  . rivaroxaban  20 mg Oral Q supper  . sodium chloride flush  3 mL Intravenous Q12H      OBJECTIVE: Physical Exam: Vitals:   03/15/16 1004 03/15/16 1351 03/15/16 1900 03/16/16 0429  BP: (!) 154/79 100/71 131/60 (!) 142/66  Pulse: 98 66 72 61  Resp: 20 20 18 18   Temp:  98.2 F (36.8 C) 97.6 F (36.4 C) 97.8 F (36.6 C)  TempSrc: Oral Oral Oral Oral  SpO2: 99% 96% 97% 98%  Weight: 148 lb 2.4 oz (67.2 kg)     Height: 5\' 3"  (1.6 m)       Intake/Output Summary (Last 24 hours) at 03/16/16 0814 Last data filed at 03/16/16 0439  Gross per 24 hour  Intake              480 ml  Output             1250 ml  Net             -770 ml    Telemetry reveals afib->sinus rhythm-> course afib  GEN- The patient is well appearing, alert and oriented x 3 today.   Head- normocephalic, atraumatic Eyes-  Sclera clear, conjunctiva pink Ears- hearing intact Oropharynx- clear Neck- supple,   Lungs- Clear to ausculation bilaterally, normal work of breathing Heart- irregular rate and rhythm, no murmurs, rubs or gallops, PMI not laterally displaced GI- soft, NT, ND, + BS Extremities- no clubbing, cyanosis, or edema Skin- no rash or lesion Psych- euthymic mood, full affect Neuro- strength and sensation are intact  LABS: Basic Metabolic Panel:  Recent Labs  03/14/16 1307 03/15/16 1028 03/16/16 0237  NA 139 140  143  K 3.6 4.0 4.2  CL 104 106 107  CO2 28 26 27   GLUCOSE 173* 144* 100*  BUN 15 13 15   CREATININE 0.85 0.91 0.94  CALCIUM 9.4 9.9 9.5  MG 1.9  --  1.8   ASSESSMENT AND PLAN:  Active Problems:   Persistent atrial fibrillation (HCC) doing well with tikosyn qtc is 510 msec this am Will continue tikosyn.  If does not convert to sinus, would plan cardioversion tomorrow  htn Stable No change required today   Thompson Grayer, MD 03/16/2016 8:14 AM

## 2016-03-16 NOTE — Progress Notes (Signed)
Patient received 500 mcg dose of Tikosyn at 20:00 (8/29) (second dose) with QTc at 508.  QTc now at 567 reflected on 23:00 Ekg (8/29). Cardiology fellow ([place]) notified. Patient remains in sinus with BBB and resting.

## 2016-03-17 ENCOUNTER — Encounter (HOSPITAL_COMMUNITY)
Admission: RE | Disposition: A | Discharge status: Home / Self Care | Payer: Self-pay | Source: Ambulatory Visit | Attending: Internal Medicine

## 2016-03-17 LAB — BASIC METABOLIC PANEL
Anion gap: 6 (ref 5–15)
BUN: 15 mg/dL (ref 6–20)
CALCIUM: 9.2 mg/dL (ref 8.9–10.3)
CO2: 27 mmol/L (ref 22–32)
CREATININE: 0.91 mg/dL (ref 0.44–1.00)
Chloride: 107 mmol/L (ref 101–111)
GFR calc Af Amer: >60 mL/min (ref >60)
GLUCOSE: 115 mg/dL — AB (ref 65–99)
POTASSIUM: 4.1 mmol/L (ref 3.5–5.1)
SODIUM: 140 mmol/L (ref 135–145)

## 2016-03-17 LAB — GLUCOSE, CAPILLARY
GLUCOSE-CAPILLARY: 88 mg/dL (ref 65–99)
Glucose-Capillary: 92 mg/dL (ref 65–99)

## 2016-03-17 LAB — MAGNESIUM: MAGNESIUM: 2 mg/dL (ref 1.7–2.4)

## 2016-03-17 SURGERY — CARDIOVERSION
Anesthesia: Monitor Anesthesia Care

## 2016-03-17 MED ORDER — SODIUM CHLORIDE 0.9% FLUSH: 3.0000 mL | INTRAVENOUS | Status: DC | PRN

## 2016-03-17 MED ORDER — DOFETILIDE 125 MCG PO CAPS: 375.0000 ug | ORAL_CAPSULE | Freq: Two times a day (BID) | ORAL | 3 refills | Status: DC

## 2016-03-17 MED ORDER — HYDROCORTISONE 1 % EX CREA
1.0000 | TOPICAL_CREAM | Freq: Three times a day (TID) | CUTANEOUS | Status: DC | PRN
Start: 2016-03-17 — End: 2016-03-17

## 2016-03-17 MED ORDER — SODIUM CHLORIDE 0.9% FLUSH
3.0000 mL | Freq: Two times a day (BID) | INTRAVENOUS | Status: DC
  Administered 2016-03-17: 3 mL via INTRAVENOUS

## 2016-03-17 MED ORDER — SODIUM CHLORIDE 0.9 % IV SOLN
250.0000 mL | INTRAVENOUS | Status: DC
  Administered 2016-03-17: 250 mL via INTRAVENOUS

## 2016-03-17 NOTE — Progress Notes (Signed)
   SUBJECTIVE: The patient is doing well today.  At this time, she denies chest pain, continues to feel SOB with the AF  . cholecalciferol  1,000 Units Oral Daily  . diltiazem  360 mg Oral Daily  . dofetilide  375 mcg Oral BID  . fenofibrate  160 mg Oral Daily  . glimepiride  4 mg Oral Daily  . insulin aspart  0-15 Units Subcutaneous TID WC  . insulin detemir  50 Units Subcutaneous QHS  . metFORMIN  1,000 mg Oral PC supper  . metoprolol tartrate  25 mg Oral BID  . pantoprazole  40 mg Oral Daily  . potassium chloride SA  20 mEq Oral Daily  . ramipril  10 mg Oral Daily  . rivaroxaban  20 mg Oral Q supper  . rosuvastatin  10 mg Oral q1800  . sodium chloride flush  3 mL Intravenous Q12H  . sodium chloride flush  3 mL Intravenous Q12H   . sodium chloride      OBJECTIVE: Physical Exam: Vitals:   03/16/16 1014 03/16/16 1359 03/16/16 1951 03/17/16 0641  BP: 140/65 (!) 111/53 (!) 108/56 122/71  Pulse:  62 78 73  Resp:  18 18 18   Temp:  98.7 F (37.1 C) 98.5 F (36.9 C) 98.5 F (36.9 C)  TempSrc:  Oral Oral Oral  SpO2:  98% 96% 97%  Weight:      Height:        Intake/Output Summary (Last 24 hours) at 03/17/16 [id number] Last data filed at 03/16/16 1953  Gross per 24 hour  Intake              720 ml  Output             1951 ml  Net            -1231 ml    Telemetry reveals intermittent afib and sinus  GEN- The patient is well appearing, alert and oriented x 3 today.   Head- normocephalic, atraumatic Eyes-  Sclera clear, conjunctiva pink Ears- hearing intact Oropharynx- clear Neck- supple,   Lungs- Clear to ausculation bilaterally, normal work of breathing Heart- regular rate and rhythm, no murmurs, rubs or gallops, PMI not laterally displaced GI- soft, NT, ND Extremities- no clubbing, cyanosis, or edema Skin- no rash or lesion Psych- euthymic mood, full affect Neuro- strength and sensation are intact  LABS: Basic Metabolic Panel:  Recent Labs  03/16/16 0237  03/17/16 0144  NA 143 140  K 4.2 4.1  CL 107 107  CO2 27 27  GLUCOSE 100* 115*  BUN 15 15  CREATININE 0.94 0.91  CALCIUM 9.5 9.2  MG 1.8 2.0   ASSESSMENT AND PLAN:  Active Problems:   Persistent atrial fibrillation (HCC)  1. Persistent AFib     CHA2DS2Vasc is at least 4 on xarelto     K+ 4.1     Mag 2.0     Creat 0.91     QTc 444ms on 355mcg dose last evening  Intermittent afib and sinus  2.  HTN Stable No change required today  3. DM     Continue current regime  Baldwin Jamaica, MD 03/17/2016 9:27 AM  QTc stable Now in sinus Will discharge with close outpatient follow-up  Thompson Grayer MD, [facility] 03/17/2016

## 2016-03-17 NOTE — Progress Notes (Signed)
EKG done 3 hours after PM dose of tikosyn, did not load to EPIC, placed in patient's chart.

## 2016-03-17 NOTE — Progress Notes (Signed)
Patient to D/C home with friend. Handout given on tikosyn with teachback method. IV removed. Tele monitor removed. CCMD notified. Personal belongings given to patient. Tikosyn prescription filled. Patient taken out to D/C with family.   Domingo Dimes RN

## 2016-03-17 NOTE — Discharge Summary (Signed)
ELECTROPHYSIOLOGY PROCEDURE DISCHARGE SUMMARY    Patient ID: Isabella Wright,  MRN: [id number], DOB/AGE: Sep 28, 1945 70 y.o.  Admit date: 03/15/2016 Discharge date: 03/17/2016  Primary Care Physician: Velna Hatchet, MD Primary Cardiologist: Dr. Ellyn Hack Electrophysiologist: Dr. Rayann Heman  Primary Discharge Diagnosis:  1.  Persistent atrial fibrillation status post Tikosyn loading this admission      CHA2DS2Vasc is at least 4, on Xarelto  Secondary Discharge Diagnosis:  1. HTN 2. DM 3. LBBB  Allergies  Allergen Reactions  . Fluorescein Anaphylaxis    Pt states she had swelling of facial tissue with SOB and itching with a rapid heart rate.  . Exenatide Nausea And Vomiting  . Liraglutide Nausea And Vomiting  . Demerol [Meperidine] Nausea And Vomiting     Procedures This Admission:  1.  Tikosyn loading  Brief HPI: Isabella Wright is a 70 y.o. female with a past medical history as noted above.  They were referred to EP in the outpatient setting for treatment options of atrial fibrillation.  Risks, benefits, and alternatives to Tikosyn were reviewed with the patient who wished to proceed.    Hospital Course:  The patient was admitted and Tikosyn was initiated.  Renal function and electrolytes were followed during the hospitalization.  Their QTc became prolonged her dose reduced to 323mcg BID, after her 5th dose, her EKG reviewed by Dr. Rayann Heman, QTc is stable.   They were monitored until discharge on telemetry which demonstrated SR.  On the day of discharge, they were examined by Dr Rayann Heman who considered them stable for discharge to home.  Follow-up has been arranged with AFib clinic in 1 week for EKG, labs, and visit and with Dr Rayann Heman in 4 weeks.   Physical Exam: Vitals:   03/16/16 1359 03/16/16 1951 03/17/16 0641 03/17/16 0946  BP: (!) 111/53 (!) 108/56 122/71 125/74  Pulse: 62 78 73   Resp: 18 18 18    Temp: 98.7 F (37.1 C) 98.5 F (36.9 C) 98.5 F (36.9 C)   TempSrc: Oral Oral  Oral   SpO2: 98% 96% 97%   Weight:      Height:         Labs:  No results found for: WBC, HGB, HCT, MCV, PLT   Recent Labs Lab 03/17/16 0144  NA 140  K 4.1  CL 107  CO2 27  BUN 15  CREATININE 0.91  CALCIUM 9.2  GLUCOSE 115*     Discharge Medications:    Medication List    TAKE these medications   cholecalciferol 1000 units tablet Commonly known as:  VITAMIN D Take 1,000 Units by mouth daily.   diltiazem 360 MG 24 hr capsule Commonly known as:  CARDIZEM CD Take 1 capsule (360 mg total) by mouth daily.   dofetilide 125 MCG capsule Commonly known as:  TIKOSYN Take 3 capsules (375 mcg total) by mouth 2 (two) times daily.   fenofibrate 160 MG tablet Take 160 mg by mouth daily.   glimepiride 4 MG tablet Commonly known as:  AMARYL Take 1 tablet by mouth daily.   LEVEMIR FLEXTOUCH 100 UNIT/ML Pen Generic drug:  Insulin Detemir Inject 50 Units into the skin at bedtime.   metFORMIN 500 MG 24 hr tablet Commonly known as:  GLUCOPHAGE-XR 1000 MG TAKE IN THE EVENING   metoprolol tartrate 25 MG tablet Commonly known as:  LOPRESSOR Take 1 tablet by mouth every 6 hours as needed for fast heart rates   NOVOLOG FLEXPEN [place] Inject 10 Units into the  skin.   omega-3 acid ethyl esters 1 g capsule Commonly known as:  LOVAZA Take 2 g by mouth 2 (two) times daily.   omeprazole 20 MG tablet Commonly known as:  PRILOSEC OTC Take 20 mg by mouth daily.   potassium chloride SA 20 MEQ tablet Commonly known as:  K-DUR,KLOR-CON Take 1 tablet (20 mEq total) by mouth daily.   PRESERVISION AREDS 2 PO Take 1 tablet by mouth 2 (two) times daily.   ramipril 10 MG capsule Commonly known as:  ALTACE Take 10 mg by mouth daily.   rivaroxaban 20 MG Tabs tablet Commonly known as:  XARELTO Take 1 tablet (20 mg total) by mouth daily with supper.   rosuvastatin 10 MG tablet Commonly known as:  CRESTOR TAKE 1 TABLET (10 MG TOTAL) BY MOUTH DAILY.   TRULICITY [id number]  Sopn Generic drug:  Dulaglutide Inject 0.75 mg into the skin once a week. Thursday       Disposition:  Home Discharge Instructions    Diet - low sodium heart healthy    Complete by:  As directed   Increase activity slowly    Complete by:  As directed     Follow-up Information    [facility] Follow up on 03/25/2016.   Specialty:  Cardiology Why:  8:30AM Contact information: [address] [id number] [place] [phone]       Thompson Grayer, MD Follow up on 04/13/2016.   Specialty:  Cardiology Why:  12:00PM, noon Contact information: [place] [address] [phone]           Duration of Discharge Encounter: Greater than 30 minutes including physician time.  Signed, Tommye Standard, PA-C 03/17/2016 11:23 AM   I have seen, examined the patient, and reviewed the above assessment and plan on exam, RRR..  Changes to above are made where necessary.  QT remains stable at discharge.  She has sinus rhythm currently but has had intermittent afib while here.  May be a good candidate for ablation if afib persists.  Will need close follow-up in the AF clinic.  Co Sign: Thompson Grayer, MD 03/17/2016 1:57 PM

## 2016-03-17 NOTE — Discharge Instructions (Signed)

## 2016-03-17 NOTE — Discharge Summary (Signed)
Please refer to  progress note 03/17/16, 11:15AM for discharge summary.  Tommye Standard, PA-C

## 2016-03-25 ENCOUNTER — Encounter (HOSPITAL_COMMUNITY): Payer: Self-pay | Admitting: Nurse Practitioner

## 2016-03-25 ENCOUNTER — Ambulatory Visit (HOSPITAL_COMMUNITY)
Admission: RE | Admit: 2016-03-25 | Discharge: 2016-03-25 | Disposition: A | Discharge status: Home / Self Care | Payer: Medicare Other | Source: Ambulatory Visit | Attending: Nurse Practitioner | Admitting: Nurse Practitioner

## 2016-03-25 VITALS — BP 130/78 | HR 93 | Ht 63.0 in | Wt 149.4 lb

## 2016-03-25 DIAGNOSIS — Z79899 Other long term (current) drug therapy: Secondary | ICD-10-CM | POA: Diagnosis not present

## 2016-03-25 DIAGNOSIS — I480 Paroxysmal atrial fibrillation: Secondary | ICD-10-CM | POA: Insufficient documentation

## 2016-03-25 DIAGNOSIS — I481 Persistent atrial fibrillation: Secondary | ICD-10-CM | POA: Diagnosis not present

## 2016-03-25 DIAGNOSIS — H3530 Unspecified macular degeneration: Secondary | ICD-10-CM | POA: Diagnosis not present

## 2016-03-25 DIAGNOSIS — E119 Type 2 diabetes mellitus without complications: Secondary | ICD-10-CM | POA: Insufficient documentation

## 2016-03-25 DIAGNOSIS — K219 Gastro-esophageal reflux disease without esophagitis: Secondary | ICD-10-CM | POA: Insufficient documentation

## 2016-03-25 DIAGNOSIS — I447 Left bundle-branch block, unspecified: Secondary | ICD-10-CM | POA: Insufficient documentation

## 2016-03-25 DIAGNOSIS — I10 Essential (primary) hypertension: Secondary | ICD-10-CM | POA: Diagnosis not present

## 2016-03-25 DIAGNOSIS — K449 Diaphragmatic hernia without obstruction or gangrene: Secondary | ICD-10-CM | POA: Insufficient documentation

## 2016-03-25 DIAGNOSIS — Z7901 Long term (current) use of anticoagulants: Secondary | ICD-10-CM | POA: Insufficient documentation

## 2016-03-25 DIAGNOSIS — Z87891 Personal history of nicotine dependence: Secondary | ICD-10-CM | POA: Diagnosis not present

## 2016-03-25 DIAGNOSIS — Z794 Long term (current) use of insulin: Secondary | ICD-10-CM | POA: Diagnosis not present

## 2016-03-25 DIAGNOSIS — Z9889 Other specified postprocedural states: Secondary | ICD-10-CM | POA: Diagnosis not present

## 2016-03-25 DIAGNOSIS — Z888 Allergy status to other drugs, medicaments and biological substances status: Secondary | ICD-10-CM | POA: Diagnosis not present

## 2016-03-25 DIAGNOSIS — I4819 Other persistent atrial fibrillation: Secondary | ICD-10-CM

## 2016-03-25 LAB — BASIC METABOLIC PANEL
ANION GAP: 7 (ref 5–15)
BUN: 14 mg/dL (ref 6–20)
CALCIUM: 10 mg/dL (ref 8.9–10.3)
CO2: 27 mmol/L (ref 22–32)
Chloride: 106 mmol/L (ref 101–111)
Creatinine, Ser: 0.77 mg/dL (ref 0.44–1.00)
GFR calc Af Amer: >60 mL/min (ref >60)
GFR calc non Af Amer: >60 mL/min (ref >60)
GLUCOSE: 186 mg/dL — AB (ref 65–99)
POTASSIUM: 4.1 mmol/L (ref 3.5–5.1)
Sodium: 140 mmol/L (ref 135–145)

## 2016-03-25 LAB — CBC
HCT: 42.4 % (ref 36.0–46.0)
HEMOGLOBIN: 13.6 g/dL (ref 12.0–15.0)
MCH: 26.7 pg (ref 26.0–34.0)
MCHC: 32.1 g/dL (ref 30.0–36.0)
MCV: 83.3 fL (ref 78.0–100.0)
PLATELETS: 212 10*3/uL (ref 150–400)
RBC: 5.09 MIL/uL (ref 3.87–5.11)
RDW: 14.7 % (ref 11.5–15.5)
WBC: 5 10*3/uL (ref 4.0–10.5)

## 2016-03-25 LAB — MAGNESIUM: Magnesium: 1.9 mg/dL (ref 1.7–2.4)

## 2016-03-25 NOTE — Progress Notes (Addendum)
Patient ID: Isabella Wright, female   DOB: Jul 15, 1946, 70 y.o.   MRN: [id number]     Primary Care Physician: Velna Hatchet, MD Referring Physician: Dr. Jena Gauss [place] is a 70 y.o. female with a h/o DM, HTN, hypertriglyceridemia, LBBB,previuosly worked up in [place] ,[place], episodes of tachycardia for many years, but dx as afib 12/2015. She is currently wearing an event monitor. She has had strips showing afib with rvr up to 150-170 bpm. Longest episode reported in EPIC lastest 30 mins. Pt states she was unaware of having afib when the monitor company called to check on her. She is on xarelto with  a chadsvasc score of at least 4. 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 He had previously been on amiodarone, which made him very ill, and tikosyn which controlled the afib and he tolerated well, until it was stopped due to long QT.  She denies tobacco abuse, alcohol use, minimal caffeine. Snores some but does not think to a significant degree. Exercises on a regular basis.Has been on Inderal for years, uses more prn than daily. Is also on diltiazem. Metoprolol daily added.  Returns to afib clinic, 8/4. Event monitor finished and showed 55% afib burden. She is in afib today with rvr. Some days she feels well and other days, she feels short of breath with activities. She enjoys golfing but has refrained this summer for not feeling like she has the energy to play. Recent echo showed normal EF with mildly dilated  left atrium. She had a stress test which was low risk.  F/u 9/8, after consult with Dr. Rayann Heman, she was admitted for tikosyn. She was  loaded on tikosyn and left the hospital on 375 mg bid in SR. Unfortunately, she is in afib today in the afib clininc. She felt really good for the first few days but for the last two days has felt very  poorly with fatigue.  Today, she denies symptoms of palpitations, chest pain, orthopnea, PND, lower extremity edema, dizziness, presyncope, syncope, or neurologic sequela. Positive for fatigue and shortness of breath at times. The patient is tolerating medications without difficulties and is otherwise without complaint today.   Past Medical History:  Diagnosis Date  . 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 Diabetes mellitus type 2, controlled ([facility]) 11/13/2010  . GERD (gastroesophageal reflux disease)   . History of hiatal hernia   . Hypertension 2000   2009: Renal Dopplers showed R RA ostial stenosis -- (told not a problem or 20 yrs)  . Hypertriglyceridemia 1996   Very labile and difficult control: Began in 11/13/1994 when she began taking Premarin after hysterectomy -- triglycerides increased to 3582 neuropathy in her feet> care for by endocrinologis; levels vary based on stress.;; Labs from March 2015: TC 150, TG 745, HDL 26, LDL 45  . Kidney stones   . LBBB (left bundle branch block) 11/13/2007   Initially diagnosed as rate related during stress echo.  . Macular degeneration    "dry in right; treating like wet in the left" (03/15/2016)  . New onset atrial fibrillation ([facility]) 01/04/2016   This patients CHA2DS2-VASc Score and unadjusted Ischemic Stroke Rate (% per year) is equal to 4.8 % stroke rate/year from a score of 4 -- 1 point each: HTN, DM, Age  if 65-74, or Female]     . Paroxysmal SVT (supraventricular tachycardia) ([facility]) 2009   less frequent following I&D of chest wall abscess (Jan 015), not previously documented, may have been afib.  . Sarcoma ([facility]) 2015   dermatofibro sarcoma protuberens left groin   Past Surgical History:  Procedure Laterality Date  . Abdominal Doppler ultrasound  February 2009    Right ostial renal artery stenosis; normal renal structure.  . APPENDECTOMY  1968  . CYSTOSCOPY W/ URETEROSCOPY W/ LITHOTRIPSY   1999   Large calcium oxalate stone    . CYSTOSCOPY/RETROGRADE/URETEROSCOPY/STONE EXTRACTION WITH BASKET  1972  . EXCISIONAL HEMORRHOIDECTOMY   1981  . FRACTURE SURGERY    . IRRIGATION AND DEBRIDEMENT SEBACEOUS CYST  January 2015    chest wall; with antibiotics --> following this treatment, SVT episodes became much less frequent.  . ORIF PROXIMAL TIBIAL PLATEAU FRACTURE Left 2007   , Related shattered left tibial plateau with reattachment of torn ligaments and insertion of titanium plate and screws.  Marland Kitchen RENAL ARTERY DUPLEX  01/02/2014   R&L RA - mildly elevated velocities - < 60%; Bilateral Kidneys - normal shape & size.  Marland Kitchen SARCOMA EXCISION Left 2015   dermatofibro sarcoma protuberens, wide excision of groin from front side to buttocks"  . SOFT TISSUE BIOPSY Left 2015  . TOTAL ABDOMINAL HYSTERECTOMY  1994  . TRANSTHORACIC ECHOCARDIOGRAM  2013   normal  . TRANSTHORACIC ECHOCARDIOGRAM  March 2015   aortic sclerosis; without stenosis. Normal EF  . TREADMILL STRESS ECHO  01/2009   No regional wall motion abnormalities with stress == non-ischemic; rate related incomplete left bundle branch block    Current Outpatient Prescriptions  Medication Sig Dispense Refill  . cholecalciferol (VITAMIN D) 1000 UNITS tablet Take 1,000 Units by mouth daily.    Marland Kitchen diltiazem (CARDIZEM CD) 360 MG 24 hr capsule Take 1 capsule (360 mg total) by mouth daily. 90 capsule 3  . dofetilide (TIKOSYN) 125 MCG capsule Take 3 capsules (375 mcg total) by mouth 2 (two) times daily. 180 capsule 3  . Dulaglutide (TRULICITY) [id number] [id number] SOPN Inject 0.75 mg into the skin once a week. Thursday    . fenofibrate 160 MG tablet Take 160 mg by mouth daily.     Marland Kitchen glimepiride (AMARYL) 4 MG tablet Take 1 tablet by mouth daily.  5  . Insulin Aspart (NOVOLOG FLEXPEN [place]) Inject 10 Units into the skin.    . Insulin Detemir (LEVEMIR FLEXTOUCH) 100 UNIT/ML Pen Inject 50 Units into the skin at bedtime.     . metFORMIN (GLUCOPHAGE-XR) 500 MG 24 hr tablet 1000 MG TAKE IN THE  EVENING    . metoprolol tartrate (LOPRESSOR) 25 MG tablet Take 1 tablet by mouth every 6 hours as needed for fast heart rates 180 tablet 3  . Multiple Vitamins-Minerals (PRESERVISION AREDS 2 PO) Take 1 tablet by mouth 2 (two) times daily.     Marland Kitchen omega-3 acid ethyl esters (LOVAZA) 1 G capsule Take 2 g by mouth 2 (two) times daily.     Marland Kitchen omeprazole (PRILOSEC OTC) 20 MG tablet Take 20 mg by mouth daily.    . potassium chloride SA (K-DUR,KLOR-CON) 20 MEQ tablet Take 1 tablet (20 mEq total) by mouth daily. 30 tablet 0  . ramipril (ALTACE) 10 MG capsule Take 10 mg by mouth daily.     . rivaroxaban (XARELTO) 20 MG TABS tablet Take 1 tablet (20 mg total) by mouth daily with supper. 30 tablet 11  . rosuvastatin (  CRESTOR) 10 MG tablet TAKE 1 TABLET (10 MG TOTAL) BY MOUTH DAILY. 30 tablet 6   No current facility-administered medications for this encounter.     Allergies  Allergen Reactions  . Fluorescein Anaphylaxis    Pt states she had swelling of facial tissue with SOB and itching with a rapid heart rate.  . Exenatide Nausea And Vomiting  . Liraglutide Nausea And Vomiting  . Demerol [Meperidine] Nausea And Vomiting    Social History   Social History  . Marital status: Widowed    Spouse name: N/A  . Number of children: N/A  . Years of education: N/A   Occupational History  . Not on file.   Social History Main Topics  . Smoking status: Former Smoker    Packs/day: 0.50    Years: 15.00    Types: Cigarettes  . 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 Alcohol use No  . Drug use: No  . Sexual activity: Not Currently   Other Topics Concern  . 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 She is now widowed for just under 2 years. Her husband died of cancer. They do not have any children.   She recently (spring of 2015) moved to [place] to  be closer to her brother, Isabella Wright and his family.   She been the caregiver for her ailing husband for 5 years. He passed away in 02-21-2012, and she has had prolonged period of grieving partly due to her having lost 3 members of her immediate family during that time period as well.  --- She is under a lot of stress      She is a former smoker quit years ago. She does not drink alcohol. She is active, but not routine exercise. She says that she tries to eat healthy and knows what usually affects her triglyceride levels.    Family History  Problem Relation Age of Onset  . Heart disease Brother     Died at 66  . Heart disease Mother     Died at 41  . Heart disease Father     Died at 83  . Breast cancer Sister     Died at 36    ROS- All systems are reviewed and negative except as per the HPI above  Physical Exam: There were no vitals filed for this visit.  GEN- The patient is well appearing, alert and oriented x 3 today.   Head- normocephalic, atraumatic Eyes-  Sclera clear, conjunctiva pink Ears- hearing intact Oropharynx- clear Neck- supple, no JVP Lymph- no cervical lymphadenopathy Lungs- Clear to ausculation bilaterally, normal work of breathing Heart-Irregular rate and rhythm, no murmurs, rubs or gallops, PMI not laterally displaced GI- soft, NT, ND, + BS Extremities- no clubbing, cyanosis, or edema MS- no significant deformity or atrophy Skin- no rash or lesion Psych- euthymic mood, full affect Neuro- strength and sensation are intact  EKG- afib at 93 bpm, qrs int 138 ms, qtc 544 ms( prolonged but may look worse in afib, will ask Dr.Allred to review) Epic records reviewed  ECHO- 12/2015-Left ventricle: The cavity size was normal. Wall thickness was  increased in a pattern of mild LVH. Systolic function was low  normal to mildly reduced. The estimated ejection fraction was in  the range of 50% to 55%.  Septal-lateral dyssynchrony.  Indeterminant diastolic function  (atrial fibrillation). - Aortic valve: There was no stenosis. - Mitral valve: There was trivial regurgitation. - Left atrium: The atrium was mildly dilated. - Right ventricle: The cavity size was normal. Systolic function  was normal. - Tricuspid valve: Peak RV-RA gradient (S): 25 mm Hg. - Pulmonary arteries: PA peak pressure: 28 mm Hg (S). - Inferior vena cava: The vessel was normal in size. The  respirophasic diameter changes were in the normal range (>= 50%),  consistent with normal central venous pressure.  Impressions:  - Normal LV size with mild LV hypertrophy. EF 50-55%,  septal-lateral dyssynchrony. Normal RV size and systolic  function. No significant valvular abnormalities. Labs, Trigs 456, Creat- 0.90, Potassium 4.0   Stress myoview-This is a low risk study.  Findings consistent with prior myocardial infarction.   Small mid-ventricular septal and anterior wall defect  No ischemia. May be related to LBBB Nol EF calculated due to rapid afib  30 day event monitor showed 55% afib burden  Assessment and Plan: 1. PAF S/p tikosyn load but still has intermittent SR but pt feels like she has had afib for  last two days For now, continue tikosyn 325 mg bid Will bring back next week to see if cardioversion is needed If fails tikosyn has appointment 9/27 with Dr. Rayann Heman and may need to discuss ablation Bmet, mag,cbc drawn    F/u afib clinic one week or sooner if needed   Butch Penny C. Caroll Cunning[place], [facility] [address] [place], [place] [address] [phone]

## 2016-03-29 ENCOUNTER — Other Ambulatory Visit: Payer: Self-pay | Admitting: Cardiology

## 2016-03-31 ENCOUNTER — Ambulatory Visit (HOSPITAL_COMMUNITY)
Admission: RE | Admit: 2016-03-31 | Discharge: 2016-03-31 | Disposition: A | Discharge status: Home / Self Care | Payer: Medicare Other | Source: Ambulatory Visit | Attending: Nurse Practitioner | Admitting: Nurse Practitioner

## 2016-03-31 ENCOUNTER — Encounter (HOSPITAL_COMMUNITY): Payer: Self-pay | Admitting: Nurse Practitioner

## 2016-03-31 VITALS — BP 114/64 | HR 66 | Ht 63.0 in | Wt 149.4 lb

## 2016-03-31 DIAGNOSIS — E781 Pure hyperglyceridemia: Secondary | ICD-10-CM | POA: Insufficient documentation

## 2016-03-31 DIAGNOSIS — Z9889 Other specified postprocedural states: Secondary | ICD-10-CM | POA: Insufficient documentation

## 2016-03-31 DIAGNOSIS — I481 Persistent atrial fibrillation: Secondary | ICD-10-CM | POA: Diagnosis not present

## 2016-03-31 DIAGNOSIS — H3530 Unspecified macular degeneration: Secondary | ICD-10-CM | POA: Diagnosis not present

## 2016-03-31 DIAGNOSIS — Z794 Long term (current) use of insulin: Secondary | ICD-10-CM | POA: Diagnosis not present

## 2016-03-31 DIAGNOSIS — Z7901 Long term (current) use of anticoagulants: Secondary | ICD-10-CM | POA: Insufficient documentation

## 2016-03-31 DIAGNOSIS — K219 Gastro-esophageal reflux disease without esophagitis: Secondary | ICD-10-CM | POA: Diagnosis not present

## 2016-03-31 DIAGNOSIS — I4819 Other persistent atrial fibrillation: Secondary | ICD-10-CM

## 2016-03-31 DIAGNOSIS — E119 Type 2 diabetes mellitus without complications: Secondary | ICD-10-CM | POA: Diagnosis not present

## 2016-03-31 DIAGNOSIS — I480 Paroxysmal atrial fibrillation: Secondary | ICD-10-CM | POA: Diagnosis not present

## 2016-03-31 DIAGNOSIS — Z87891 Personal history of nicotine dependence: Secondary | ICD-10-CM | POA: Insufficient documentation

## 2016-03-31 DIAGNOSIS — Z888 Allergy status to other drugs, medicaments and biological substances status: Secondary | ICD-10-CM | POA: Diagnosis not present

## 2016-03-31 DIAGNOSIS — I10 Essential (primary) hypertension: Secondary | ICD-10-CM | POA: Insufficient documentation

## 2016-03-31 DIAGNOSIS — I447 Left bundle-branch block, unspecified: Secondary | ICD-10-CM | POA: Insufficient documentation

## 2016-03-31 DIAGNOSIS — K449 Diaphragmatic hernia without obstruction or gangrene: Secondary | ICD-10-CM | POA: Insufficient documentation

## 2016-03-31 DIAGNOSIS — Z79899 Other long term (current) drug therapy: Secondary | ICD-10-CM | POA: Insufficient documentation

## 2016-03-31 NOTE — Progress Notes (Signed)
Patient ID: Isabella Wright, female   DOB: 1946/04/23, 70 y.o.   MRN: [id number]     Primary Care Physician: Isabella Hatchet, MD Referring Physician: Dr. Jena Gauss Wright is a 70 y.o. female with a h/o DM, HTN, hypertriglyceridemia, LBBB,previuosly worked up in [place], episodes of tachycardia for many years, but dx as afib 12/2015. She is currently wearing an event monitor. She has had strips showing afib with rvr up to 150-170 bpm. Longest episode reported in EPIC lastest 30 mins. Pt states she was unaware of having afib when the monitor company called to check on her. She is on xarelto with  a chadsvasc score of at least 4. 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 He had previously been on amiodarone, which made him very ill, and tikosyn which controlled the afib and he tolerated well, until it was stopped Wright to long QT.  She denies tobacco abuse, alcohol use, minimal caffeine. Snores some but does not think to a significant degree. Exercises on a regular basis.Has been on Inderal for years, uses more prn than daily. Is also on diltiazem. Metoprolol daily added.  Returns to afib clinic, 8/4. Event monitor finished and showed 55% afib burden. She is in afib today with rvr. Some days she feels well and other days, she feels short of breath with activities. She enjoys golfing but has refrained this summer for not feeling like she has the energy to play. Recent echo showed normal EF with mildly dilated  left atrium. She had a stress test which was low risk.  F/u 9/8, after consult with Dr. Rayann Wright, she was admitted for tikosyn. She was  loaded on tikosyn and left the hospital on 375 mg bid in SR. Unfortunately, she is in afib today in the afib clininc. She felt really good for the first few days but for the last two days has felt very  poorly with fatigue.  Returns 9/14, she is in [place], but feels that she was in afib most of the am. Has had good days and bad days since [name].  She feels that Isabella Wright is not quite doing the job of keeping in [place], and ablation is discussed, which she is interested in and will discuss further with Dr. Rayann Wright 9/27.  Today, she denies symptoms of palpitations, chest pain, orthopnea, PND, lower extremity edema, dizziness, presyncope, syncope, or neurologic sequela. Positive for fatigue and shortness of breath at times. The patient is tolerating medications without difficulties and is otherwise without complaint today.   Past Medical History:  Diagnosis Date  . 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 Diabetes mellitus type 2, controlled ([place]) October 26, 2010  . GERD (gastroesophageal reflux disease)   . History of hiatal hernia   . Hypertension 2000   2009: Renal Dopplers showed R RA ostial stenosis -- (told not a problem or 20 yrs)  . Hypertriglyceridemia 1996   Very labile and difficult control: Began in 10-26-1994 when she began taking Premarin after hysterectomy -- triglycerides increased to 3582 neuropathy in her feet> care for by endocrinologis; levels vary based on stress.;; Labs from March 2015: TC 150, TG 745, HDL 26, LDL 45  . Kidney stones   . LBBB (left bundle branch block) Oct 26, 2007   Initially diagnosed as rate related during stress  echo.  . Macular degeneration    "dry in right; treating like wet in the left" (03/15/2016)  . New onset atrial fibrillation ([place]) 01/04/2016   This patients CHA2DS2-VASc Score and unadjusted Ischemic Stroke Rate (% per year) is equal to 4.8 % stroke rate/year from a score of 4 -- 1 point each: HTN, DM, Age if 70-74, or Female]     . Paroxysmal SVT (supraventricular tachycardia) ([place]) 2009   less frequent following I&D of chest wall abscess (Jan 015), not previously documented, may have been afib.  . Sarcoma ([place]) 2015   dermatofibro sarcoma  protuberens left groin   Past Surgical History:  Procedure Laterality Date  . Abdominal Doppler ultrasound  February 2009    Right ostial renal artery stenosis; normal renal structure.  . APPENDECTOMY  1968  . CYSTOSCOPY W/ URETEROSCOPY W/ LITHOTRIPSY   1999   Large calcium oxalate stone  . CYSTOSCOPY/RETROGRADE/URETEROSCOPY/STONE EXTRACTION WITH BASKET  1972  . EXCISIONAL HEMORRHOIDECTOMY   1981  . FRACTURE SURGERY    . IRRIGATION AND DEBRIDEMENT SEBACEOUS CYST  January 2015    chest wall; with antibiotics --> following this treatment, SVT episodes became much less frequent.  . ORIF PROXIMAL TIBIAL PLATEAU FRACTURE Left 2007   , Related shattered left tibial plateau with reattachment of torn ligaments and insertion of titanium plate and screws.  Marland Kitchen RENAL ARTERY DUPLEX  01/02/2014   R&L RA - mildly elevated velocities - < 60%; Bilateral Kidneys - normal shape & size.  Marland Kitchen SARCOMA EXCISION Left 2015   dermatofibro sarcoma protuberens, wide excision of groin from front side to buttocks"  . SOFT TISSUE BIOPSY Left 2015  . TOTAL ABDOMINAL HYSTERECTOMY  1994  . TRANSTHORACIC ECHOCARDIOGRAM  2013   normal  . TRANSTHORACIC ECHOCARDIOGRAM  March 2015   aortic sclerosis; without stenosis. Normal EF  . TREADMILL STRESS ECHO  01/2009   No regional wall motion abnormalities with stress == non-ischemic; rate related incomplete left bundle branch block    Current Outpatient Prescriptions  Medication Sig Dispense Refill  . cholecalciferol (VITAMIN D) 1000 UNITS tablet Take 1,000 Units by mouth daily.    Marland Kitchen diltiazem (CARDIZEM CD) 360 MG 24 hr capsule Take 1 capsule (360 mg total) by mouth daily. 90 capsule 3  . dofetilide (TIKOSYN) 125 MCG capsule Take 3 capsules (375 mcg total) by mouth 2 (two) times daily. 180 capsule 3  . Dulaglutide (TRULICITY) A999333 0000000 SOPN Inject 0.75 mg into the skin once a week. Thursday    . fenofibrate 160 MG tablet Take 160 mg by mouth daily.     Marland Kitchen glimepiride  (AMARYL) 4 MG tablet Take 1 tablet by mouth daily.  5  . hydrochlorothiazide (HYDRODIURIL) 25 MG tablet TAKE 1 TABLET (25 MG TOTAL) BY MOUTH DAILY. 90 tablet 2  . Insulin Aspart (NOVOLOG FLEXPEN [place]) Inject 10 Units into the skin.    . Insulin Detemir (LEVEMIR FLEXTOUCH) 100 UNIT/ML Pen Inject 50 Units into the skin at bedtime.     . metFORMIN (GLUCOPHAGE-XR) 500 MG 24 hr tablet 1000 MG TAKE IN THE EVENING    . metoprolol tartrate (LOPRESSOR) 25 MG tablet Take 1 tablet by mouth every 6 hours as needed for fast heart rates 180 tablet 3  . Multiple Vitamins-Minerals (PRESERVISION AREDS 2 PO) Take 1 tablet by mouth 2 (two) times daily.     Marland Kitchen omega-3 acid ethyl esters (LOVAZA) 1 G capsule Take 2 g by mouth 2 (two) times daily.     Marland Kitchen  omeprazole (PRILOSEC OTC) 20 MG tablet Take 20 mg by mouth daily.    . potassium chloride SA (K-DUR,KLOR-CON) 20 MEQ tablet Take 1 tablet (20 mEq total) by mouth daily. 30 tablet 0  . ramipril (ALTACE) 10 MG capsule Take 10 mg by mouth daily.     . rivaroxaban (XARELTO) 20 MG TABS tablet Take 1 tablet (20 mg total) by mouth daily with supper. 30 tablet 11  . rosuvastatin (CRESTOR) 10 MG tablet TAKE 1 TABLET (10 MG TOTAL) BY MOUTH DAILY. 30 tablet 6   No current facility-administered medications for this encounter.     Allergies  Allergen Reactions  . Fluorescein Anaphylaxis    Pt states she had swelling of facial tissue with SOB and itching with a rapid heart rate.  . Exenatide Nausea And Vomiting  . Liraglutide Nausea And Vomiting  . Demerol [Meperidine] Nausea And Vomiting    Social History   Social History  . Marital status: Widowed    Spouse name: N/A  . Number of children: N/A  . Years of education: N/A   Occupational History  . Not on file.   Social History Main Topics  . Smoking status: Former Smoker    Packs/day: 0.50    Years: 15.00    Types: Cigarettes  . Smokeless tobacco: Never Used     Comment: "smoked in anesthesia school in the '70s;  then just a social smoker til I quit"  . Alcohol use No  . Drug use: No  . Sexual activity: Not Currently   Other Topics Concern  . 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 She is now widowed for just under 2 years. Her husband died of cancer. They do not have any children.   She recently (spring of 2015) moved to [place] to be closer to her brother, [name] and his family.   She been the caregiver for her ailing husband for 5 years. He passed away in 03-16-2012, and she has had prolonged period of grieving partly Wright to her having lost 3 members of her immediate family during that time period as well.  --- She is under a lot of stress      She is a former smoker quit years ago. She does not drink alcohol. She is active, but not routine exercise. She says that she tries to eat healthy and knows what usually affects her triglyceride levels.    Family History  Problem Relation Age of Onset  . Heart disease Brother     Died at 6  . Heart disease Mother     Died at 67  . Heart disease Father     Died at 67  . Breast cancer Sister     Died at 3    ROS- All systems are reviewed and negative except as per the HPI above  Physical Exam: Vitals:   03/31/16 1430  BP: 114/64  Pulse: 66  Weight: 149 lb 6.4 oz (67.8 kg)  Height: 5\' 3"  (1.6 m)    GEN- The patient is well appearing, alert and oriented x 3 today.   Head- normocephalic, atraumatic Eyes-  Sclera clear, conjunctiva pink Ears- hearing intact Oropharynx- clear Neck- supple, no JVP Lymph- no cervical lymphadenopathy Lungs- Clear to ausculation bilaterally, normal work of breathing Heart-regular rate and rhythm, no murmurs, rubs or gallops, PMI not laterally displaced GI- soft, NT, ND, + BS Extremities- no clubbing,  cyanosis, or edema MS- no significant deformity or atrophy Skin- no rash or lesion Psych- euthymic mood, full affect Neuro- strength  and sensation are intact  EKG-  SR at 66 bpm, pr int 182 ms, qrs int 132 ms, qtc 511 ms Epic records reviewed  ECHO- 12/2015-Left ventricle: The cavity size was normal. Wall thickness was  increased in a pattern of mild LVH. Systolic function was low  normal to mildly reduced. The estimated ejection fraction was in  the range of 50% to 55%. Septal-lateral dyssynchrony.  Indeterminant diastolic function (atrial fibrillation). - Aortic valve: There was no stenosis. - Mitral valve: There was trivial regurgitation. - Left atrium: The atrium was mildly dilated. - Right ventricle: The cavity size was normal. Systolic function  was normal. - Tricuspid valve: Peak RV-RA gradient (S): 25 mm Hg. - Pulmonary arteries: PA peak pressure: 28 mm Hg (S). - Inferior vena cava: The vessel was normal in size. The  respirophasic diameter changes were in the normal range (>= 50%),  consistent with normal central venous pressure.  Impressions:  - Normal LV size with mild LV hypertrophy. EF 50-55%,  septal-lateral dyssynchrony. Normal RV size and systolic  function. No significant valvular abnormalities. Labs, Trigs 456, Creat- 0.90, Potassium 4.0   Stress myoview-This is a low risk study.  Findings consistent with prior myocardial infarction.   Small mid-ventricular septal and anterior wall defect  No ischemia. May be related to LBBB Nol EF calculated Wright to rapid afib  30 day event monitor showed 55% afib burden  Assessment and Plan: 1. PAF S/p tikosyn load but still has intermittent afib Continue tikosyn 325 mg bid Has appointment 9/27 with Dr. Rayann Wright and wishes to discuss ablation    F/u afib clinic  As needed   Isabella Wright, [facility] [address] [place], [place] [address] [phone]

## 2016-04-13 ENCOUNTER — Encounter: Payer: Self-pay | Admitting: Internal Medicine

## 2016-04-13 ENCOUNTER — Other Ambulatory Visit: Payer: Self-pay

## 2016-04-13 ENCOUNTER — Ambulatory Visit (INDEPENDENT_AMBULATORY_CARE_PROVIDER_SITE_OTHER): Payer: Medicare Other | Admitting: Internal Medicine

## 2016-04-13 ENCOUNTER — Encounter (INDEPENDENT_AMBULATORY_CARE_PROVIDER_SITE_OTHER): Payer: Self-pay

## 2016-04-13 ENCOUNTER — Other Ambulatory Visit: Payer: Self-pay | Admitting: Cardiology

## 2016-04-13 VITALS — BP 136/70 | HR 64 | Ht 63.5 in | Wt 149.8 lb

## 2016-04-13 DIAGNOSIS — I471 Supraventricular tachycardia: Secondary | ICD-10-CM

## 2016-04-13 DIAGNOSIS — I481 Persistent atrial fibrillation: Secondary | ICD-10-CM

## 2016-04-13 DIAGNOSIS — I10 Essential (primary) hypertension: Secondary | ICD-10-CM | POA: Diagnosis not present

## 2016-04-13 DIAGNOSIS — I480 Paroxysmal atrial fibrillation: Secondary | ICD-10-CM | POA: Diagnosis not present

## 2016-04-13 DIAGNOSIS — I4819 Other persistent atrial fibrillation: Secondary | ICD-10-CM

## 2016-04-13 NOTE — Patient Instructions (Addendum)
Medication Instructions:  Your physician recommends that you continue on your current medications as directed. Please refer to the Current Medication list given to you today.   Labwork: Your physician recommends that you return for lab work on 05/02/16 BMP/CBC   Testing/Procedures:  Your physician has requested that you have cardiac CT. Cardiac computed tomography (CT) is a painless test that uses an x-ray machine to take clear, detailed pictures of your heart. For further information please visit [email or web address]. Please follow instruction sheet as given.--- week of 05/02/16, Office will call with time and instructions  Your physician has recommended that you have an ablation. Catheter ablation is a medical procedure used to treat some cardiac arrhythmias (irregular heartbeats). During catheter ablation, a long, thin, flexible tube is put into a blood vessel in your groin (upper thigh), or neck. This tube is called an ablation catheter. It is then guided to your heart through the blood vessel. Radio frequency waves destroy small areas of heart tissue where abnormal heartbeats may cause an arrhythmia to start. Please see the instruction sheet given to you today.---05/13/16  Please arrive at the [place] [facility] at 5:30am Do not eat or drink after midnight the night prior to your procedure Do not take any medications the morning of the procedure Plan for one night stay in the hospital         Follow-Up:   Your physician recommends that you schedule a follow-up appointment in: 4 weeks from 05/13/16 with Roderic Palau, NP and 3 months with Dr Rayann Heman

## 2016-04-18 ENCOUNTER — Other Ambulatory Visit: Payer: Self-pay | Admitting: Internal Medicine

## 2016-04-18 NOTE — Progress Notes (Signed)
Electrophysiology Office Note   Date:  04/18/2016   ID:  Isabella Wright, DOB 07/31/1945, MRN [id number]  PCP:  Velna Hatchet, MD  Cardiologist:  Dr Ellyn Hack Primary Electrophysiologist: Thompson Grayer, MD    CC: AFib   History of Present Illness: Isabella Wright is a 70 y.o. female who presents today for electrophysiology evaluation.   She reports having episodic tachyaplpitations for years which were attributed to SVT.  At times her palpitations were irregular.  Recently, she has developed increasing frequency and duration of irregular palpitations.  She presented to Dr Ellyn Hack 6/17 and was found to have afib.  In retrospect, she thinks that she has had afib for about 6 months prior to this.  She was started on xarelto and subsequently [name].  She has done better with tikosyn but continues to have episodes of afib.    Today, she denies symptoms of palpitations, chest pain, orthopnea, PND, lower extremity edema, claudication, dizziness, presyncope, syncope, bleeding, or neurologic sequela. The patient is tolerating medications without difficulties and is otherwise without complaint today.    Past Medical History:  Diagnosis Date  . 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 Diabetes mellitus type 2, controlled ([place]) 2012  . GERD (gastroesophageal reflux disease)   . History of hiatal hernia   . Hypertension 2000   2009: Renal Dopplers showed R RA ostial stenosis -- (told not a problem or 20 yrs)  . Hypertriglyceridemia 1996   Very labile and difficult control: Began in '96 when she began taking Premarin after hysterectomy -- triglycerides increased to 3582 neuropathy in her feet> care for by endocrinologis; levels vary based on stress.;; Labs from March 2015: TC 150, TG 745, HDL 26, LDL 45  . Kidney stones   . LBBB (left bundle branch block) 2009   Initially diagnosed as rate related during stress echo.  . Macular degeneration    "dry in right; treating like  wet in the left" (03/15/2016)  . New onset atrial fibrillation ([place]) 01/04/2016   This patients CHA2DS2-VASc Score and unadjusted Ischemic Stroke Rate (% per year) is equal to 4.8 % stroke rate/year from a score of 4 -- 1 point each: HTN, DM, Age if 70-74, or Female]     . Paroxysmal SVT (supraventricular tachycardia) ([place]) 2009   less frequent following I&D of chest wall abscess (Jan 015), not previously documented, may have been afib.  . Sarcoma ([place]) 2015   dermatofibro sarcoma protuberens left groin   Past Surgical History:  Procedure Laterality Date  . Abdominal Doppler ultrasound  February 2009    Right ostial renal artery stenosis; normal renal structure.  . APPENDECTOMY  1968  . CYSTOSCOPY W/ URETEROSCOPY W/ LITHOTRIPSY   1999   Large calcium oxalate stone  . CYSTOSCOPY/RETROGRADE/URETEROSCOPY/STONE EXTRACTION WITH BASKET  1972  . EXCISIONAL HEMORRHOIDECTOMY   1981  . FRACTURE SURGERY    . IRRIGATION AND DEBRIDEMENT SEBACEOUS CYST  January 2015    chest wall; with antibiotics --> following this treatment, SVT episodes became much less frequent.  . ORIF PROXIMAL TIBIAL PLATEAU FRACTURE Left 2007   , Related shattered left tibial plateau with reattachment of torn ligaments and insertion of titanium plate and screws.  Marland Kitchen RENAL ARTERY DUPLEX  01/02/2014   R&L RA - mildly elevated velocities - < 60%; Bilateral Kidneys - normal shape & size.  Marland Kitchen SARCOMA EXCISION Left 2015   dermatofibro sarcoma protuberens, wide excision of groin from front side to buttocks"  . SOFT  TISSUE BIOPSY Left 2015  . TOTAL ABDOMINAL HYSTERECTOMY  1994  . TRANSTHORACIC ECHOCARDIOGRAM  2013   normal  . TRANSTHORACIC ECHOCARDIOGRAM  March 2015   aortic sclerosis; without stenosis. Normal EF  . TREADMILL STRESS ECHO  01/2009   No regional wall motion abnormalities with stress == non-ischemic; rate related incomplete left bundle branch block     Current Outpatient Prescriptions  Medication Sig Dispense Refill    . ACCU-CHEK AVIVA PLUS test strip 1 EACH BY OTHER ROUTE 4 (FOUR) TIMES DAILY. USE AS INSTRUCTED DIAG E11.65  5  . cholecalciferol (VITAMIN D) 1000 UNITS tablet Take 1,000 Units by mouth daily.    Marland Kitchen diltiazem (CARDIZEM CD) 360 MG 24 hr capsule Take 1 capsule (360 mg total) by mouth daily. 90 capsule 3  . dofetilide (TIKOSYN) 125 MCG capsule Take 3 capsules (375 mcg total) by mouth 2 (two) times daily. 180 capsule 3  . Dulaglutide (TRULICITY) A999333 0000000 SOPN Inject 0.75 mg into the skin once a week. Thursday    . fenofibrate 160 MG tablet Take 160 mg by mouth daily.     Marland Kitchen glimepiride (AMARYL) 4 MG tablet Take 1 tablet by mouth daily.  5  . Insulin Aspart (NOVOLOG FLEXPEN [place]) Inject 10 Units into the skin.    . Insulin Detemir (LEVEMIR FLEXTOUCH) 100 UNIT/ML Pen Inject 50 Units into the skin at bedtime.     . metFORMIN (GLUCOPHAGE-XR) 500 MG 24 hr tablet 1000 MG TAKE IN THE EVENING    . metoprolol tartrate (LOPRESSOR) 25 MG tablet Take 1 tablet by mouth every 6 hours as needed for fast heart rates 180 tablet 3  . Multiple Vitamins-Minerals (PRESERVISION AREDS 2 PO) Take 1 tablet by mouth 2 (two) times daily.     Marland Kitchen omega-3 acid ethyl esters (LOVAZA) 1 G capsule Take 2 g by mouth 2 (two) times daily.     Marland Kitchen omeprazole (PRILOSEC OTC) 20 MG tablet Take 20 mg by mouth daily.    . potassium chloride SA (K-DUR,KLOR-CON) 20 MEQ tablet Take 1 tablet (20 mEq total) by mouth daily. 30 tablet 0  . ramipril (ALTACE) 10 MG capsule Take 10 mg by mouth daily.     . rivaroxaban (XARELTO) 20 MG TABS tablet Take 1 tablet (20 mg total) by mouth daily with supper. 30 tablet 11  . rosuvastatin (CRESTOR) 10 MG tablet TAKE 1 TABLET (10 MG TOTAL) BY MOUTH DAILY. 30 tablet 6   No current facility-administered medications for this visit.     Allergies:   Fluorescein; Exenatide; Liraglutide; and Demerol [meperidine]   Social History:  The patient  reports that she has quit smoking. Her smoking use included Cigarettes.  She has a 7.50 pack-year smoking history. She has never used smokeless tobacco. She reports that she does not drink alcohol or use drugs.   Family History:  The patient's  family history includes Breast cancer in her sister; Heart disease in her brother, father, and mother.    ROS:  Please see the history of present illness.   All other systems are reviewed and negative.    PHYSICAL EXAM: VS:  BP 136/70   Pulse 64   Ht 5' 3.5" (1.613 m)   Wt 149 lb 12.8 oz (67.9 kg)   BMI 26.12 kg/m  , BMI Body mass index is 26.12 kg/m. GEN: Well nourished, well developed, in no acute distress  HEENT: normal  Neck: no JVD, carotid bruits, or masses Cardiac: tachycardic irregular rhythm; no murmurs, rubs,  or gallops,no edema  Respiratory:  clear to auscultation bilaterally, normal work of breathing GI: soft, nontender, nondistended, + BS MS: no deformity or atrophy  Skin: warm and dry  Neuro:  Strength and sensation are intact Psych: euthymic mood, full affect  EKG:  EKG is ordered today. The ekg ordered today shows afib, V rates 119 bpm, LBBB   Lipid Panel     Component Value Date/Time   CHOL 149 12/30/2013 0807   TRIG 456 (H) 12/30/2013 0807   HDL 31 (L) 12/30/2013 0807   LDLCALC NOT CALC 12/30/2013 0807     Wt Readings from Last 3 Encounters:  04/13/16 149 lb 12.8 oz (67.9 kg)  03/31/16 149 lb 6.4 oz (67.8 kg)  03/25/16 149 lb 6.4 oz (67.8 kg)      Other studies Reviewed: Additional studies/ records that were reviewed today include: AF clinic records, recent Echo  Review of the above records today demonstrates: preserved EF, mild LA enlargement   ASSESSMENT AND PLAN:  1.  Persistent afib The patient has symptomatic atrial arrhythmias.  V rates are quite high at times.  chads2vasc score is at least 4.  She is chronically on xarelto. Though she has done better with tikosyn, she continues to have afib. Therapeutic strategies for afib including medicine and ablation were  discussed in detail with the patient today. Risk, benefits, and alternatives to EP study and radiofrequency ablation for afib were also discussed in detail today. These risks include but are not limited to stroke, bleeding, vascular damage, tamponade, perforation, damage to the esophagus, lungs, and other structures, pulmonary vein stenosis, worsening renal function, and death. The patient understands these risk and wishes to proceed.  We will therefore proceed with catheter ablation at the next available time.  Will plan cardiac CT prior to ablation.  2. HTN Stable No change required today  Current medicines are reviewed at length with the patient today.   The patient does not have concerns regarding her medicines.  The following changes were made today:  none   Signed, Thompson Grayer, MD    [place] [address] [phone] (office) [phone] (fax)

## 2016-04-19 ENCOUNTER — Other Ambulatory Visit: Payer: Self-pay

## 2016-04-19 ENCOUNTER — Other Ambulatory Visit: Payer: Self-pay | Admitting: Internal Medicine

## 2016-04-19 MED ORDER — POTASSIUM CHLORIDE CRYS ER 20 MEQ PO TBCR: 20.0000 meq | EXTENDED_RELEASE_TABLET | Freq: Every day | ORAL | 11 refills | Status: DC

## 2016-04-19 NOTE — Telephone Encounter (Signed)
potassium chloride SA (K-DUR,KLOR-CON) 20 MEQ tablet  Medication  Date: 04/19/2016 Department: [address] Office Ordering/Authorizing: Thompson Grayer, MD  Order Providers   Prescribing Provider Encounter Provider  Thompson Grayer, MD Stephannie Peters, CMA  Medication Detail    Disp Refills Start End   potassium chloride SA (K-DUR,KLOR-CON) 20 MEQ tablet 30 tablet 11 04/19/2016    Sig - Route: Take 1 tablet (20 mEq total) by mouth daily. - Oral   E-Prescribing Status: Receipt confirmed by pharmacy (04/19/2016 10:02 AM EDT)   Pharmacy   [id number] - [place], [place] - [address]

## 2016-04-26 ENCOUNTER — Telehealth: Payer: Self-pay | Admitting: Internal Medicine

## 2016-04-26 ENCOUNTER — Encounter: Payer: Self-pay | Admitting: Internal Medicine

## 2016-04-26 NOTE — Telephone Encounter (Signed)
Left a message for patient with date and times for labs and afib ablation.  Let her know Ivin Booty will be calling her to discuss cardiac CT as soon as she receives the pre-cert from insurance.

## 2016-04-26 NOTE — Telephone Encounter (Signed)
New message   Pt verbalized that she is calling for the instructions for the CathLab procedure

## 2016-05-02 ENCOUNTER — Other Ambulatory Visit: Payer: Medicare Other

## 2016-05-02 ENCOUNTER — Other Ambulatory Visit: Payer: Medicare Other | Admitting: *Deleted

## 2016-05-02 DIAGNOSIS — I480 Paroxysmal atrial fibrillation: Secondary | ICD-10-CM | POA: Diagnosis not present

## 2016-05-02 LAB — CBC WITH DIFFERENTIAL/PLATELET
BASOS PCT: 0 %
Basophils Absolute: 0 cells/uL (ref 0–200)
EOS PCT: 1 %
Eosinophils Absolute: 61 cells/uL (ref 15–500)
HEMATOCRIT: 42.5 % (ref 35.0–45.0)
Hemoglobin: 13.8 g/dL (ref 11.7–15.5)
LYMPHS PCT: 23 %
Lymphs Abs: 1403 cells/uL (ref 850–3900)
MCH: 26.6 pg — ABNORMAL LOW (ref 27.0–33.0)
MCHC: 32.5 g/dL (ref 32.0–36.0)
MCV: 82 fL (ref 80.0–100.0)
MONO ABS: 549 {cells}/uL (ref 200–950)
MPV: 9.3 fL (ref 7.5–12.5)
Monocytes Relative: 9 %
Neutro Abs: 4087 cells/uL (ref 1500–7800)
Neutrophils Relative %: 67 %
PLATELETS: 163 10*3/uL (ref 140–400)
RBC: 5.18 MIL/uL — AB (ref 3.80–5.10)
RDW: 14.6 % (ref 11.0–15.0)
WBC: 6.1 10*3/uL (ref 3.8–10.8)

## 2016-05-02 LAB — BASIC METABOLIC PANEL
BUN: 21 mg/dL (ref 7–25)
CHLORIDE: 103 mmol/L (ref 98–110)
CO2: 28 mmol/L (ref 20–31)
CREATININE: 0.87 mg/dL (ref 0.50–0.99)
Calcium: 10.4 mg/dL (ref 8.6–10.4)
Glucose, Bld: 122 mg/dL — ABNORMAL HIGH (ref 65–99)
Potassium: 4 mmol/L (ref 3.5–5.3)
Sodium: 141 mmol/L (ref 135–146)

## 2016-05-04 DIAGNOSIS — H353221 Exudative age-related macular degeneration, left eye, with active choroidal neovascularization: Secondary | ICD-10-CM | POA: Diagnosis not present

## 2016-05-04 DIAGNOSIS — H353114 Nonexudative age-related macular degeneration, right eye, advanced atrophic with subfoveal involvement: Secondary | ICD-10-CM | POA: Diagnosis not present

## 2016-05-06 DIAGNOSIS — Z23 Encounter for immunization: Secondary | ICD-10-CM | POA: Diagnosis not present

## 2016-05-09 ENCOUNTER — Encounter (HOSPITAL_COMMUNITY): Payer: Self-pay

## 2016-05-09 ENCOUNTER — Ambulatory Visit (HOSPITAL_COMMUNITY)
Admission: RE | Admit: 2016-05-09 | Discharge: 2016-05-09 | Disposition: A | Discharge status: Home / Self Care | Payer: Medicare Other | Source: Ambulatory Visit | Attending: Internal Medicine | Admitting: Internal Medicine

## 2016-05-09 DIAGNOSIS — K760 Fatty (change of) liver, not elsewhere classified: Secondary | ICD-10-CM | POA: Insufficient documentation

## 2016-05-09 DIAGNOSIS — I700 Atherosclerosis of aorta: Secondary | ICD-10-CM | POA: Diagnosis not present

## 2016-05-09 DIAGNOSIS — I480 Paroxysmal atrial fibrillation: Secondary | ICD-10-CM | POA: Diagnosis not present

## 2016-05-09 MED ORDER — METOPROLOL TARTRATE 5 MG/5ML IV SOLN
INTRAVENOUS | Status: AC
  Filled 2016-05-09: qty 10

## 2016-05-09 MED ORDER — METOPROLOL TARTRATE 5 MG/5ML IV SOLN
5.0000 mg | INTRAVENOUS | Status: DC | PRN
  Administered 2016-05-09 (×2): 5 mg via INTRAVENOUS
  Filled 2016-05-09 (×3): qty 5

## 2016-05-09 MED ORDER — IOPAMIDOL (ISOVUE-370) INJECTION 76%
INTRAVENOUS | Status: AC
  Administered 2016-05-09: 80 mL
  Filled 2016-05-09: qty 100

## 2016-05-09 MED ORDER — METOPROLOL TARTRATE 5 MG/5ML IV SOLN
INTRAVENOUS | Status: AC
  Filled 2016-05-09: qty 15

## 2016-05-12 NOTE — Anesthesia Preprocedure Evaluation (Addendum)
Anesthesia Evaluation  Patient identified by MRN, date of birth, ID band Patient awake    Reviewed: Allergy & Precautions, NPO status , Patient's Chart, lab work & pertinent test results  Airway Mallampati: II  TM Distance: >3 FB Neck ROM: Full    Dental no notable dental hx. (+) Dental Advisory Given, Teeth Intact   Pulmonary neg pulmonary ROS, former smoker,    Pulmonary exam normal breath sounds clear to auscultation       Cardiovascular hypertension, Pt. on medications and Pt. on home beta blockers + Peripheral Vascular Disease  Normal cardiovascular exam+ dysrhythmias Atrial Fibrillation  Rhythm:Regular Rate:Normal  Echo 12/2015 - Left ventricle: The cavity size was normal. Wall thickness was   increased in a pattern of mild LVH. Systolic function was low   normal to mildly reduced. The estimated ejection fraction was in   the range of 50% to 55%. Septal-lateral dyssynchrony.   Indeterminant diastolic function (atrial fibrillation). - Aortic valve: There was no stenosis. - Mitral valve: There was trivial regurgitation. - Left atrium: The atrium was mildly dilated. - Right ventricle: The cavity size was normal. Systolic function   was normal. - Tricuspid valve: Peak RV-RA gradient (S): 25 mm Hg. - Pulmonary arteries: PA peak pressure: 28 mm Hg (S). - Inferior vena cava: The vessel was normal in size. The   respirophasic diameter changes were in the normal range (>= 50%),  consistent with normal central venous pressure.  Impressions:  - Normal LV size with mild LV hypertrophy. EF 50-55%,   septal-lateral dyssynchrony. Normal RV size and systolic   function. No significant valvular abnormalities.   Neuro/Psych negative neurological ROS  negative psych ROS   GI/Hepatic Neg liver ROS, hiatal hernia, GERD  ,  Endo/Other  diabetes, Type 2, Oral Hypoglycemic Agents, Insulin Dependent  Renal/GU Renal disease  negative  genitourinary   Musculoskeletal negative musculoskeletal ROS (+)   Abdominal   Peds  Hematology negative hematology ROS (+)   Anesthesia Other Findings   Reproductive/Obstetrics negative OB ROS                           Anesthesia Physical Anesthesia Plan  ASA: III  Anesthesia Plan: MAC   Post-op Pain Management:    Induction: Intravenous  Airway Management Planned: Natural Airway and Simple Face Mask  Additional Equipment:   Intra-op Plan:   Post-operative Plan: Extubation in OR  Informed Consent: I have reviewed the patients History and Physical, chart, labs and discussed the procedure including the risks, benefits and alternatives for the proposed anesthesia with the patient or authorized representative who has indicated his/her understanding and acceptance.   Dental advisory given  Plan Discussed with: CRNA  Anesthesia Plan Comments:        Anesthesia Quick Evaluation

## 2016-05-13 ENCOUNTER — Ambulatory Visit (HOSPITAL_COMMUNITY): Payer: Medicare Other | Admitting: Certified Registered Nurse Anesthetist

## 2016-05-13 ENCOUNTER — Ambulatory Visit (HOSPITAL_COMMUNITY)
Admission: RE | Admit: 2016-05-13 | Discharge: 2016-05-14 | Disposition: A | Discharge status: Home / Self Care | Payer: Medicare Other | Source: Ambulatory Visit | Attending: Internal Medicine | Admitting: Internal Medicine

## 2016-05-13 ENCOUNTER — Encounter (HOSPITAL_COMMUNITY): Payer: Self-pay | Admitting: Certified Registered Nurse Anesthetist

## 2016-05-13 ENCOUNTER — Encounter (HOSPITAL_COMMUNITY)
Admission: RE | Disposition: A | Discharge status: Home / Self Care | Payer: Self-pay | Source: Ambulatory Visit | Attending: Internal Medicine

## 2016-05-13 DIAGNOSIS — I471 Supraventricular tachycardia, unspecified: Secondary | ICD-10-CM | POA: Diagnosis present

## 2016-05-13 DIAGNOSIS — Z8249 Family history of ischemic heart disease and other diseases of the circulatory system: Secondary | ICD-10-CM | POA: Diagnosis not present

## 2016-05-13 DIAGNOSIS — Z7901 Long term (current) use of anticoagulants: Secondary | ICD-10-CM | POA: Diagnosis not present

## 2016-05-13 DIAGNOSIS — Z803 Family history of malignant neoplasm of breast: Secondary | ICD-10-CM | POA: Diagnosis not present

## 2016-05-13 DIAGNOSIS — K219 Gastro-esophageal reflux disease without esophagitis: Secondary | ICD-10-CM | POA: Diagnosis not present

## 2016-05-13 DIAGNOSIS — I4891 Unspecified atrial fibrillation: Secondary | ICD-10-CM | POA: Diagnosis not present

## 2016-05-13 DIAGNOSIS — E781 Pure hyperglyceridemia: Secondary | ICD-10-CM | POA: Insufficient documentation

## 2016-05-13 DIAGNOSIS — I10 Essential (primary) hypertension: Secondary | ICD-10-CM | POA: Diagnosis not present

## 2016-05-13 DIAGNOSIS — I481 Persistent atrial fibrillation: Secondary | ICD-10-CM | POA: Diagnosis not present

## 2016-05-13 DIAGNOSIS — Z87891 Personal history of nicotine dependence: Secondary | ICD-10-CM | POA: Diagnosis not present

## 2016-05-13 DIAGNOSIS — Z8673 Personal history of transient ischemic attack (TIA), and cerebral infarction without residual deficits: Secondary | ICD-10-CM | POA: Diagnosis not present

## 2016-05-13 DIAGNOSIS — E1151 Type 2 diabetes mellitus with diabetic peripheral angiopathy without gangrene: Secondary | ICD-10-CM | POA: Insufficient documentation

## 2016-05-13 DIAGNOSIS — Z794 Long term (current) use of insulin: Secondary | ICD-10-CM | POA: Insufficient documentation

## 2016-05-13 DIAGNOSIS — I447 Left bundle-branch block, unspecified: Secondary | ICD-10-CM | POA: Insufficient documentation

## 2016-05-13 DIAGNOSIS — H3530 Unspecified macular degeneration: Secondary | ICD-10-CM | POA: Diagnosis not present

## 2016-05-13 DIAGNOSIS — I480 Paroxysmal atrial fibrillation: Secondary | ICD-10-CM | POA: Insufficient documentation

## 2016-05-13 HISTORY — PX: ELECTROPHYSIOLOGIC STUDY: SHX172A

## 2016-05-13 LAB — POCT ACTIVATED CLOTTING TIME
ACTIVATED CLOTTING TIME: 307 s
ACTIVATED CLOTTING TIME: 290 s
ACTIVATED CLOTTING TIME: 164 s
Activated Clotting Time: 318 seconds
Activated Clotting Time: 318 seconds

## 2016-05-13 LAB — GLUCOSE, CAPILLARY
GLUCOSE-CAPILLARY: 95 mg/dL (ref 65–99)
Glucose-Capillary: 117 mg/dL — ABNORMAL HIGH (ref 65–99)
Glucose-Capillary: 117 mg/dL — ABNORMAL HIGH (ref 65–99)
Glucose-Capillary: 175 mg/dL — ABNORMAL HIGH (ref 65–99)

## 2016-05-13 SURGERY — ATRIAL FIBRILLATION ABLATION
Anesthesia: Monitor Anesthesia Care

## 2016-05-13 MED ORDER — SODIUM CHLORIDE 0.9 % IV SOLN: 250.0000 mL | INTRAVENOUS | Status: DC | PRN

## 2016-05-13 MED ORDER — BUPIVACAINE HCL (PF) 0.25 % IJ SOLN
INTRAMUSCULAR | Status: DC | PRN
  Administered 2016-05-13: 20 mL

## 2016-05-13 MED ORDER — INSULIN ASPART 100 UNIT/ML FLEXPEN
10.0000 [IU] | PEN_INJECTOR | Freq: Three times a day (TID) | SUBCUTANEOUS | Status: DC
  Filled 2016-05-13: qty 3

## 2016-05-13 MED ORDER — RIVAROXABAN 20 MG PO TABS
20.0000 mg | ORAL_TABLET | Freq: Every day | ORAL | Status: DC
  Administered 2016-05-13: 20 mg via ORAL
  Filled 2016-05-13: qty 1

## 2016-05-13 MED ORDER — FENTANYL CITRATE (PF) 100 MCG/2ML IJ SOLN
INTRAMUSCULAR | Status: DC | PRN
  Administered 2016-05-13: 50 ug via INTRAVENOUS
  Administered 2016-05-13: 25 ug via INTRAVENOUS

## 2016-05-13 MED ORDER — SODIUM CHLORIDE 0.9% FLUSH: 3.0000 mL | INTRAVENOUS | Status: DC | PRN

## 2016-05-13 MED ORDER — ISOPROTERENOL HCL 0.2 MG/ML IJ SOLN
INTRAMUSCULAR | Status: AC
  Filled 2016-05-13: qty 5

## 2016-05-13 MED ORDER — SODIUM CHLORIDE 0.9 % IV SOLN
INTRAVENOUS | Status: DC
  Administered 2016-05-13: 07:00:00 via INTRAVENOUS

## 2016-05-13 MED ORDER — HEPARIN (PORCINE) IN NACL 2-0.9 UNIT/ML-% IJ SOLN
INTRAMUSCULAR | Status: DC | PRN
  Administered 2016-05-13: 11:00:00

## 2016-05-13 MED ORDER — INSULIN DETEMIR 100 UNIT/ML ~~LOC~~ SOLN
50.0000 [IU] | Freq: Every day | SUBCUTANEOUS | Status: DC
  Administered 2016-05-13: 50 [IU] via SUBCUTANEOUS
  Filled 2016-05-13 (×2): qty 0.5

## 2016-05-13 MED ORDER — ONDANSETRON HCL 4 MG/2ML IJ SOLN: 4.0000 mg | Freq: Four times a day (QID) | INTRAMUSCULAR | Status: DC | PRN

## 2016-05-13 MED ORDER — HYDROCODONE-ACETAMINOPHEN 5-325 MG PO TABS: 1.0000 | ORAL_TABLET | ORAL | Status: DC | PRN

## 2016-05-13 MED ORDER — IOPAMIDOL (ISOVUE-370) INJECTION 76%
INTRAVENOUS | Status: DC | PRN
  Administered 2016-05-13: 1 mL via INTRAVENOUS

## 2016-05-13 MED ORDER — PROPOFOL 500 MG/50ML IV EMUL
INTRAVENOUS | Status: DC | PRN
  Administered 2016-05-13: 75 ug/kg/min via INTRAVENOUS
  Administered 2016-05-13: 10:00:00 via INTRAVENOUS

## 2016-05-13 MED ORDER — HEPARIN SODIUM (PORCINE) 1000 UNIT/ML IJ SOLN
INTRAMUSCULAR | Status: DC | PRN
Start: 2016-05-13 — End: 2016-05-13
  Administered 2016-05-13: 3000 [IU] via INTRAVENOUS
  Administered 2016-05-13: 1000 [IU] via INTRAVENOUS

## 2016-05-13 MED ORDER — POTASSIUM CHLORIDE CRYS ER 20 MEQ PO TBCR
20.0000 meq | EXTENDED_RELEASE_TABLET | Freq: Every day | ORAL | Status: DC
Start: 2016-05-14 — End: 2016-05-14
  Administered 2016-05-14: 20 meq via ORAL
  Filled 2016-05-13: qty 1

## 2016-05-13 MED ORDER — RAMIPRIL 10 MG PO CAPS
10.0000 mg | ORAL_CAPSULE | Freq: Every day | ORAL | Status: DC
  Administered 2016-05-14: 10 mg via ORAL
  Filled 2016-05-13: qty 1

## 2016-05-13 MED ORDER — PROTAMINE SULFATE 10 MG/ML IV SOLN
INTRAVENOUS | Status: DC | PRN
  Administered 2016-05-13 (×3): 10 mg via INTRAVENOUS

## 2016-05-13 MED ORDER — SODIUM CHLORIDE 0.9 % IV SOLN
INTRAVENOUS | Status: DC | PRN
  Administered 2016-05-13: 07:00:00 via INTRAVENOUS

## 2016-05-13 MED ORDER — HEPARIN SODIUM (PORCINE) 1000 UNIT/ML IJ SOLN
INTRAMUSCULAR | Status: AC
Start: 2016-05-13 — End: 2016-05-13
  Filled 2016-05-13: qty 1

## 2016-05-13 MED ORDER — SODIUM CHLORIDE 0.9% FLUSH
3.0000 mL | Freq: Two times a day (BID) | INTRAVENOUS | Status: DC
  Administered 2016-05-13 (×2): 3 mL via INTRAVENOUS

## 2016-05-13 MED ORDER — ACETAMINOPHEN 325 MG PO TABS
650.0000 mg | ORAL_TABLET | ORAL | Status: DC | PRN
Start: 2016-05-13 — End: 2016-05-14

## 2016-05-13 MED ORDER — CLONAZEPAM 0.5 MG PO TABS
0.2500 mg | ORAL_TABLET | Freq: Every evening | ORAL | Status: DC | PRN
  Administered 2016-05-14: 0.25 mg via ORAL
  Filled 2016-05-13: qty 1

## 2016-05-13 MED ORDER — DOFETILIDE 250 MCG PO CAPS
375.0000 ug | ORAL_CAPSULE | Freq: Two times a day (BID) | ORAL | Status: DC
  Administered 2016-05-13 – 2016-05-14 (×2): 375 ug via ORAL
  Filled 2016-05-13 (×2): qty 1

## 2016-05-13 MED ORDER — IOPAMIDOL (ISOVUE-370) INJECTION 76%
INTRAVENOUS | Status: AC
  Filled 2016-05-13: qty 50

## 2016-05-13 MED ORDER — INSULIN ASPART 100 UNIT/ML ~~LOC~~ SOLN
10.0000 [IU] | Freq: Three times a day (TID) | SUBCUTANEOUS | Status: DC
Start: 2016-05-13 — End: 2016-05-14

## 2016-05-13 MED ORDER — ISOPROTERENOL HCL 0.2 MG/ML IJ SOLN
INTRAVENOUS | Status: DC | PRN
  Administered 2016-05-13: 20 ug/min via INTRAVENOUS

## 2016-05-13 MED ORDER — MIDAZOLAM HCL 5 MG/5ML IJ SOLN
INTRAMUSCULAR | Status: DC | PRN
  Administered 2016-05-13 (×2): 1 mg via INTRAVENOUS

## 2016-05-13 MED ORDER — GLIMEPIRIDE 4 MG PO TABS
4.0000 mg | ORAL_TABLET | Freq: Every day | ORAL | Status: DC
Start: 2016-05-14 — End: 2016-05-14
  Administered 2016-05-14: 4 mg via ORAL
  Filled 2016-05-13: qty 1

## 2016-05-13 MED ORDER — ONDANSETRON HCL 4 MG/2ML IJ SOLN
INTRAMUSCULAR | Status: DC | PRN
  Administered 2016-05-13: 4 mg via INTRAVENOUS

## 2016-05-13 MED ORDER — DILTIAZEM HCL ER COATED BEADS 360 MG PO CP24
360.0000 mg | ORAL_CAPSULE | Freq: Every day | ORAL | Status: DC
  Administered 2016-05-14: 360 mg via ORAL
  Filled 2016-05-13: qty 1

## 2016-05-13 MED ORDER — BUPIVACAINE HCL (PF) 0.25 % IJ SOLN
INTRAMUSCULAR | Status: AC
Start: 2016-05-13 — End: 2016-05-13
  Filled 2016-05-13: qty 30

## 2016-05-13 SURGICAL SUPPLY — 18 items
BAG SNAP BAND KOVER 36X36 (MISCELLANEOUS) ×3 IMPLANT
BLANKET WARM UNDERBOD FULL ACC (MISCELLANEOUS) ×3 IMPLANT
CATH NAVISTAR SMARTTOUCH DF (ABLATOR) ×3 IMPLANT
CATH SOUNDSTAR 3D IMAGING (CATHETERS) ×3 IMPLANT
CATH VARIABLE LASSO NAV 2515 (CATHETERS) ×3 IMPLANT
CATH WEBSTER BI DIR CS D-F CRV (CATHETERS) ×3 IMPLANT
COVER SWIFTLINK CONNECTOR (BAG) ×3 IMPLANT
NEEDLE TRANSEP BRK 71CM 407200 (NEEDLE) ×3 IMPLANT
PACK EP LATEX FREE (CUSTOM PROCEDURE TRAY) ×2
PACK EP LF (CUSTOM PROCEDURE TRAY) ×1 IMPLANT
PAD DEFIB LIFELINK (PAD) ×3 IMPLANT
PATCH CARTO3 (PAD) ×3 IMPLANT
SHEATH AVANTI 11F 11CM (SHEATH) ×3 IMPLANT
SHEATH PINNACLE 7F 10CM (SHEATH) ×6 IMPLANT
SHEATH PINNACLE 9F 10CM (SHEATH) ×3 IMPLANT
SHEATH SWARTZ TS SL2 63CM 8.5F (SHEATH) ×3 IMPLANT
SHIELD RADPAD SCOOP 12X17 (MISCELLANEOUS) ×3 IMPLANT
TUBING SMART ABLATE COOLFLOW (TUBING) ×3 IMPLANT

## 2016-05-13 NOTE — Transfer of Care (Signed)
Immediate Anesthesia Transfer of Care Note  Patient: Isabella Wright  Procedure(s) Performed: Procedure(s): Atrial Fibrillation Ablation (N/A)  Patient Location: Cath Lab  Anesthesia Type:MAC  Level of Consciousness: awake, alert  and oriented  Airway & Oxygen Therapy: Patient Spontanous Breathing and Patient connected to face mask oxygen  Post-op Assessment: Report given to RN, Post -op Vital signs reviewed and stable and Patient moving all extremities X 4  Post vital signs: Reviewed and stable  Last Vitals:  Vitals:   05/13/16 0559  BP: (!) 161/81  Pulse: 67  Resp: 18  Temp: 36.5 C    Last Pain:  Vitals:   05/13/16 0559  TempSrc: Oral      Patients Stated Pain Goal: 3 ([id number])  Complications: No apparent anesthesia complications

## 2016-05-13 NOTE — Anesthesia Procedure Notes (Signed)
Procedure Name: MAC Date/Time: 05/13/2016 7:50 AM Performed by: Garrison Columbus T Pre-anesthesia Checklist: Patient identified, Emergency Drugs available, Suction available and Patient being monitored Patient Re-evaluated:Patient Re-evaluated prior to inductionOxygen Delivery Method: Simple face mask Preoxygenation: Pre-oxygenation with 100% oxygen Intubation Type: IV induction Placement Confirmation: positive ETCO2 and breath sounds checked- equal and bilateral Dental Injury: Teeth and Oropharynx as per pre-operative assessment

## 2016-05-13 NOTE — Progress Notes (Signed)
Site area: Right groin  a 11, 9, 7, french venous sheaths were removed  Site Prior to Removal:  Level 0  Pressure Applied For 1 hour 49mins    Bedrest Beginning at 1315  Manual:   Yes.    Patient Status During Pull:  stable  Post Pull Groin Site:  Level 0  Post Pull Instructions Given:  Yes.    Post Pull Pulses Present:  Yes.    Dressing Applied:  Yes.    Comments:  VS remain stable during sheath pull

## 2016-05-13 NOTE — H&P (View-Only) (Signed)
Electrophysiology Office Note   Date:  04/18/2016   ID:  Isabella Wright, DOB [birth year], MRN [id number]  PCP:  Velna Hatchet, MD  Cardiologist:  Dr Ellyn Hack Primary Electrophysiologist: Thompson Grayer, MD    CC: AFib   History of Present Illness: Isabella Wright is a 70 y.o. female who presents today for electrophysiology evaluation.   She reports having episodic tachyaplpitations for years which were attributed to SVT.  At times her palpitations were irregular.  Recently, she has developed increasing frequency and duration of irregular palpitations.  She presented to Dr Ellyn Hack 6/17 and was found to have afib.  In retrospect, she thinks that she has had afib for about 6 months prior to this.  She was started on xarelto and subsequently [name].  She has done better with tikosyn but continues to have episodes of afib.    Today, she denies symptoms of palpitations, chest pain, orthopnea, PND, lower extremity edema, claudication, dizziness, presyncope, syncope, bleeding, or neurologic sequela. The patient is tolerating medications without difficulties and is otherwise without complaint today.    Past Medical History:  Diagnosis Date  . 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 Diabetes mellitus type 2, controlled ([place]) 2012  . GERD (gastroesophageal reflux disease)   . History of hiatal hernia   . Hypertension 2000   2009: Renal Dopplers showed R RA ostial stenosis -- (told not a problem or 20 yrs)  . Hypertriglyceridemia 1996   Very labile and difficult control: Began in '96 when she began taking Premarin after hysterectomy -- triglycerides increased to 3582 neuropathy in her feet> care for by endocrinologis; levels vary based on stress.;; Labs from March 2015: TC 150, TG 745, HDL 26, LDL 45  . Kidney stones   . LBBB (left bundle branch block) 2009   Initially diagnosed as rate related during stress echo.  . Macular degeneration    "dry in right; treating like  wet in the left" (03/15/2016)  . New onset atrial fibrillation ([place]) 01/04/2016   This patients CHA2DS2-VASc Score and unadjusted Ischemic Stroke Rate (% per year) is equal to 4.8 % stroke rate/year from a score of 4 -- 1 point each: HTN, DM, Age if 73-74, or Female]     . Paroxysmal SVT (supraventricular tachycardia) ([place]) 2009   less frequent following I&D of chest wall abscess (Jan 015), not previously documented, may have been afib.  . Sarcoma ([place]) 2015   dermatofibro sarcoma protuberens left groin   Past Surgical History:  Procedure Laterality Date  . Abdominal Doppler ultrasound  February 2009    Right ostial renal artery stenosis; normal renal structure.  . APPENDECTOMY  1968  . CYSTOSCOPY W/ URETEROSCOPY W/ LITHOTRIPSY   1999   Large calcium oxalate stone  . CYSTOSCOPY/RETROGRADE/URETEROSCOPY/STONE EXTRACTION WITH BASKET  1972  . EXCISIONAL HEMORRHOIDECTOMY   1981  . FRACTURE SURGERY    . IRRIGATION AND DEBRIDEMENT SEBACEOUS CYST  January 2015    chest wall; with antibiotics --> following this treatment, SVT episodes became much less frequent.  . ORIF PROXIMAL TIBIAL PLATEAU FRACTURE Left 2007   , Related shattered left tibial plateau with reattachment of torn ligaments and insertion of titanium plate and screws.  Marland Kitchen RENAL ARTERY DUPLEX  01/02/2014   R&L RA - mildly elevated velocities - < 60%; Bilateral Kidneys - normal shape & size.  Marland Kitchen SARCOMA EXCISION Left 2015   dermatofibro sarcoma protuberens, wide excision of groin from front side to buttocks"  . SOFT  TISSUE BIOPSY Left 2015  . TOTAL ABDOMINAL HYSTERECTOMY  1994  . TRANSTHORACIC ECHOCARDIOGRAM  2013   normal  . TRANSTHORACIC ECHOCARDIOGRAM  March 2015   aortic sclerosis; without stenosis. Normal EF  . TREADMILL STRESS ECHO  01/2009   No regional wall motion abnormalities with stress == non-ischemic; rate related incomplete left bundle branch block     Current Outpatient Prescriptions  Medication Sig Dispense Refill    . ACCU-CHEK AVIVA PLUS test strip 1 EACH BY OTHER ROUTE 4 (FOUR) TIMES DAILY. USE AS INSTRUCTED DIAG E11.65  5  . cholecalciferol (VITAMIN D) 1000 UNITS tablet Take 1,000 Units by mouth daily.    Marland Kitchen diltiazem (CARDIZEM CD) 360 MG 24 hr capsule Take 1 capsule (360 mg total) by mouth daily. 90 capsule 3  . dofetilide (TIKOSYN) 125 MCG capsule Take 3 capsules (375 mcg total) by mouth 2 (two) times daily. 180 capsule 3  . Dulaglutide (TRULICITY) [id number] 0000000 SOPN Inject 0.75 mg into the skin once a week. Thursday    . fenofibrate 160 MG tablet Take 160 mg by mouth daily.     Marland Kitchen glimepiride (AMARYL) 4 MG tablet Take 1 tablet by mouth daily.  5  . Insulin Aspart (NOVOLOG FLEXPEN [place]) Inject 10 Units into the skin.    . Insulin Detemir (LEVEMIR FLEXTOUCH) 100 UNIT/ML Pen Inject 50 Units into the skin at bedtime.     . metFORMIN (GLUCOPHAGE-XR) 500 MG 24 hr tablet 1000 MG TAKE IN THE EVENING    . metoprolol tartrate (LOPRESSOR) 25 MG tablet Take 1 tablet by mouth every 6 hours as needed for fast heart rates 180 tablet 3  . Multiple Vitamins-Minerals (PRESERVISION AREDS 2 PO) Take 1 tablet by mouth 2 (two) times daily.     Marland Kitchen omega-3 acid ethyl esters (LOVAZA) 1 G capsule Take 2 g by mouth 2 (two) times daily.     Marland Kitchen omeprazole (PRILOSEC OTC) 20 MG tablet Take 20 mg by mouth daily.    . potassium chloride SA (K-DUR,KLOR-CON) 20 MEQ tablet Take 1 tablet (20 mEq total) by mouth daily. 30 tablet 0  . ramipril (ALTACE) 10 MG capsule Take 10 mg by mouth daily.     . rivaroxaban (XARELTO) 20 MG TABS tablet Take 1 tablet (20 mg total) by mouth daily with supper. 30 tablet 11  . rosuvastatin (CRESTOR) 10 MG tablet TAKE 1 TABLET (10 MG TOTAL) BY MOUTH DAILY. 30 tablet 6   No current facility-administered medications for this visit.     Allergies:   Fluorescein; Exenatide; Liraglutide; and Demerol [meperidine]   Social History:  The patient  reports that she has quit smoking. Her smoking use included Cigarettes.  She has a 7.50 pack-year smoking history. She has never used smokeless tobacco. She reports that she does not drink alcohol or use drugs.   Family History:  The patient's  family history includes Breast cancer in her sister; Heart disease in her brother, father, and mother.    ROS:  Please see the history of present illness.   All other systems are reviewed and negative.    PHYSICAL EXAM: VS:  BP 136/70   Pulse 64   Ht 5' 3.5" (1.613 m)   Wt 149 lb 12.8 oz (67.9 kg)   BMI 26.12 kg/m  , BMI Body mass index is 26.12 kg/m. GEN: Well nourished, well developed, in no acute distress  HEENT: normal  Neck: no JVD, carotid bruits, or masses Cardiac: tachycardic irregular rhythm; no murmurs, rubs,  or gallops,no edema  Respiratory:  clear to auscultation bilaterally, normal work of breathing GI: soft, nontender, nondistended, + BS MS: no deformity or atrophy  Skin: warm and dry  Neuro:  Strength and sensation are intact Psych: euthymic mood, full affect  EKG:  EKG is ordered today. The ekg ordered today shows afib, V rates 119 bpm, LBBB   Lipid Panel     Component Value Date/Time   CHOL 149 12/30/2013 0807   TRIG 456 (H) 12/30/2013 0807   HDL 31 (L) 12/30/2013 0807   LDLCALC NOT CALC 12/30/2013 0807     Wt Readings from Last 3 Encounters:  04/13/16 149 lb 12.8 oz (67.9 kg)  03/31/16 149 lb 6.4 oz (67.8 kg)  03/25/16 149 lb 6.4 oz (67.8 kg)      Other studies Reviewed: Additional studies/ records that were reviewed today include: AF clinic records, recent Echo  Review of the above records today demonstrates: preserved EF, mild LA enlargement   ASSESSMENT AND PLAN:  1.  Persistent afib The patient has symptomatic atrial arrhythmias.  V rates are quite high at times.  chads2vasc score is at least 4.  She is chronically on xarelto. Though she has done better with tikosyn, she continues to have afib. Therapeutic strategies for afib including medicine and ablation were  discussed in detail with the patient today. Risk, benefits, and alternatives to EP study and radiofrequency ablation for afib were also discussed in detail today. These risks include but are not limited to stroke, bleeding, vascular damage, tamponade, perforation, damage to the esophagus, lungs, and other structures, pulmonary vein stenosis, worsening renal function, and death. The patient understands these risk and wishes to proceed.  We will therefore proceed with catheter ablation at the next available time.  Will plan cardiac CT prior to ablation.  2. HTN Stable No change required today  Current medicines are reviewed at length with the patient today.   The patient does not have concerns regarding her medicines.  The following changes were made today:  none   Signed, Thompson Grayer, MD    [place] [address] [phone] (office) [phone] (fax)

## 2016-05-13 NOTE — Interval H&P Note (Signed)
History and Physical Interval Note:  05/13/2016 7:23 AM  Isabella Wright  has presented today for surgery, with the diagnosis of afib  The various methods of treatment have been discussed with the patient and family. After consideration of risks, benefits and other options for treatment, the patient has consented to  Procedure(s): Atrial Fibrillation Ablation (N/A) as a surgical intervention .  She also has a h/o SVT.  Should she have ablatable pathway, she wishes to have this ablated also.  The patient's history has been reviewed, patient examined, no change in status, stable for surgery.  I have reviewed the patient's chart and labs.  Questions were answered to the patient's satisfaction.   Cardiac CT reviewed with patient and questions answered.   Thompson Grayer

## 2016-05-13 NOTE — Anesthesia Postprocedure Evaluation (Signed)
Anesthesia Post Note  Patient: Isabella Wright  Procedure(s) Performed: Procedure(s) (LRB): Atrial Fibrillation Ablation (N/A)  Patient location during evaluation: PACU Anesthesia Type: MAC Level of consciousness: awake and alert Pain management: pain level controlled Vital Signs Assessment: post-procedure vital signs reviewed and stable Respiratory status: spontaneous breathing Cardiovascular status: stable Anesthetic complications: no    Last Vitals:  Vitals:   05/13/16 1125 05/13/16 1130  BP: 140/69 128/74  Pulse: 72 71  Resp: 18 14  Temp:      Last Pain:  Vitals:   05/13/16 0559  TempSrc: Oral                 Nolon Nations

## 2016-05-13 NOTE — Discharge Summary (Signed)
ELECTROPHYSIOLOGY PROCEDURE DISCHARGE SUMMARY    Patient ID: Isabella Wright,  MRN: [id number], DOB/AGE: [birth year] 70 y.o.  Admit date: 05/13/2016 Discharge date: 05/14/16  Primary Care Physician: Velna Hatchet, MD Primary Cardiologist:  Electrophysiologist: Thompson Grayer, MD  Primary Discharge Diagnosis:  1. Persistent Afib s/p ablation this admission     CHA2DS2Vasc is at least 4, on Xarelto  Secondary Discharge Diagnosis:  1. HTN 2. DM   Procedures This Admission:  1.  Electrophysiology study and radiofrequency catheter ablation on 05/13/16 by Dr Thompson Grayer.  This study demonstrated  CONCLUSIONS: 1. Sinus rhythm upon presentation.   2. ICE reveals a moderate sized left atrium with four separate pulmonary veins without evidence of pulmonary vein stenosis. 3. Successful electrical isolation and anatomical encircling of all four pulmonary veins with radiofrequency current.  There was prodigious conduction within the PVs.  The RSPV was likely a culprit for afib. 4. No inducible arrhythmias following ablation both on and off of Isuprel. 5. No evidence of dual AV nodal physiology or accessory pathways. 6. No early apparent complications.  Brief HPI: Isabella Wright is a 70 y.o. female with a history of persistent atrial fibrillation.  They have failed medical therapy with Tikosyn. Risks, benefits, and alternatives to catheter ablation of atrial fibrillation were reviewed with the patient who wished to proceed.  The patient underwent cardiac CT prior to the procedure which demonstrated no LAA thrombus.    Hospital Course:  The patient was admitted and underwent EPS/RFCA of atrial fibrillation with details as outlined above.  They were monitored on telemetry overnight which demonstrated NSR.  Rt Groin was without complication on the day of discharge.  The patient was examined by Dr. Curt Bears and considered to be stable for discharge.  Wound care and restrictions were reviewed with the  patient.  Continue Tikosyn/diltiazem.   The patient Isabella Wright be seen back by Roderic Palau, NP in 4 weeks, Amber Seiler in 6 weeks for device optimization, and Dr Rayann Heman in 12 weeks for post ablation follow up.    Physical Exam: Vitals:   05/14/16 0100 05/14/16 0101 05/14/16 0804 05/14/16 0833  BP: 133/80 133/80 (!) 142/77 (!) 142/77  Pulse: 85 87  80  Resp: 18 19  19   Temp:  98.1 F (36.7 C)  98.6 F (37 C)  TempSrc:  Oral  Oral  SpO2: 96% 94%  93%  Weight:      Height:        GEN- The patient is well appearing, alert and oriented x 3 today.   HEENT: normocephalic, atraumatic; sclera clear, conjunctiva pink; hearing intact; oropharynx clear; neck supple  Lungs- Clear to ausculation bilaterally, normal work of breathing.  No wheezes, rales, rhonchi Heart- Regular rate and rhythm, no murmurs, rubs or gallops  GI- soft, non-tender, non-distended, bowel sounds present  Extremities- no clubbing, cyanosis, or edema; DP/PT/radial pulses 2+ bilaterally, groin without hematoma/bruit MS- no significant deformity or atrophy Skin- warm and dry, no rash or lesion Psych- euthymic mood, full affect Neuro- strength and sensation are intact   Labs:   Lab Results  Component Value Date   WBC 6.1 05/02/2016   HGB 13.8 05/02/2016   HCT 42.5 05/02/2016   MCV 82.0 05/02/2016   PLT 163 05/02/2016   No results for input(s): NA, K, CL, CO2, BUN, CREATININE, CALCIUM, PROT, BILITOT, ALKPHOS, ALT, AST, GLUCOSE in the last 168 hours.  Invalid input(s): LABALBU   Discharge Medications:    Medication List  TAKE these medications   ACCU-CHEK AVIVA PLUS test strip Generic drug:  glucose blood 1 EACH BY OTHER ROUTE 4 (FOUR) TIMES DAILY. USE AS INSTRUCTED DIAG E11.65   acetaminophen 325 MG tablet Commonly known as:  TYLENOL Take 2 tablets (650 mg total) by mouth every 4 (four) hours as needed for headache or mild pain.   cholecalciferol 1000 units tablet Commonly known as:  VITAMIN D Take 1,000  Units by mouth daily.   clonazePAM 0.5 MG tablet Commonly known as:  KLONOPIN Take 0.25-0.5 mg by mouth at bedtime as needed for anxiety (sleep).   diltiazem 360 MG 24 hr capsule Commonly known as:  CARDIZEM CD Take 1 capsule (360 mg total) by mouth daily.   dofetilide 125 MCG capsule Commonly known as:  TIKOSYN Take 3 capsules (375 mcg total) by mouth 2 (two) times daily.   fenofibrate 160 MG tablet Take 160 mg by mouth daily.   fluticasone 50 MCG/ACT nasal spray Commonly known as:  FLONASE Place 1-2 sprays into both nostrils at bedtime as needed for allergies or rhinitis.   glimepiride 4 MG tablet Commonly known as:  AMARYL Take 4 mg by mouth daily.   LEVEMIR FLEXTOUCH 100 UNIT/ML Pen Generic drug:  Insulin Detemir Inject 50 Units into the skin at bedtime.   metFORMIN 500 MG 24 hr tablet Commonly known as:  GLUCOPHAGE-XR Take 1 tablet (500 mg total) by mouth daily with breakfast. 1000 MG TAKE IN THE EVENING Start taking on:  05/16/2016 What changed:  how much to take  how to take this  when to take this   metoprolol tartrate 25 MG tablet Commonly known as:  LOPRESSOR Take 1 tablet by mouth every 6 hours as needed for fast heart rates   NOVOLOG FLEXPEN [place] Inject 10 Units into the skin 3 (three) times daily before meals. If blood sugar is higher than 120 give 10 units before each meal   omega-3 acid ethyl esters 1 g capsule Commonly known as:  LOVAZA Take 2 g by mouth 2 (two) times daily.   omeprazole 20 MG tablet Commonly known as:  PRILOSEC OTC Take 20 mg by mouth daily.   potassium chloride SA 20 MEQ tablet Commonly known as:  K-DUR,KLOR-CON Take 1 tablet (20 mEq total) by mouth daily.   PRESERVISION AREDS 2 PO Take 1 tablet by mouth 2 (two) times daily.   ramipril 10 MG capsule Commonly known as:  ALTACE Take 10 mg by mouth daily.   rivaroxaban 20 MG Tabs tablet Commonly known as:  XARELTO Take 1 tablet (20 mg total) by mouth daily with  supper.   rosuvastatin 10 MG tablet Commonly known as:  CRESTOR TAKE 1 TABLET (10 MG TOTAL) BY MOUTH DAILY IN THE EVENING What changed:  See the new instructions.   TRULICITY [id number] Sop[place] Generic drug:  Dulaglutide Inject 0.75 mg into the skin once a week. Thursday       Disposition:   Follow-up Information    [facility] Follow up on 06/13/2016.   Specialty:  Cardiology Why:  11:00AM Contact information: [address] [id number] [place] [phone]       Thompson Grayer, MD Follow up on 08/08/2016.   Specialty:  Cardiology Why:  11:45AM Contact information: [place] [address] [phone]           Duration of Discharge Encounter: Greater than 30 minutes including physician time.  Signed, Kerin Ransom  PA-C 05/14/2016 9:43 AM  I have seen and examined this patient with Kerin Ransom.  Agree with above, note added to reflect my findings.  On exam, regular rhythm, no murmurs, lungs clear. Had AF ablation with sinus rhythm overnight.  Plan for discharge today with no medication changes and follow up in AF clinic.    Tru Leopard M. Libbey Duce MD 05/14/2016 2:43 PM

## 2016-05-14 DIAGNOSIS — H3530 Unspecified macular degeneration: Secondary | ICD-10-CM | POA: Diagnosis not present

## 2016-05-14 DIAGNOSIS — I481 Persistent atrial fibrillation: Secondary | ICD-10-CM | POA: Diagnosis not present

## 2016-05-14 DIAGNOSIS — E781 Pure hyperglyceridemia: Secondary | ICD-10-CM | POA: Diagnosis not present

## 2016-05-14 DIAGNOSIS — I10 Essential (primary) hypertension: Secondary | ICD-10-CM | POA: Diagnosis not present

## 2016-05-14 DIAGNOSIS — I447 Left bundle-branch block, unspecified: Secondary | ICD-10-CM | POA: Diagnosis not present

## 2016-05-14 DIAGNOSIS — E1151 Type 2 diabetes mellitus with diabetic peripheral angiopathy without gangrene: Secondary | ICD-10-CM | POA: Diagnosis not present

## 2016-05-14 DIAGNOSIS — I480 Paroxysmal atrial fibrillation: Secondary | ICD-10-CM | POA: Diagnosis not present

## 2016-05-14 DIAGNOSIS — K219 Gastro-esophageal reflux disease without esophagitis: Secondary | ICD-10-CM | POA: Diagnosis not present

## 2016-05-14 LAB — GLUCOSE, CAPILLARY: Glucose-Capillary: 96 mg/dL (ref 65–99)

## 2016-05-14 MED ORDER — ROSUVASTATIN CALCIUM 10 MG PO TABS: ORAL_TABLET | ORAL | 6 refills | Status: DC

## 2016-05-14 MED ORDER — HYDROCORTISONE 1 % EX CREA
TOPICAL_CREAM | Freq: Three times a day (TID) | CUTANEOUS | Status: DC | PRN
Start: 2016-05-14 — End: 2016-05-14
  Filled 2016-05-14: qty 28

## 2016-05-14 MED ORDER — ACETAMINOPHEN 325 MG PO TABS: 650.0000 mg | ORAL_TABLET | ORAL | Status: AC | PRN

## 2016-05-14 MED ORDER — METFORMIN HCL ER 500 MG PO TB24: 500.0000 mg | ORAL_TABLET | Freq: Every day | ORAL | Status: AC

## 2016-05-31 DIAGNOSIS — F418 Other specified anxiety disorders: Secondary | ICD-10-CM | POA: Diagnosis not present

## 2016-05-31 DIAGNOSIS — I480 Paroxysmal atrial fibrillation: Secondary | ICD-10-CM | POA: Diagnosis not present

## 2016-05-31 DIAGNOSIS — C44799 Other specified malignant neoplasm of skin of left lower limb, including hip: Secondary | ICD-10-CM | POA: Diagnosis not present

## 2016-05-31 DIAGNOSIS — I10 Essential (primary) hypertension: Secondary | ICD-10-CM | POA: Diagnosis not present

## 2016-05-31 DIAGNOSIS — E1137X3 Type 2 diabetes mellitus with diabetic macular edema, resolved following treatment, bilateral: Secondary | ICD-10-CM | POA: Diagnosis not present

## 2016-05-31 DIAGNOSIS — I471 Supraventricular tachycardia: Secondary | ICD-10-CM | POA: Diagnosis not present

## 2016-06-02 DIAGNOSIS — Z794 Long term (current) use of insulin: Secondary | ICD-10-CM | POA: Diagnosis not present

## 2016-06-02 DIAGNOSIS — E1165 Type 2 diabetes mellitus with hyperglycemia: Secondary | ICD-10-CM | POA: Diagnosis not present

## 2016-06-13 ENCOUNTER — Ambulatory Visit (HOSPITAL_COMMUNITY)
Admission: RE | Admit: 2016-06-13 | Discharge: 2016-06-13 | Disposition: A | Discharge status: Home / Self Care | Payer: Medicare Other | Source: Ambulatory Visit | Attending: Nurse Practitioner | Admitting: Nurse Practitioner

## 2016-06-13 VITALS — BP 138/74 | HR 63 | Ht 60.0 in | Wt 149.4 lb

## 2016-06-13 DIAGNOSIS — Z794 Long term (current) use of insulin: Secondary | ICD-10-CM | POA: Diagnosis not present

## 2016-06-13 DIAGNOSIS — E119 Type 2 diabetes mellitus without complications: Secondary | ICD-10-CM | POA: Diagnosis not present

## 2016-06-13 DIAGNOSIS — Z7901 Long term (current) use of anticoagulants: Secondary | ICD-10-CM | POA: Insufficient documentation

## 2016-06-13 DIAGNOSIS — I480 Paroxysmal atrial fibrillation: Secondary | ICD-10-CM

## 2016-06-13 DIAGNOSIS — Z79899 Other long term (current) drug therapy: Secondary | ICD-10-CM | POA: Insufficient documentation

## 2016-06-13 DIAGNOSIS — Z87891 Personal history of nicotine dependence: Secondary | ICD-10-CM | POA: Insufficient documentation

## 2016-06-13 DIAGNOSIS — I10 Essential (primary) hypertension: Secondary | ICD-10-CM | POA: Insufficient documentation

## 2016-06-13 DIAGNOSIS — E781 Pure hyperglyceridemia: Secondary | ICD-10-CM | POA: Diagnosis not present

## 2016-06-13 DIAGNOSIS — I4891 Unspecified atrial fibrillation: Secondary | ICD-10-CM | POA: Diagnosis present

## 2016-06-13 DIAGNOSIS — I447 Left bundle-branch block, unspecified: Secondary | ICD-10-CM | POA: Diagnosis not present

## 2016-06-13 NOTE — Progress Notes (Signed)
Patient ID: Isabella Wright, female   DOB: [birth year], 70 y.o.   MRN: [id number]     Primary Care Physician: Isabella Hatchet, MD Referring Physician: Dr. Jena Gauss Isabella Wright is a 70 y.o. female with a h/o DM, HTN, hypertriglyceridemia, LBBB,previuosly worked up in [place], [place], episodes of tachycardia for many years, but dx as afib 12/2015. She is currently wearing an event monitor. She has had strips showing afib with rvr up to 150-170 bpm. Longest episode reported in EPIC lastest 30 mins. Pt states she was unaware of having afib when the monitor company called to check on her. She is on xarelto with  a chadsvasc score of at least 4. 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 He had previously been on amiodarone, which made him very ill, and tikosyn which controlled the afib and he tolerated well, until it was stopped due to long QT.  She denies tobacco abuse, alcohol use, minimal caffeine. Snores some but does not think to a significant degree. Exercises on a regular basis.Has been on Inderal for years, uses more prn than daily. Is also on diltiazem. Metoprolol daily added.  Returns to afib clinic, 8/4. Event monitor finished and showed 55% afib burden. She is in afib today with rvr. Some days she feels well and other days, she feels short of breath with activities. She enjoys golfing but has refrained this summer for not feeling like she has the energy to play. Recent echo showed normal EF with mildly dilated  left atrium. She had a stress test which was low risk.  F/u 9/8, after consult with Dr. Rayann Wright, she was admitted for tikosyn. She was  loaded on tikosyn and left the hospital on 375 mg bid in SR. Unfortunately, she is in afib today in the afib clininc. She felt really good for the first few days but for the last two days has felt  very poorly with fatigue.  Returns 9/14, she is in [place], but feels that she was in afib most of the am. Has had good days and bad days since [name].  She feels that [name] is not quite doing the job of keeping in [name], and ablation is discussed, which she is interested in and will discuss further with Dr. Rayann Wright 9/27.  Returns to afib clinic 11/27, one month after ablation and is elated that she feels so well. She did have some afib right after the procedure but has not had any for weeks. She has so much energy. No swallowing or groin issues. Continues on tikosyn and xarelto.  Today, she denies symptoms of palpitations, chest pain, orthopnea, PND, lower extremity edema, dizziness, presyncope, syncope, or neurologic sequela. Positive for fatigue and shortness of breath at times. The patient is tolerating medications without difficulties and is otherwise without complaint today.   Past Medical History:  Diagnosis Date  . 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 Diabetes mellitus type 2, controlled ([place]) 11/15/10  . GERD (gastroesophageal reflux disease)   . History of hiatal hernia   . Hypertension 2000   2009: Renal Dopplers showed R RA ostial stenosis -- (told not a problem or 20 yrs)  . Hypertriglyceridemia 1996   Very labile and difficult control: Began in Nov 15, 1994 when she began taking Premarin after hysterectomy --  triglycerides increased to 3582 neuropathy in her feet> care for by endocrinologis; levels vary based on stress.;; Labs from March 2015: TC 150, TG 745, HDL 26, LDL 45  . Kidney stones   . LBBB (left bundle branch block) 2009   Initially diagnosed as rate related during stress echo.  . Macular degeneration    "dry in right; treating like wet in the left" (03/15/2016)  . New onset atrial fibrillation ([facility]) 01/04/2016   This patients CHA2DS2-VASc Score and unadjusted Ischemic Stroke Rate (% per year) is equal to 4.8 % stroke rate/year from a score of 4 -- 1  point each: HTN, DM, Age if 1-74, or Female]     . Paroxysmal SVT (supraventricular tachycardia) ([place]) 2009   less frequent following I&D of chest wall abscess (Jan 015), not previously documented, may have been afib.  . Sarcoma ([facility]) 2015   dermatofibro sarcoma protuberens left groin   Past Surgical History:  Procedure Laterality Date  . Abdominal Doppler ultrasound  February 2009    Right ostial renal artery stenosis; normal renal structure.  . APPENDECTOMY  1968  . CYSTOSCOPY W/ URETEROSCOPY W/ LITHOTRIPSY   1999   Large calcium oxalate stone  . CYSTOSCOPY/RETROGRADE/URETEROSCOPY/STONE EXTRACTION WITH BASKET  1972  . ELECTROPHYSIOLOGIC STUDY N/A 05/13/2016   Procedure: Atrial Fibrillation Ablation;  Surgeon: Isabella Grayer, MD;  Location: [facility];  Service: Cardiovascular;  Laterality: N/A;  . EXCISIONAL HEMORRHOIDECTOMY   1981  . FRACTURE SURGERY    . IRRIGATION AND DEBRIDEMENT SEBACEOUS CYST  January 2015    chest wall; with antibiotics --> following this treatment, SVT episodes became much less frequent.  . ORIF PROXIMAL TIBIAL PLATEAU FRACTURE Left 2007   , Related shattered left tibial plateau with reattachment of torn ligaments and insertion of titanium plate and screws.  Marland Kitchen RENAL ARTERY DUPLEX  01/02/2014   R&L RA - mildly elevated velocities - < 60%; Bilateral Kidneys - normal shape & size.  Marland Kitchen SARCOMA EXCISION Left 2015   dermatofibro sarcoma protuberens, wide excision of groin from front side to buttocks"  . SOFT TISSUE BIOPSY Left 2015  . TOTAL ABDOMINAL HYSTERECTOMY  1994  . TRANSTHORACIC ECHOCARDIOGRAM  2013   normal  . TRANSTHORACIC ECHOCARDIOGRAM  March 2015   aortic sclerosis; without stenosis. Normal EF  . TREADMILL STRESS ECHO  01/2009   No regional wall motion abnormalities with stress == non-ischemic; rate related incomplete left bundle branch block    Current Outpatient Prescriptions  Medication Sig Dispense Refill  . ACCU-CHEK AVIVA PLUS test strip  1 EACH BY OTHER ROUTE 4 (FOUR) TIMES DAILY. USE AS INSTRUCTED DIAG E11.65  5  . acetaminophen (TYLENOL) 325 MG tablet Take 2 tablets (650 mg total) by mouth every 4 (four) hours as needed for headache or mild pain.    . cholecalciferol (VITAMIN D) 1000 UNITS tablet Take 1,000 Units by mouth daily.    . clonazePAM (KLONOPIN) 0.5 MG tablet Take 0.25-0.5 mg by mouth at bedtime as needed for anxiety (sleep).   0  . diltiazem (CARDIZEM CD) 360 MG 24 hr capsule Take 1 capsule (360 mg total) by mouth daily. 90 capsule 3  . dofetilide (TIKOSYN) 125 MCG capsule Take 3 capsules (375 mcg total) by mouth 2 (two) times daily. 180 capsule 3  . Dulaglutide (TRULICITY) A999333 0000000 SOPN Inject 0.75 mg into the skin once a week. Thursday    . fenofibrate 160 MG tablet Take 160 mg by mouth daily.     Marland Kitchen  fluticasone (FLONASE) 50 MCG/ACT nasal spray Place 1-2 sprays into both nostrils at bedtime as needed for allergies or rhinitis.    Marland Kitchen glimepiride (AMARYL) 4 MG tablet Take 4 mg by mouth daily.   5  . Insulin Aspart (NOVOLOG FLEXPEN [place]) Inject 10 Units into the skin 3 (three) times daily before meals. If blood sugar is higher than 120 give 10 units before each meal    . Insulin Detemir (LEVEMIR FLEXTOUCH) 100 UNIT/ML Pen Inject 50 Units into the skin at bedtime.     . metFORMIN (GLUCOPHAGE-XR) 500 MG 24 hr tablet Take 1 tablet (500 mg total) by mouth daily with breakfast. 1000 MG TAKE IN THE EVENING    . metoprolol tartrate (LOPRESSOR) 25 MG tablet Take 1 tablet by mouth every 6 hours as needed for fast heart rates 180 tablet 3  . Multiple Vitamins-Minerals (PRESERVISION AREDS 2 PO) Take 1 tablet by mouth 2 (two) times daily.     Marland Kitchen omega-3 acid ethyl esters (LOVAZA) 1 G capsule Take 2 g by mouth 2 (two) times daily.     Marland Kitchen omeprazole (PRILOSEC OTC) 20 MG tablet Take 20 mg by mouth daily.    . potassium chloride SA (K-DUR,KLOR-CON) 20 MEQ tablet Take 1 tablet (20 mEq total) by mouth daily. 30 tablet 11  . ramipril  (ALTACE) 10 MG capsule Take 10 mg by mouth daily.     . rivaroxaban (XARELTO) 20 MG TABS tablet Take 1 tablet (20 mg total) by mouth daily with supper. 30 tablet 11  . rosuvastatin (CRESTOR) 10 MG tablet TAKE 1 TABLET (10 MG TOTAL) BY MOUTH DAILY IN THE EVENING 30 tablet 6   No current facility-administered medications for this encounter.     Allergies  Allergen Reactions  . Fluorescein Anaphylaxis    Pt states she had swelling of facial tissue with SOB and itching with a rapid heart rate.  . Exenatide Nausea And Vomiting  . Liraglutide Nausea And Vomiting  . Demerol [Meperidine] Nausea And Vomiting    Social History   Social History  . Marital status: Widowed    Spouse name: N/A  . Number of children: N/A  . Years of education: N/A   Occupational History  . Not on file.   Social History Main Topics  . Smoking status: Former Smoker    Packs/day: 0.50    Years: 15.00    Types: Cigarettes  . 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 Alcohol use No  . Drug use: No  . Sexual activity: Not Currently   Other Topics Concern  . 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 She is now widowed for just under 2 years. Her husband died of cancer. They do not have any children.   She recently (spring of 2015) moved to [place] to be closer to her brother, [name] and his family.   She been the caregiver for her ailing husband for 5 years. He passed away in 03-31-12, and she has had prolonged period of grieving partly due to her having lost 3 members of her immediate family during that time period as well.  --- She is under a lot of stress      She is a former smoker quit years ago. She does not drink alcohol. She is active, but  not routine exercise. She says that she tries to eat healthy and knows what usually affects her triglyceride  levels.    Family History  Problem Relation Age of Onset  . Heart disease Brother     Died at 46  . Heart disease Mother     Died at 13  . Heart disease Father     Died at 32  . Breast cancer Sister     Died at 31    ROS- All systems are reviewed and negative except as per the HPI above  Physical Exam: There were no vitals filed for this visit.  GEN- The patient is well appearing, alert and oriented x 3 today.   Head- normocephalic, atraumatic Eyes-  Sclera clear, conjunctiva pink Ears- hearing intact Oropharynx- clear Neck- supple, no JVP Lymph- no cervical lymphadenopathy Lungs- Clear to ausculation bilaterally, normal work of breathing Heart-regular rate and rhythm, no murmurs, rubs or gallops, PMI not laterally displaced GI- soft, NT, ND, + BS Extremities- no clubbing, cyanosis, or edema MS- no significant deformity or atrophy Skin- no rash or lesion Psych- euthymic mood, full affect Neuro- strength and sensation are intact  EKG-  SR at 63 bpm, pr int 178 ms, qrs int 138 ms, qtc 505 ms(stable for pt) Epic records reviewed  ECHO- 12/2015-Left ventricle: The cavity size was normal. Wall thickness was  increased in a pattern of mild LVH. Systolic function was low  normal to mildly reduced. The estimated ejection fraction was in  the range of 50% to 55%. Septal-lateral dyssynchrony.  Indeterminant diastolic function (atrial fibrillation). - Aortic valve: There was no stenosis. - Mitral valve: There was trivial regurgitation. - Left atrium: The atrium was mildly dilated. - Right ventricle: The cavity size was normal. Systolic function  was normal. - Tricuspid valve: Peak RV-RA gradient (S): 25 mm Hg. - Pulmonary arteries: PA peak pressure: 28 mm Hg (S). - Inferior vena cava: The vessel was normal in size. The  respirophasic diameter changes were in the normal range (>= 50%),  consistent with normal central venous pressure.  Impressions:  - Normal LV  size with mild LV hypertrophy. EF 50-55%,  septal-lateral dyssynchrony. Normal RV size and systolic  function. No significant valvular abnormalities. Labs, Trigs 456, Creat- 0.90, Potassium 4.0   Stress myoview-This is a low risk study.  Findings consistent with prior myocardial infarction.   Small mid-ventricular septal and anterior wall defect  No ischemia. May be related to LBBB Nol EF calculated due to rapid afib  30 day event monitor showed 55% afib burden LABS- 10/16- bmet, k+ at 4.0, creat 0.87,  9/8 mag 1.9   Assessment and Plan: 1. PAF S/p ablation and staying in SR S/p tikosyn load   Mag/kt recently checked Continue tikosyn 325 mg bid Continue xaretlo  F/u afib clinic as needed Dr. Rayann Wright as scheduled 1/22   Geroge Baseman. [place], [facility] [address] [place], [place] [address] [phone]

## 2016-07-18 ENCOUNTER — Other Ambulatory Visit: Payer: Self-pay | Admitting: Physician Assistant

## 2016-07-19 DIAGNOSIS — H353114 Nonexudative age-related macular degeneration, right eye, advanced atrophic with subfoveal involvement: Secondary | ICD-10-CM | POA: Diagnosis not present

## 2016-07-19 DIAGNOSIS — H353221 Exudative age-related macular degeneration, left eye, with active choroidal neovascularization: Secondary | ICD-10-CM | POA: Diagnosis not present

## 2016-07-20 ENCOUNTER — Other Ambulatory Visit: Payer: Self-pay | Admitting: *Deleted

## 2016-07-20 MED ORDER — DOFETILIDE 125 MCG PO CAPS: 375.0000 ug | ORAL_CAPSULE | Freq: Two times a day (BID) | ORAL | 7 refills | Status: DC

## 2016-07-20 NOTE — Telephone Encounter (Signed)
Patient called and stated that she had requested a refill on her tikosyn and per the pharmacy our office had denied the request. It was denied as below Refused    Disp Refills Start End  dofetilide (TIKOSYN) 125 MCG capsule [Pharmacy Med Name: DOFETILIDE 125 MCG CAPSULE] 180 capsule 3 07/19/2016   Sig:  TAKE 3 CAPSULES BY MOUTH TWICE A DAY  Class:  Normal  DAW:  No  Reason for Refusal:  Patient has requested refill too soon  Refused By:  Stephannie Peters, CMA  Visit Pharmacy   [id number] - [place], [place] - [address]   Patient takes a total of six capsules daily so a quantity of 180 only lasts one month. She has enough to last until Saturday but she was worried as she is aware that she is not to miss a dose of this medication. I apologized that this had happened and informed her that I would send this in for her today.

## 2016-07-21 DIAGNOSIS — E1100 Type 2 diabetes mellitus with hyperosmolarity without nonketotic hyperglycemic-hyperosmolar coma (NKHHC): Secondary | ICD-10-CM | POA: Diagnosis not present

## 2016-07-21 DIAGNOSIS — Z08 Encounter for follow-up examination after completed treatment for malignant neoplasm: Secondary | ICD-10-CM | POA: Diagnosis not present

## 2016-07-21 DIAGNOSIS — I4891 Unspecified atrial fibrillation: Secondary | ICD-10-CM | POA: Diagnosis not present

## 2016-07-21 DIAGNOSIS — I10 Essential (primary) hypertension: Secondary | ICD-10-CM | POA: Diagnosis not present

## 2016-07-21 DIAGNOSIS — I471 Supraventricular tachycardia: Secondary | ICD-10-CM | POA: Diagnosis not present

## 2016-07-21 DIAGNOSIS — C44799 Other specified malignant neoplasm of skin of left lower limb, including hip: Secondary | ICD-10-CM | POA: Diagnosis not present

## 2016-07-21 DIAGNOSIS — I701 Atherosclerosis of renal artery: Secondary | ICD-10-CM | POA: Diagnosis not present

## 2016-07-21 DIAGNOSIS — E119 Type 2 diabetes mellitus without complications: Secondary | ICD-10-CM | POA: Diagnosis not present

## 2016-07-21 DIAGNOSIS — Z85830 Personal history of malignant neoplasm of bone: Secondary | ICD-10-CM | POA: Diagnosis not present

## 2016-07-21 DIAGNOSIS — Z7901 Long term (current) use of anticoagulants: Secondary | ICD-10-CM | POA: Diagnosis not present

## 2016-07-21 DIAGNOSIS — Z9889 Other specified postprocedural states: Secondary | ICD-10-CM | POA: Diagnosis not present

## 2016-08-08 ENCOUNTER — Other Ambulatory Visit: Payer: Self-pay | Admitting: *Deleted

## 2016-08-08 ENCOUNTER — Encounter: Payer: Self-pay | Admitting: Internal Medicine

## 2016-08-08 ENCOUNTER — Ambulatory Visit (INDEPENDENT_AMBULATORY_CARE_PROVIDER_SITE_OTHER): Payer: Medicare Other | Admitting: Internal Medicine

## 2016-08-08 VITALS — BP 144/80 | HR 68 | Ht 60.0 in | Wt 148.0 lb

## 2016-08-08 DIAGNOSIS — I480 Paroxysmal atrial fibrillation: Secondary | ICD-10-CM | POA: Diagnosis not present

## 2016-08-08 DIAGNOSIS — I10 Essential (primary) hypertension: Secondary | ICD-10-CM

## 2016-08-08 NOTE — Progress Notes (Signed)
Electrophysiology Office Note   Date:  08/08/2016   ID:  Isabella Wright, DOB Jan 05, 1946, MRN [id number]  PCP:  Velna Hatchet, MD  Cardiologist:  Dr Ellyn Hack Primary Electrophysiologist: Thompson Grayer, MD    CC: AFib   History of Present Illness: Isabella Wright is a 71 y.o. female who presents today for electrophysiology evaluation.  She is doing great post ablation.  Maintaining sinus rhythm.  Denies procedure related complications.  Today, she denies symptoms of palpitations, chest pain, orthopnea, PND, lower extremity edema, claudication, dizziness, presyncope, syncope, bleeding, or neurologic sequela. The patient is tolerating medications without difficulties and is otherwise without complaint today.    Past Medical History:  Diagnosis Date  . 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 Diabetes mellitus type 2, controlled ([place]) 2012  . GERD (gastroesophageal reflux disease)   . History of hiatal hernia   . Hypertension 2000   2009: Renal Dopplers showed R RA ostial stenosis -- (told not a problem or 20 yrs)  . Hypertriglyceridemia 1996   Very labile and difficult control: Began in '96 when she began taking Premarin after hysterectomy -- triglycerides increased to 3582 neuropathy in her feet> care for by endocrinologis; levels vary based on stress.;; Labs from March 2015: TC 150, TG 745, HDL 26, LDL 45  . Kidney stones   . LBBB (left bundle branch block) 2009   Initially diagnosed as rate related during stress echo.  . Macular degeneration    "dry in right; treating like wet in the left" (03/15/2016)  . New onset atrial fibrillation ([facility]) 01/04/2016   This patients CHA2DS2-VASc Score and unadjusted Ischemic Stroke Rate (% per year) is equal to 4.8 % stroke rate/year from a score of 4 -- 1 point each: HTN, DM, Age if 47-74, or Female]     . Paroxysmal SVT (supraventricular tachycardia) ([facility]) 2009   less frequent following I&D of chest wall abscess (Jan  015), not previously documented, may have been afib.  . Sarcoma ([facility]) 2015   dermatofibro sarcoma protuberens left groin   Past Surgical History:  Procedure Laterality Date  . Abdominal Doppler ultrasound  February 2009    Right ostial renal artery stenosis; normal renal structure.  . APPENDECTOMY  1968  . CYSTOSCOPY W/ URETEROSCOPY W/ LITHOTRIPSY   1999   Large calcium oxalate stone  . CYSTOSCOPY/RETROGRADE/URETEROSCOPY/STONE EXTRACTION WITH BASKET  1972  . ELECTROPHYSIOLOGIC STUDY N/A 05/13/2016   Procedure: Atrial Fibrillation Ablation;  Surgeon: Thompson Grayer, MD;  Location: [facility];  Service: Cardiovascular;  Laterality: N/A;  . EXCISIONAL HEMORRHOIDECTOMY   1981  . FRACTURE SURGERY    . IRRIGATION AND DEBRIDEMENT SEBACEOUS CYST  January 2015    chest wall; with antibiotics --> following this treatment, SVT episodes became much less frequent.  . ORIF PROXIMAL TIBIAL PLATEAU FRACTURE Left 2007   , Related shattered left tibial plateau with reattachment of torn ligaments and insertion of titanium plate and screws.  Marland Kitchen RENAL ARTERY DUPLEX  01/02/2014   R&L RA - mildly elevated velocities - < 60%; Bilateral Kidneys - normal shape & size.  Marland Kitchen SARCOMA EXCISION Left 2015   dermatofibro sarcoma protuberens, wide excision of groin from front side to buttocks"  . SOFT TISSUE BIOPSY Left 2015  . TOTAL ABDOMINAL HYSTERECTOMY  1994  . TRANSTHORACIC ECHOCARDIOGRAM  2013   normal  . TRANSTHORACIC ECHOCARDIOGRAM  March 2015   aortic sclerosis; without stenosis. Normal EF  . TREADMILL STRESS ECHO  01/2009   No regional wall motion abnormalities with stress == non-ischemic; rate related incomplete left bundle branch block     Current Outpatient Prescriptions  Medication Sig Dispense Refill  . ACCU-CHEK AVIVA PLUS test strip 1 EACH BY OTHER ROUTE 4 (FOUR) TIMES DAILY. USE AS INSTRUCTED DIAG E11.65  5  . ACCU-CHEK SOFTCLIX LANCETS lancets 1 EACH BY OTHER ROUTE 3 (THREE) TIMES A DAY.  5    . acetaminophen (TYLENOL) 325 MG tablet Take 2 tablets (650 mg total) by mouth every 4 (four) hours as needed for headache or mild pain.    . cholecalciferol (VITAMIN D) 1000 UNITS tablet Take 2,000 Units by mouth daily.     . clonazePAM (KLONOPIN) 0.5 MG tablet Take 0.25-0.5 mg by mouth at bedtime as needed for anxiety (sleep).   0  . diltiazem (CARDIZEM CD) 360 MG 24 hr capsule Take 1 capsule (360 mg total) by mouth daily. 90 capsule 3  . dofetilide (TIKOSYN) 125 MCG capsule Take 3 capsules (375 mcg total) by mouth 2 (two) times daily. 180 capsule 7  . Dulaglutide (TRULICITY) A999333 0000000 SOPN Inject 0.75 mg into the skin once a week. Thursday    . fenofibrate 160 MG tablet Take 160 mg by mouth daily.     . fluticasone (FLONASE) 50 MCG/ACT nasal spray Place 1-2 sprays into both nostrils at bedtime as needed for allergies or rhinitis.    Marland Kitchen glimepiride (AMARYL) 4 MG tablet Take 4 mg by mouth daily.   5  . Insulin Aspart (NOVOLOG FLEXPEN [place]) Inject 10 Units into the skin 3 (three) times daily before meals. If blood sugar is higher than 120 give 10 units before each meal    . Insulin Detemir (LEVEMIR FLEXTOUCH) 100 UNIT/ML Pen Inject 50 Units into the skin at bedtime.     . metFORMIN (GLUCOPHAGE-XR) 500 MG 24 hr tablet Take 1 tablet (500 mg total) by mouth daily with breakfast. 1000 MG TAKE IN THE EVENING    . metoprolol tartrate (LOPRESSOR) 25 MG tablet Take 1 tablet by mouth every 6 hours as needed for fast heart rates 180 tablet 3  . Multiple Vitamins-Minerals (PRESERVISION AREDS 2 PO) Take 1 tablet by mouth 2 (two) times daily.     Marland Kitchen omega-3 acid ethyl esters (LOVAZA) 1 G capsule Take 2 g by mouth 2 (two) times daily.     Marland Kitchen omeprazole (PRILOSEC OTC) 20 MG tablet Take 20 mg by mouth daily.    . potassium chloride SA (K-DUR,KLOR-CON) 20 MEQ tablet Take 1 tablet (20 mEq total) by mouth daily. 30 tablet 11  . ramipril (ALTACE) 10 MG capsule Take 10 mg by mouth daily.     . rivaroxaban (XARELTO) 20  MG TABS tablet Take 1 tablet (20 mg total) by mouth daily with supper. 30 tablet 11  . rosuvastatin (CRESTOR) 10 MG tablet TAKE 1 TABLET (10 MG TOTAL) BY MOUTH DAILY IN THE EVENING 30 tablet 6   No current facility-administered medications for this visit.     Allergies:   Fluorescein; Exenatide; Liraglutide; and Demerol [meperidine]   Social History:  The patient  reports that she has quit smoking. Her smoking use included Cigarettes. She has a 7.50 pack-year smoking history. She has never used smokeless tobacco. She reports that she does not drink alcohol or use drugs.   Family History:  The patient's  family history includes Breast cancer in her sister; Heart disease in her brother, father, and mother.    ROS:  Please see the history of present illness.   All other systems are reviewed and negative.    PHYSICAL EXAM: VS:  BP (!) 144/80 (BP Location: Left Arm, Patient Position: Sitting, Cuff Size: Normal)   Pulse 68   Ht 5' (1.524 m)   Wt 148 lb (67.1 kg)   BMI 28.90 kg/m  , BMI Body mass index is 28.9 kg/m. GEN: Well nourished, well developed, in no acute distress  HEENT: normal  Neck: no JVD, carotid bruits, or masses Cardiac: regular rate and rhythm; no murmurs, rubs, or gallops,no edema  Respiratory:  clear to auscultation bilaterally, normal work of breathing GI: soft, nontender, nondistended, + BS MS: no deformity or atrophy  Skin: warm and dry  Neuro:  Strength and sensation are intact Psych: euthymic mood, full affect  EKG:  EKG is ordered today. The ekg ordered today shows sinus rhythm 69 bpm, IVCD   Lipid Panel     Component Value Date/Time   CHOL 149 12/30/2013 0807   TRIG 456 (H) 12/30/2013 0807   HDL 31 (L) 12/30/2013 0807   LDLCALC NOT CALC 12/30/2013 0807     Wt Readings from Last 3 Encounters:  08/08/16 148 lb (67.1 kg)  06/13/16 149 lb 6.4 oz (67.8 kg)  05/13/16 152 lb 12.5 oz (69.3 kg)       ASSESSMENT AND PLAN:  1.  Persistent afib Doing  well s/p ablation She is reluctant to stop tikosyn. She will consider stopping upon return.  She is aware that I am ok with her stopping tikosyn at this time should she decide to do so chads2vasc score is at least 4.  Continue on xarelto  2. HTN Stable No change required today  3. CAD She was noted to have RCA lesion on recent cardiac CT.  Her myoview 6/17 was low risk.  She denies ischemic symptoms and would prefer a conservative approach at this time.  Continue crestor.  No asa as she is on xarelto.  Current medicines are reviewed at length with the patient today.   The patient does not have concerns regarding her medicines.  The following changes were made today:  none  Return to see me in 3 months Contact afib clinic if problems arise in the interim.  Signed, Thompson Grayer, MD    [facility] [place] [address] [phone] (office) [phone] (fax)

## 2016-08-08 NOTE — Patient Instructions (Signed)

## 2016-09-05 DIAGNOSIS — Z794 Long term (current) use of insulin: Secondary | ICD-10-CM | POA: Diagnosis not present

## 2016-09-05 DIAGNOSIS — E1165 Type 2 diabetes mellitus with hyperglycemia: Secondary | ICD-10-CM | POA: Diagnosis not present

## 2016-09-05 DIAGNOSIS — I10 Essential (primary) hypertension: Secondary | ICD-10-CM | POA: Diagnosis not present

## 2016-09-05 DIAGNOSIS — I701 Atherosclerosis of renal artery: Secondary | ICD-10-CM | POA: Diagnosis not present

## 2016-09-12 DIAGNOSIS — E1165 Type 2 diabetes mellitus with hyperglycemia: Secondary | ICD-10-CM | POA: Diagnosis not present

## 2016-09-12 DIAGNOSIS — Z794 Long term (current) use of insulin: Secondary | ICD-10-CM | POA: Diagnosis not present

## 2016-10-04 DIAGNOSIS — H353114 Nonexudative age-related macular degeneration, right eye, advanced atrophic with subfoveal involvement: Secondary | ICD-10-CM | POA: Diagnosis not present

## 2016-10-04 DIAGNOSIS — H2513 Age-related nuclear cataract, bilateral: Secondary | ICD-10-CM | POA: Diagnosis not present

## 2016-10-04 DIAGNOSIS — H353221 Exudative age-related macular degeneration, left eye, with active choroidal neovascularization: Secondary | ICD-10-CM | POA: Diagnosis not present

## 2016-10-16 DIAGNOSIS — I6502 Occlusion and stenosis of left vertebral artery: Secondary | ICD-10-CM

## 2016-10-16 HISTORY — DX: Occlusion and stenosis of left vertebral artery: I65.02

## 2016-10-20 ENCOUNTER — Encounter: Payer: Self-pay | Admitting: Internal Medicine

## 2016-10-24 ENCOUNTER — Telehealth (HOSPITAL_COMMUNITY): Payer: Self-pay | Admitting: *Deleted

## 2016-10-24 NOTE — Telephone Encounter (Signed)
Pt cld reporting dizziness and nausea after having stopped her Tikosyn this past Saturday.  Pt wanted to know if stopping the Tikosyn could cause these symptoms.  Per Roderic Palau, NP , stopping tikosyn would not cause this unless maybe she went into fib or flutter.  Per pt she is reg rhythm with HR of 85 and BP 159/80.  Pt has physical exam tomorrow and will have them check her out for this dizziness/nausea.

## 2016-10-25 DIAGNOSIS — R270 Ataxia, unspecified: Secondary | ICD-10-CM | POA: Diagnosis not present

## 2016-10-25 DIAGNOSIS — R29818 Other symptoms and signs involving the nervous system: Secondary | ICD-10-CM | POA: Diagnosis not present

## 2016-10-25 DIAGNOSIS — I701 Atherosclerosis of renal artery: Secondary | ICD-10-CM | POA: Diagnosis not present

## 2016-10-25 DIAGNOSIS — R930 Abnormal findings on diagnostic imaging of skull and head, not elsewhere classified: Secondary | ICD-10-CM | POA: Diagnosis not present

## 2016-10-25 DIAGNOSIS — I480 Paroxysmal atrial fibrillation: Secondary | ICD-10-CM | POA: Diagnosis not present

## 2016-10-25 DIAGNOSIS — C44799 Other specified malignant neoplasm of skin of left lower limb, including hip: Secondary | ICD-10-CM | POA: Diagnosis not present

## 2016-10-25 DIAGNOSIS — E1137X3 Type 2 diabetes mellitus with diabetic macular edema, resolved following treatment, bilateral: Secondary | ICD-10-CM | POA: Diagnosis not present

## 2016-10-25 DIAGNOSIS — I10 Essential (primary) hypertension: Secondary | ICD-10-CM | POA: Diagnosis not present

## 2016-10-25 DIAGNOSIS — R42 Dizziness and giddiness: Secondary | ICD-10-CM | POA: Diagnosis not present

## 2016-10-25 DIAGNOSIS — R9082 White matter disease, unspecified: Secondary | ICD-10-CM | POA: Diagnosis not present

## 2016-10-25 DIAGNOSIS — Z0000 Encounter for general adult medical examination without abnormal findings: Secondary | ICD-10-CM | POA: Diagnosis not present

## 2016-10-29 DIAGNOSIS — I6502 Occlusion and stenosis of left vertebral artery: Secondary | ICD-10-CM | POA: Insufficient documentation

## 2016-11-07 ENCOUNTER — Ambulatory Visit (INDEPENDENT_AMBULATORY_CARE_PROVIDER_SITE_OTHER): Payer: Medicare Other | Admitting: Internal Medicine

## 2016-11-07 ENCOUNTER — Encounter: Payer: Self-pay | Admitting: Internal Medicine

## 2016-11-07 VITALS — BP 144/80 | HR 84 | Ht 63.0 in | Wt 146.2 lb

## 2016-11-07 DIAGNOSIS — I480 Paroxysmal atrial fibrillation: Secondary | ICD-10-CM

## 2016-11-07 DIAGNOSIS — I10 Essential (primary) hypertension: Secondary | ICD-10-CM

## 2016-11-07 DIAGNOSIS — I4891 Unspecified atrial fibrillation: Secondary | ICD-10-CM

## 2016-11-07 MED ORDER — DILTIAZEM HCL ER COATED BEADS 120 MG PO CP24: 240.0000 mg | ORAL_CAPSULE | Freq: Every day | ORAL | 3 refills | Status: DC

## 2016-11-07 NOTE — Progress Notes (Signed)
Electrophysiology Office Note   Date:  11/07/2016   ID:  Isabella Wright, DOB 04/17/1946, MRN [id number]  PCP:  Curly Rim, MD  Cardiologist:  Dr Ellyn Hack Primary Electrophysiologist: Thompson Grayer, MD    CC: AFib   History of Present Illness: Isabella Wright is a 71 y.o. female who presents today for electrophysiology evaluation.  She is doing great post ablation.  Maintaining sinus rhythm.  She has been off of tikosyn for several weeks.  Today, she denies symptoms of palpitations, chest pain, orthopnea, PND, lower extremity edema, claudication, presyncope, syncope, bleeding, or neurologic sequela.  She had vertigo for which she was seen by Dr Neta Mends. She states that she was found to have a vertebral lesion (unable to further characterize) but has not been referred to neurology.  her crestor was switched to pravastatin by her endocrinologist because the patient felt that her LDL was too low.  No ischemic symptoms.  The patient is tolerating medications without difficulties and is otherwise without complaint today.    Past Medical History:  Diagnosis Date  . 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 Diabetes mellitus type 2, controlled ([place]) 2012  . GERD (gastroesophageal reflux disease)   . History of hiatal hernia   . Hypertension 2000   2009: Renal Dopplers showed R RA ostial stenosis -- (told not a problem or 20 yrs)  . Hypertriglyceridemia 1996   Very labile and difficult control: Began in '96 when she began taking Premarin after hysterectomy -- triglycerides increased to 3582 neuropathy in her feet> care for by endocrinologis; levels vary based on stress.;; Labs from March 2015: TC 150, TG 745, HDL 26, LDL 45  . Kidney stones   . LBBB (left bundle branch block) 2009   Initially diagnosed as rate related during stress echo.  . Macular degeneration    "dry in right; treating like wet in the left" (03/15/2016)  . New onset atrial fibrillation ([place])  01/04/2016   This patients CHA2DS2-VASc Score and unadjusted Ischemic Stroke Rate (% per year) is equal to 4.8 % stroke rate/year from a score of 4 -- 1 point each: HTN, DM, Age if 2-74, or Female]     . Paroxysmal SVT (supraventricular tachycardia) ([place]) 2009   less frequent following I&D of chest wall abscess (Jan 015), not previously documented, may have been afib.  . Sarcoma ([place]) 2015   dermatofibro sarcoma protuberens left groin   Past Surgical History:  Procedure Laterality Date  . Abdominal Doppler ultrasound  February 2009    Right ostial renal artery stenosis; normal renal structure.  . APPENDECTOMY  1968  . CYSTOSCOPY W/ URETEROSCOPY W/ LITHOTRIPSY   1999   Large calcium oxalate stone  . CYSTOSCOPY/RETROGRADE/URETEROSCOPY/STONE EXTRACTION WITH BASKET  1972  . ELECTROPHYSIOLOGIC STUDY N/A 05/13/2016   Procedure: Atrial Fibrillation Ablation;  Surgeon: Thompson Grayer, MD;  Location: [facility];  Service: Cardiovascular;  Laterality: N/A;  . EXCISIONAL HEMORRHOIDECTOMY   1981  . FRACTURE SURGERY    . IRRIGATION AND DEBRIDEMENT SEBACEOUS CYST  January 2015    chest wall; with antibiotics --> following this treatment, SVT episodes became much less frequent.  . ORIF PROXIMAL TIBIAL PLATEAU FRACTURE Left 2007   , Related shattered left tibial plateau with reattachment of torn ligaments and insertion of titanium plate and screws.  Marland Kitchen RENAL ARTERY DUPLEX  01/02/2014   R&L RA - mildly elevated velocities - < 60%; Bilateral Kidneys - normal shape & size.  Marland Kitchen  SARCOMA EXCISION Left 2015   dermatofibro sarcoma protuberens, wide excision of groin from front side to buttocks"  . SOFT TISSUE BIOPSY Left 2015  . TOTAL ABDOMINAL HYSTERECTOMY  1994  . TRANSTHORACIC ECHOCARDIOGRAM  2013   normal  . TRANSTHORACIC ECHOCARDIOGRAM  March 2015   aortic sclerosis; without stenosis. Normal EF  . TREADMILL STRESS ECHO  01/2009   No regional wall motion abnormalities with stress == non-ischemic;  rate related incomplete left bundle branch block     Current Outpatient Prescriptions  Medication Sig Dispense Refill  . ACCU-CHEK AVIVA PLUS test strip 1 EACH BY OTHER ROUTE 4 (FOUR) TIMES DAILY. USE AS INSTRUCTED DIAG E11.65  5  . ACCU-CHEK SOFTCLIX LANCETS lancets 1 EACH BY OTHER ROUTE 3 (THREE) TIMES A DAY.  5  . acetaminophen (TYLENOL) 325 MG tablet Take 2 tablets (650 mg total) by mouth every 4 (four) hours as needed for headache or mild pain.    . cholecalciferol (VITAMIN D) 1000 UNITS tablet Take 2,000 Units by mouth daily.     . clonazePAM (KLONOPIN) 0.5 MG tablet Take 0.25-0.5 mg by mouth at bedtime as needed for anxiety (sleep).   0  . diltiazem (CARDIZEM CD) 360 MG 24 hr capsule Take 1 capsule (360 mg total) by mouth daily. 90 capsule 3  . Dulaglutide (TRULICITY) 6.96 EX/5.2WU SOPN Inject 0.75 mg into the skin once a week. Thursday    . fenofibrate 160 MG tablet Take 160 mg by mouth daily.     . fluticasone (FLONASE) 50 MCG/ACT nasal spray Place 1-2 sprays into both nostrils at bedtime as needed for allergies or rhinitis.    Marland Kitchen glimepiride (AMARYL) 4 MG tablet Take 4 mg by mouth daily.   5  . Insulin Aspart (NOVOLOG FLEXPEN [place]) Inject 10 Units into the skin 3 (three) times daily before meals. If blood sugar is higher than 120 give 10 units before each meal    . Insulin Detemir (LEVEMIR FLEXTOUCH) 100 UNIT/ML Pen Inject 50 Units into the skin at bedtime.     . metFORMIN (GLUCOPHAGE-XR) 500 MG 24 hr tablet Take 1 tablet (500 mg total) by mouth daily with breakfast. 1000 MG TAKE IN THE EVENING    . metoprolol tartrate (LOPRESSOR) 25 MG tablet Take 1 tablet by mouth every 6 hours as needed for fast heart rates 180 tablet 3  . Multiple Vitamins-Minerals (PRESERVISION AREDS 2 PO) Take 1 tablet by mouth 2 (two) times daily.     Marland Kitchen omega-3 acid ethyl esters (LOVAZA) 1 G capsule Take 2 g by mouth 2 (two) times daily.     Marland Kitchen omeprazole (PRILOSEC OTC) 20 MG tablet Take 20 mg by mouth daily.    .  potassium chloride SA (K-DUR,KLOR-CON) 20 MEQ tablet Take 1 tablet (20 mEq total) by mouth daily. 30 tablet 11  . pravastatin (PRAVACHOL) 20 MG tablet Take 20 mg by mouth daily.    . ramipril (ALTACE) 10 MG capsule Take 10 mg by mouth daily.     . rivaroxaban (XARELTO) 20 MG TABS tablet Take 1 tablet (20 mg total) by mouth daily with supper. 30 tablet 11   No current facility-administered medications for this visit.     Allergies:   Fluorescein; Exenatide; Liraglutide; and Demerol [meperidine]   Social History:  The patient  reports that she has quit smoking. Her smoking use included Cigarettes. She has a 7.50 pack-year smoking history. She has never used smokeless tobacco. She reports that she does not drink alcohol  or use drugs.   Family History:  The patient's  family history includes Breast cancer in her sister; Heart disease in her brother, father, and mother.    ROS:  Please see the history of present illness.   All other systems are reviewed and negative.    PHYSICAL EXAM: VS:  BP (!) 144/80   Pulse 84   Ht 5\' 3"  (1.6 m)   Wt 146 lb 3.2 oz (66.3 kg)   SpO2 97%   BMI 25.90 kg/m  , BMI Body mass index is 25.9 kg/m. GEN: Well nourished, well developed, in no acute distress  HEENT: normal  Neck: no JVD, carotid bruits, or masses Cardiac: regular rate and rhythm; no murmurs, rubs, or gallops,no edema  Respiratory:  clear to auscultation bilaterally, normal work of breathing GI: soft, nontender, nondistended, + BS MS: no deformity or atrophy  Skin: warm and dry  Neuro:  Strength and sensation are intact Psych: anxious and animated today  EKG:  EKG is ordered today. The ekg tracing today is personally reviewed and shows sinus rhythm 84 bpm, LBBB   Lipid Panel     Component Value Date/Time   CHOL 149 12/30/2013 0807   TRIG 456 (H) 12/30/2013 0807   HDL 31 (L) 12/30/2013 0807   LDLCALC NOT CALC 12/30/2013 0807     Wt Readings from Last 3 Encounters:  11/07/16 146 lb  3.2 oz (66.3 kg)  08/08/16 148 lb (67.1 kg)  06/13/16 149 lb 6.4 oz (67.8 kg)       ASSESSMENT AND PLAN:  1.  Persistent afib Doing well s/p ablation off AAD therapy Reduce diltiazem CD to 240mg  daily today Can reduce further on follow-up if no afib chads2vasc score is at least 4.  Continue on xarelto  2. HTN Stable No change required today  3. CAD She was noted to have RCA lesion on preablation cardiac CT.  Her myoview 6/17 was low risk.  She denies ischemic symptoms and would prefer a conservative approach at this time.  No asa as she is on xarelto.  She reports that lipids are well controlled but recently had crestor changed to pravastatin by her endocrinologist because the patient felt that her LDL was too low.  I have encouraged her to have repeat lipids in 2 months with endocrinology.  She will alert our office with any ischemic symptoms.  4. Hypokalemia Appears resolved (recent labs from endocrine reveals K=4.2)  Stop KDur today She should have repeat bmet with her fasting lipids on follow-up with endocrine   Return to see Butch Penny in AF clinic in 3 months I will see in 6 months Contact afib clinic if problems arise in the interim.  Signed, Thompson Grayer, MD    [address] [place] [address] [phone] (office) [phone] (fax)

## 2016-11-07 NOTE — Patient Instructions (Signed)
Medication Instructions:  Your physician has recommended you make the following change in your medication:  1) Stop Potassium 2) Decrease Diltiazem to 120mg  ---2 daily   Labwork: None ordered   Testing/Procedures: None ordered   Follow-Up: Your physician recommends that you schedule a follow-up appointment in: 3 months with Roderic Palau, NP and 6 months with Dr Rayann Heman   Any Other Special Instructions Will Be Listed Below (If Applicable).     If you need a refill on your cardiac medications before your next appointment, please call your pharmacy.

## 2016-11-22 DIAGNOSIS — L821 Other seborrheic keratosis: Secondary | ICD-10-CM | POA: Diagnosis not present

## 2016-11-22 DIAGNOSIS — D225 Melanocytic nevi of trunk: Secondary | ICD-10-CM | POA: Diagnosis not present

## 2016-11-22 DIAGNOSIS — D1801 Hemangioma of skin and subcutaneous tissue: Secondary | ICD-10-CM | POA: Diagnosis not present

## 2016-11-22 DIAGNOSIS — L570 Actinic keratosis: Secondary | ICD-10-CM | POA: Diagnosis not present

## 2016-11-22 DIAGNOSIS — D485 Neoplasm of uncertain behavior of skin: Secondary | ICD-10-CM | POA: Diagnosis not present

## 2016-11-22 DIAGNOSIS — L820 Inflamed seborrheic keratosis: Secondary | ICD-10-CM | POA: Diagnosis not present

## 2016-11-22 DIAGNOSIS — L720 Epidermal cyst: Secondary | ICD-10-CM | POA: Diagnosis not present

## 2016-11-22 DIAGNOSIS — L738 Other specified follicular disorders: Secondary | ICD-10-CM | POA: Diagnosis not present

## 2016-11-26 ENCOUNTER — Other Ambulatory Visit: Payer: Self-pay | Admitting: Cardiology

## 2016-11-28 DIAGNOSIS — C499 Malignant neoplasm of connective and soft tissue, unspecified: Secondary | ICD-10-CM | POA: Diagnosis not present

## 2016-11-28 DIAGNOSIS — C44799 Other specified malignant neoplasm of skin of left lower limb, including hip: Secondary | ICD-10-CM | POA: Diagnosis not present

## 2016-12-07 DIAGNOSIS — I10 Essential (primary) hypertension: Secondary | ICD-10-CM | POA: Diagnosis not present

## 2016-12-07 DIAGNOSIS — E1169 Type 2 diabetes mellitus with other specified complication: Secondary | ICD-10-CM | POA: Diagnosis not present

## 2016-12-07 DIAGNOSIS — I701 Atherosclerosis of renal artery: Secondary | ICD-10-CM | POA: Diagnosis not present

## 2016-12-07 DIAGNOSIS — E785 Hyperlipidemia, unspecified: Secondary | ICD-10-CM | POA: Diagnosis not present

## 2016-12-20 DIAGNOSIS — E119 Type 2 diabetes mellitus without complications: Secondary | ICD-10-CM | POA: Diagnosis not present

## 2016-12-20 DIAGNOSIS — H353221 Exudative age-related macular degeneration, left eye, with active choroidal neovascularization: Secondary | ICD-10-CM | POA: Diagnosis not present

## 2016-12-20 DIAGNOSIS — H353114 Nonexudative age-related macular degeneration, right eye, advanced atrophic with subfoveal involvement: Secondary | ICD-10-CM | POA: Diagnosis not present

## 2017-01-05 ENCOUNTER — Emergency Department (HOSPITAL_COMMUNITY)
Admission: EM | Admit: 2017-01-05 | Discharge: 2017-01-05 | Disposition: A | Discharge status: Home / Self Care | Payer: Medicare Other | Attending: Emergency Medicine | Admitting: Emergency Medicine

## 2017-01-05 ENCOUNTER — Encounter (HOSPITAL_COMMUNITY): Payer: Self-pay | Admitting: Emergency Medicine

## 2017-01-05 ENCOUNTER — Emergency Department (HOSPITAL_COMMUNITY): Payer: Medicare Other

## 2017-01-05 DIAGNOSIS — M25561 Pain in right knee: Secondary | ICD-10-CM | POA: Diagnosis not present

## 2017-01-05 DIAGNOSIS — X509XXA Other and unspecified overexertion or strenuous movements or postures, initial encounter: Secondary | ICD-10-CM | POA: Insufficient documentation

## 2017-01-05 DIAGNOSIS — Y999 Unspecified external cause status: Secondary | ICD-10-CM | POA: Diagnosis not present

## 2017-01-05 DIAGNOSIS — Y929 Unspecified place or not applicable: Secondary | ICD-10-CM | POA: Diagnosis not present

## 2017-01-05 DIAGNOSIS — Z85831 Personal history of malignant neoplasm of soft tissue: Secondary | ICD-10-CM | POA: Diagnosis not present

## 2017-01-05 DIAGNOSIS — E119 Type 2 diabetes mellitus without complications: Secondary | ICD-10-CM | POA: Diagnosis not present

## 2017-01-05 DIAGNOSIS — S8991XA Unspecified injury of right lower leg, initial encounter: Secondary | ICD-10-CM | POA: Diagnosis not present

## 2017-01-05 DIAGNOSIS — R531 Weakness: Secondary | ICD-10-CM | POA: Diagnosis not present

## 2017-01-05 DIAGNOSIS — I10 Essential (primary) hypertension: Secondary | ICD-10-CM | POA: Diagnosis not present

## 2017-01-05 DIAGNOSIS — Z87891 Personal history of nicotine dependence: Secondary | ICD-10-CM | POA: Insufficient documentation

## 2017-01-05 DIAGNOSIS — Y9389 Activity, other specified: Secondary | ICD-10-CM | POA: Diagnosis not present

## 2017-01-05 DIAGNOSIS — Z794 Long term (current) use of insulin: Secondary | ICD-10-CM | POA: Diagnosis not present

## 2017-01-05 DIAGNOSIS — Z79899 Other long term (current) drug therapy: Secondary | ICD-10-CM | POA: Diagnosis not present

## 2017-01-05 DIAGNOSIS — R404 Transient alteration of awareness: Secondary | ICD-10-CM | POA: Diagnosis not present

## 2017-01-05 MED ORDER — HYDROCODONE-ACETAMINOPHEN 5-325 MG PO TABS: 1.0000 | ORAL_TABLET | Freq: Four times a day (QID) | ORAL | 0 refills | Status: DC | PRN

## 2017-01-05 NOTE — ED Notes (Signed)
Ortho tech made aware of new orders

## 2017-01-05 NOTE — ED Notes (Signed)
Bed: WTR9 Expected date:  Expected time:  Means of arrival:  Comments: EMS 71 yo female knee pain and swelling medial right knee

## 2017-01-05 NOTE — ED Notes (Signed)
Pt did not want to be pushed back into room for vital signs due to pain upon movement.  Pt verbalized understanding of discharge instructions.

## 2017-01-05 NOTE — Discharge Instructions (Addendum)
You have been seen today for a knee injury. There were no acute abnormalities on the x-rays, including no sign of fracture or dislocation, however, there is a suspected injury to the right MCL. Pain: May use tylenol as needed for pain. Vicodin for severe pain. Do not drive or perform other dangerous activities while taking the Vicodin. Also note that Vicodin contains 325mg  of tylenol per pill. Limit daily intake of tylenol to less than 4000mg . Elevation: Keep the extremity elevated as often as possible to reduce pain and inflammation. Support: Wear the knee immobilizer for support and comfort.  Follow up: Follow up with the orthopedic specialist as soon as possible. Call the number provided to set up an appointment.

## 2017-01-05 NOTE — ED Triage Notes (Signed)
Pt comes from home via EMS with complaints of right medial knee pain. Pt is not ambulatory and is unable to place weight on that knee. A&O x4. Pt was moving something in her house (shelf level) and turned and felt something pop. Slightly hypertensive in route at 160/62. Other vitals WNL.

## 2017-01-05 NOTE — ED Notes (Signed)
Patient transported to X-ray 

## 2017-01-05 NOTE — ED Provider Notes (Signed)
[facility] DEPT Provider Note   CSN: [id number] Arrival date & time: 01/05/17  1941     History   Chief Complaint No chief complaint on file.   HPI Isabella Wright is a 71 y.o. female.  HPI  Isabella Wright is a 71 y.o. female, with a history of GERD, DM, and A. fib, presenting to the ED with right knee pain beginning shortly before arrival. Was pulling on an object when she felt sudden onset of right anterior knee pain. For six weeks prior to this, she has been experiencing aching right knee pain since running [place] the beach with [place]. Current pain is severe, throbbing, worse with movement, weightbearing, or palpation. Also endorses some swelling. She has not taken any medications for this. She is unable to bear weight due to pain. Patient denies neuro deficits, LOC, head injury, neck/back pain, or any other complaints. Patient has not had a previous injury or surgery to this knee.    Past Medical History:  Diagnosis Date  . 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 Diabetes mellitus type 2, controlled ([place]) 2012  . GERD (gastroesophageal reflux disease)   . History of hiatal hernia   . Hypertension 2000   2009: Renal Dopplers showed R RA ostial stenosis -- (told not a problem or 20 yrs)  . Hypertriglyceridemia 1996   Very labile and difficult control: Began in '96 when she began taking Premarin after hysterectomy -- triglycerides increased to 3582 neuropathy in her feet> care for by endocrinologis; levels vary based on stress.;; Labs from March 2015: TC 150, TG 745, HDL 26, LDL 45  . Kidney stones   . LBBB (left bundle branch block) 2009   Initially diagnosed as rate related during stress echo.  . Macular degeneration    "dry in right; treating like wet in the left" (03/15/2016)  . New onset atrial fibrillation ([place]) 01/04/2016   This patients CHA2DS2-VASc Score and unadjusted Ischemic Stroke Rate (% per year) is equal to 4.8 % stroke rate/year  from a score of 4 -- 1 point each: HTN, DM, Age if 46-74, or Female]     . Paroxysmal SVT (supraventricular tachycardia) ([place]) 2009   less frequent following I&D of chest wall abscess (Jan 015), not previously documented, may have been afib.  . Sarcoma ([place]) 2015   dermatofibro sarcoma protuberens left groin    Patient Active Problem List   Diagnosis Date Noted  . A-fib ([place]) 05/13/2016  . Persistent atrial fibrillation ([place]) 03/15/2016  . New onset atrial fibrillation ([place]) 01/05/2016  . Bradycardia 01/05/2016  . Essential hypertension 03/19/2014  . Encounter for long-term (current) use of other medications 12/03/2013  . Renal artery stenosis ([place]) 12/03/2013  . Mixed hyperlipidemia: High triglycerides, low HDL 12/03/2013  . LBBB (left bundle branch block) 12/03/2013  . SVT (supraventricular tachycardia) ([place]) 12/03/2013    Past Surgical History:  Procedure Laterality Date  . Abdominal Doppler ultrasound  February 2009    Right ostial renal artery stenosis; normal renal structure.  . APPENDECTOMY  1968  . CYSTOSCOPY W/ URETEROSCOPY W/ LITHOTRIPSY   1999   Large calcium oxalate stone  . CYSTOSCOPY/RETROGRADE/URETEROSCOPY/STONE EXTRACTION WITH BASKET  1972  . ELECTROPHYSIOLOGIC STUDY N/A 05/13/2016   Procedure: Atrial Fibrillation Ablation;  Surgeon: Thompson Grayer, MD;  Location: [facility];  Service: Cardiovascular;  Laterality: N/A;  . EXCISIONAL HEMORRHOIDECTOMY   1981  . FRACTURE SURGERY    . IRRIGATION AND DEBRIDEMENT SEBACEOUS CYST  January 2015    chest wall; with antibiotics --> following this treatment, SVT episodes became much less frequent.  . ORIF PROXIMAL TIBIAL PLATEAU FRACTURE Left 2007   , Related shattered left tibial plateau with reattachment of torn ligaments and insertion of titanium plate and screws.  Marland Kitchen RENAL ARTERY DUPLEX  01/02/2014   R&L RA - mildly elevated velocities - < 60%; Bilateral Kidneys - normal shape & size.  Marland Kitchen SARCOMA EXCISION Left 2015     dermatofibro sarcoma protuberens, wide excision of groin from front side to buttocks"  . SOFT TISSUE BIOPSY Left 2015  . TOTAL ABDOMINAL HYSTERECTOMY  1994  . TRANSTHORACIC ECHOCARDIOGRAM  2013   normal  . TRANSTHORACIC ECHOCARDIOGRAM  March 2015   aortic sclerosis; without stenosis. Normal EF  . TREADMILL STRESS ECHO  01/2009   No regional wall motion abnormalities with stress == non-ischemic; rate related incomplete left bundle branch block    OB History    No data available       Home Medications    Prior to Admission medications   Medication Sig Start Date End Date Taking? Authorizing Provider  ACCU-CHEK AVIVA PLUS test strip 1 EACH BY OTHER ROUTE 4 (FOUR) TIMES DAILY. USE AS INSTRUCTED DIAG E11.65 03/22/16   [provider]  ACCU-CHEK SOFTCLIX LANCETS lancets 1 EACH BY OTHER ROUTE 3 (THREE) TIMES A DAY. 06/23/16   [provider]  acetaminophen (TYLENOL) 325 MG tablet Take 2 tablets (650 mg total) by mouth every 4 (four) hours as needed for headache or mild pain. 05/14/16   Erlene Quan, PA-C  cholecalciferol (VITAMIN D) 1000 UNITS tablet Take 2,000 Units by mouth daily.     [provider]  clonazePAM (KLONOPIN) 0.5 MG tablet Take 0.25-0.5 mg by mouth at bedtime as needed for anxiety (sleep).  04/28/16   [provider]  diltiazem (CARDIZEM CD) 120 MG 24 hr capsule Take 2 capsules (240 mg total) by mouth daily. 11/07/16   Allred, Jeneen Rinks, MD  Dulaglutide (TRULICITY) 5.05 LZ/7.6BH SOPN Inject 0.75 mg into the skin once a week. Thursday 11/16/15   [provider]  fenofibrate 160 MG tablet Take 160 mg by mouth daily.  11/06/13   [provider]  fluticasone (FLONASE) 50 MCG/ACT nasal spray Place 1-2 sprays into both nostrils at bedtime as needed for allergies or rhinitis.    [provider]  glimepiride (AMARYL) 4 MG tablet Take 4 mg by mouth daily.  08/20/14   [provider]  HYDROcodone-acetaminophen  (NORCO/VICODIN) 5-325 MG tablet Take 1-2 tablets by mouth every 6 (six) hours as needed for severe pain. 01/05/17   Ersel Wadleigh C, PA-C  Insulin Aspart (NOVOLOG FLEXPEN [place]) Inject 10 Units into the skin 3 (three) times daily before meals. If blood sugar is higher than 120 give 10 units before each meal    [provider]  Insulin Detemir (LEVEMIR FLEXTOUCH) 100 UNIT/ML Pen Inject 50 Units into the skin at bedtime.  08/31/15   [provider]  metFORMIN (GLUCOPHAGE-XR) 500 MG 24 hr tablet Take 1 tablet (500 mg total) by mouth daily with breakfast. 1000 MG TAKE IN THE EVENING 05/16/16   Kerin Ransom K, PA-C  metoprolol tartrate (LOPRESSOR) 25 MG tablet Take 1 tablet by mouth every 6 hours as needed for fast heart rates 03/14/16   Allred, Jeneen Rinks, MD  Multiple Vitamins-Minerals (PRESERVISION AREDS 2 PO) Take 1 tablet by mouth 2 (two) times daily.     [provider]  omega-3 acid ethyl esters (LOVAZA) 1 G capsule Take 2 g by mouth 2 (two) times daily.  10/09/13   [provider]  omeprazole (PRILOSEC OTC) 20 MG tablet Take 20 mg by mouth daily.    [provider]  pravastatin (PRAVACHOL) 20 MG tablet Take 20 mg by mouth daily. 09/13/16 09/13/17  [provider]  ramipril (ALTACE) 10 MG capsule Take 10 mg by mouth daily.  09/02/13   [provider]  XARELTO 20 MG TABS tablet TAKE 1 TABLET (20 MG TOTAL) BY MOUTH DAILY WITH SUPPER. 11/28/16   Leonie Man, MD    Family History Family History  Problem Relation Age of Onset  . Heart disease Brother        Died at 80  . Heart disease Mother        Died at 45  . Heart disease Father        Died at 39  . Breast cancer Sister        Died at 35    Social History Social History  Substance Use Topics  . Smoking status: Former Smoker    Packs/day: 0.50    Years: 15.00    Types: Cigarettes  . 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 Alcohol use No     Allergies   Fluorescein; Exenatide; Liraglutide; and Demerol [meperidine]   Review of Systems Review of Systems  Musculoskeletal: Positive for arthralgias and joint swelling. Negative for back pain and neck pain.  Neurological: Negative for weakness and numbness.     Physical Exam Updated Vital Signs There were no vitals taken for this visit. Patient refused vital signs  Physical Exam  Constitutional: She appears well-developed and well-nourished. No distress.  HENT:  Head: Normocephalic and atraumatic.  Eyes: Conjunctivae are normal.  Neck: Neck supple.  Cardiovascular: Normal rate, regular rhythm and intact distal pulses.   Pulmonary/Chest: Effort normal.  Musculoskeletal: She exhibits edema and tenderness. She exhibits no deformity.  Tenderness along the medial right knee. Laxity and pain elicited along the medial right knee with valgus stress. Knee easily returns to baseline position. Extension and flexion of the knee limited by pain, especially at extremes of range of motion.  There is no tenderness, instability, crepitus, or swelling noted along the length of the tibia or fibula. The patellar and quadriceps tendons appear to be intact. The patella is in anatomical position.  Neurological: She is alert.  No sensory deficits in the right lower extremity. Strength 5 out of 5 at the right hip and ankle. Strength in the right knee limited by pain.  Skin: Skin is warm and dry. Capillary refill takes less than 2 seconds. She is not diaphoretic.  Psychiatric: She has a normal mood and affect. Her behavior is normal.  Nursing note and vitals reviewed.    ED Treatments / Results  Labs (all labs ordered are listed, but only abnormal results are displayed) Labs Reviewed - No data to display  EKG  EKG Interpretation None       Radiology Dg Knee Complete 4 Views Right  Result Date: 01/05/2017 CLINICAL DATA:  Knee pain.  Unable to  ambulate. EXAM: RIGHT KNEE - COMPLETE 4+ VIEW COMPARISON:  None. FINDINGS: No evidence of fracture, dislocation, or joint effusion. Mild patellofemoral degenerative change without other focal bone abnormality. Soft tissues are unremarkable. IMPRESSION: No acute abnormality of  the right knee. Electronically Signed   By: Ulyses Jarred M.D.   On: 01/05/2017 20:15    Procedures Procedures (including critical care time)  Medications Ordered in ED Medications - No data to display   Initial Impression / Assessment and Plan / ED Course  I have reviewed the triage vital signs and the nursing notes.  Pertinent labs & imaging results that were available during my care of the patient were reviewed by me and considered in my medical decision making (see chart for details).     Patient presents with right knee injury. Suspect MCL disruption. Knee immobilizer, crutches, and orthopedic follow-up. Patient demonstrated ability to use crutches without difficulty. The patient was given instructions for home care as well as return precautions. Patient voices understanding of these instructions, accepts the plan, and is comfortable with discharge.  Findings and plan of care discussed with Gareth Morgan, MD. Dr. Billy Fischer personally evaluated and examined this patient.  Final Clinical Impressions(s) / ED Diagnoses   Final diagnoses:  Injury of right knee, initial encounter    New Prescriptions Discharge Medication List as of 01/05/2017 10:15 PM    START taking these medications   Details  HYDROcodone-acetaminophen (NORCO/VICODIN) 5-325 MG tablet Take 1-2 tablets by mouth every 6 (six) hours as needed for severe pain., Starting Thu 01/05/2017, Print         Shakur Lembo C, PA-C 01/06/17 0120    Gareth Morgan, MD 01/06/17 1427

## 2017-01-09 ENCOUNTER — Ambulatory Visit (INDEPENDENT_AMBULATORY_CARE_PROVIDER_SITE_OTHER): Payer: Medicare Other | Admitting: Orthopaedic Surgery

## 2017-01-09 ENCOUNTER — Encounter (INDEPENDENT_AMBULATORY_CARE_PROVIDER_SITE_OTHER): Payer: Self-pay | Admitting: Orthopaedic Surgery

## 2017-01-09 DIAGNOSIS — M25561 Pain in right knee: Secondary | ICD-10-CM

## 2017-01-09 NOTE — Progress Notes (Signed)
Office Visit Note   Patient: Isabella Wright           Date of Birth: 15-Dec-1945           MRN: [id number] Visit Date: 01/09/2017              Requested by: Curly Rim, MD [place], [place] [address] PCP: Curly Rim, MD   Assessment & Plan: Visit Diagnoses:  1. Acute pain of right knee     Plan: Overall impression is degenerative medial meniscal tear. I aspirated her knee and injected with cortisone today. She has my card. She does not have any significant improvement over the next several weeks she should give [name] a call back to consider MRI.  Follow-Up Instructions: Return if symptoms worsen or fail to improve.   Orders:  No orders of the defined types were placed in this encounter.  No orders of the defined types were placed in this encounter.     Procedures: Large Joint Inj Date/Time: 01/09/2017 4:27 PM Performed by: Leandrew Koyanagi Authorized by: Leandrew Koyanagi   Consent Given by:  Patient Timeout: prior to procedure the correct patient, procedure, and site was verified   Indications:  Pain Location:  Knee Site:  R knee Prep: patient was prepped and draped in usual sterile fashion   Needle Size:  22 G Ultrasound Guidance: No   Fluoroscopic Guidance: No   Arthrogram: No   Aspirate amount (mL):  10 Aspirate:  Blood-tinged Patient tolerance:  Patient tolerated the procedure well with no immediate complications     Clinical Data: No additional findings.   Subjective: Chief Complaint  Patient presents with  . Right Knee - Pain, Injury    Patient is a 71 year old female who comes in with right knee pain for 6-8 weeks. She denies any injuries. She states she started having the pain after she was [place] the beach. She now complains mainly severe medial sided right knee pain. She did have x-rays in the ER which were negative. She is endorse and swelling. The pain does not radiate. The pain is worse with ambulation. She  is been walking with a cane. She has not fallen. She is not taking any medicines. She is on chronic Xarelto for atrial fibrillation.    Review of Systems  Constitutional: Negative.   HENT: Negative.   Eyes: Negative.   Respiratory: Negative.   Cardiovascular: Negative.   Endocrine: Negative.   Musculoskeletal: Negative.   Neurological: Negative.   Hematological: Negative.   Psychiatric/Behavioral: Negative.   All other systems reviewed and are negative.    Objective: Vital Signs: There were no vitals taken for this visit.  Physical Exam  Constitutional: She is oriented to person, place, and time. She appears well-developed and well-nourished.  HENT:  Head: Normocephalic and atraumatic.  Eyes: EOM are normal.  Neck: Neck supple.  Pulmonary/Chest: Effort normal.  Abdominal: Soft.  Neurological: She is alert and oriented to person, place, and time.  Skin: Skin is warm. Capillary refill takes less than 2 seconds.  Psychiatric: She has a normal mood and affect. Her behavior is normal. Judgment and thought content normal.  Nursing note and vitals reviewed.   Ortho Exam Right knee exam shows a small effusion. She has exquisite medial joint line tenderness. She also has medial joint line tenderness with McMurray testing. Thessaly testing not done secondary to severe pain. She does have an antalgic gait.  Collaterals and cruciates are stable. Specialty Comments:  No specialty comments available.  Imaging: No results found.   PMFS History: Patient Active Problem List   Diagnosis Date Noted  . A-fib ([place]) 05/13/2016  . Persistent atrial fibrillation ([place]) 03/15/2016  . New onset atrial fibrillation ([place]) 01/05/2016  . Bradycardia 01/05/2016  . Essential hypertension 03/19/2014  . Encounter for long-term (current) use of other medications 12/03/2013  . Renal artery stenosis ([place]) 12/03/2013  . Mixed hyperlipidemia: High triglycerides, low HDL 12/03/2013  . LBBB (left bundle  branch block) 12/03/2013  . SVT (supraventricular tachycardia) ([place]) 12/03/2013   Past Medical History:  Diagnosis Date  . 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 Diabetes mellitus type 2, controlled ([place]) 2012  . GERD (gastroesophageal reflux disease)   . History of hiatal hernia   . Hypertension 2000   2009: Renal Dopplers showed R RA ostial stenosis -- (told not a problem or 20 yrs)  . Hypertriglyceridemia 1996   Very labile and difficult control: Began in '96 when she began taking Premarin after hysterectomy -- triglycerides increased to 3582 neuropathy in her feet> care for by endocrinologis; levels vary based on stress.;; Labs from March 2015: TC 150, TG 745, HDL 26, LDL 45  . Kidney stones   . LBBB (left bundle branch block) 2009   Initially diagnosed as rate related during stress echo.  . Macular degeneration    "dry in right; treating like wet in the left" (03/15/2016)  . New onset atrial fibrillation ([place]) 01/04/2016   This patients CHA2DS2-VASc Score and unadjusted Ischemic Stroke Rate (% per year) is equal to 4.8 % stroke rate/year from a score of 4 -- 1 point each: HTN, DM, Age if 72-74, or Female]     . Paroxysmal SVT (supraventricular tachycardia) ([place]) 2009   less frequent following I&D of chest wall abscess (Jan 015), not previously documented, may have been afib.  . Sarcoma ([place]) 2015   dermatofibro sarcoma protuberens left groin    Family History  Problem Relation Age of Onset  . Heart disease Brother        Died at 63  . Heart disease Mother        Died at 67  . Heart disease Father        Died at 76  . Breast cancer Sister        Died at 81    Past Surgical History:  Procedure Laterality Date  . Abdominal Doppler ultrasound  February 2009    Right ostial renal artery stenosis; normal renal structure.  . APPENDECTOMY  1968  . CYSTOSCOPY W/ URETEROSCOPY W/ LITHOTRIPSY   1999   Large calcium oxalate stone  .  CYSTOSCOPY/RETROGRADE/URETEROSCOPY/STONE EXTRACTION WITH BASKET  1972  . ELECTROPHYSIOLOGIC STUDY N/A 05/13/2016   Procedure: Atrial Fibrillation Ablation;  Surgeon: Thompson Grayer, MD;  Location: [facility];  Service: Cardiovascular;  Laterality: N/A;  . EXCISIONAL HEMORRHOIDECTOMY   1981  . FRACTURE SURGERY    . IRRIGATION AND DEBRIDEMENT SEBACEOUS CYST  January 2015    chest wall; with antibiotics --> following this treatment, SVT episodes became much less frequent.  . ORIF PROXIMAL TIBIAL PLATEAU FRACTURE Left 2007   , Related shattered left tibial plateau with reattachment of torn ligaments and insertion of titanium plate and screws.  Marland Kitchen RENAL ARTERY DUPLEX  01/02/2014   R&L RA - mildly elevated velocities - < 60%; Bilateral Kidneys - normal shape & size.  Marland Kitchen SARCOMA  EXCISION Left 2015   dermatofibro sarcoma protuberens, wide excision of groin from front side to buttocks"  . SOFT TISSUE BIOPSY Left 2015  . TOTAL ABDOMINAL HYSTERECTOMY  1994  . TRANSTHORACIC ECHOCARDIOGRAM  2013   normal  . TRANSTHORACIC ECHOCARDIOGRAM  March 2015   aortic sclerosis; without stenosis. Normal EF  . TREADMILL STRESS ECHO  01/2009   No regional wall motion abnormalities with stress == non-ischemic; rate related incomplete left bundle branch block   Social History   Occupational History  . Not on file.   Social History Main Topics  . Smoking status: Former Smoker    Packs/day: 0.50    Years: 15.00    Types: Cigarettes  . 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 Alcohol use No  . Drug use: No  . Sexual activity: Not Currently

## 2017-01-20 DIAGNOSIS — I447 Left bundle-branch block, unspecified: Secondary | ICD-10-CM | POA: Diagnosis not present

## 2017-01-20 DIAGNOSIS — Z08 Encounter for follow-up examination after completed treatment for malignant neoplasm: Secondary | ICD-10-CM | POA: Diagnosis not present

## 2017-01-20 DIAGNOSIS — Z9889 Other specified postprocedural states: Secondary | ICD-10-CM | POA: Diagnosis not present

## 2017-01-20 DIAGNOSIS — N6082 Other benign mammary dysplasias of left breast: Secondary | ICD-10-CM | POA: Diagnosis not present

## 2017-01-20 DIAGNOSIS — I471 Supraventricular tachycardia: Secondary | ICD-10-CM | POA: Diagnosis not present

## 2017-01-20 DIAGNOSIS — Z87898 Personal history of other specified conditions: Secondary | ICD-10-CM | POA: Diagnosis not present

## 2017-01-20 DIAGNOSIS — Z85828 Personal history of other malignant neoplasm of skin: Secondary | ICD-10-CM | POA: Diagnosis not present

## 2017-01-20 DIAGNOSIS — I10 Essential (primary) hypertension: Secondary | ICD-10-CM | POA: Diagnosis not present

## 2017-01-20 DIAGNOSIS — E119 Type 2 diabetes mellitus without complications: Secondary | ICD-10-CM | POA: Diagnosis not present

## 2017-01-20 DIAGNOSIS — F329 Major depressive disorder, single episode, unspecified: Secondary | ICD-10-CM | POA: Diagnosis not present

## 2017-01-26 ENCOUNTER — Other Ambulatory Visit: Payer: Self-pay | Admitting: Cardiology

## 2017-01-30 ENCOUNTER — Encounter (HOSPITAL_COMMUNITY): Payer: Self-pay | Admitting: Nurse Practitioner

## 2017-01-30 ENCOUNTER — Ambulatory Visit (HOSPITAL_COMMUNITY)
Admission: RE | Admit: 2017-01-30 | Discharge: 2017-01-30 | Disposition: A | Discharge status: Home / Self Care | Payer: Medicare Other | Source: Ambulatory Visit | Attending: Nurse Practitioner | Admitting: Nurse Practitioner

## 2017-01-30 VITALS — BP 146/80 | HR 85 | Ht 63.0 in | Wt 141.8 lb

## 2017-01-30 DIAGNOSIS — I10 Essential (primary) hypertension: Secondary | ICD-10-CM | POA: Insufficient documentation

## 2017-01-30 DIAGNOSIS — I480 Paroxysmal atrial fibrillation: Secondary | ICD-10-CM | POA: Insufficient documentation

## 2017-01-30 DIAGNOSIS — Z87442 Personal history of urinary calculi: Secondary | ICD-10-CM | POA: Insufficient documentation

## 2017-01-30 DIAGNOSIS — Z7901 Long term (current) use of anticoagulants: Secondary | ICD-10-CM | POA: Diagnosis not present

## 2017-01-30 DIAGNOSIS — Z803 Family history of malignant neoplasm of breast: Secondary | ICD-10-CM | POA: Diagnosis not present

## 2017-01-30 DIAGNOSIS — Z9889 Other specified postprocedural states: Secondary | ICD-10-CM | POA: Diagnosis not present

## 2017-01-30 DIAGNOSIS — Z79899 Other long term (current) drug therapy: Secondary | ICD-10-CM | POA: Diagnosis not present

## 2017-01-30 DIAGNOSIS — H3530 Unspecified macular degeneration: Secondary | ICD-10-CM | POA: Diagnosis not present

## 2017-01-30 DIAGNOSIS — Z90710 Acquired absence of both cervix and uterus: Secondary | ICD-10-CM | POA: Diagnosis not present

## 2017-01-30 DIAGNOSIS — Z7984 Long term (current) use of oral hypoglycemic drugs: Secondary | ICD-10-CM | POA: Insufficient documentation

## 2017-01-30 DIAGNOSIS — E781 Pure hyperglyceridemia: Secondary | ICD-10-CM | POA: Insufficient documentation

## 2017-01-30 DIAGNOSIS — Z8249 Family history of ischemic heart disease and other diseases of the circulatory system: Secondary | ICD-10-CM | POA: Insufficient documentation

## 2017-01-30 DIAGNOSIS — Z1231 Encounter for screening mammogram for malignant neoplasm of breast: Secondary | ICD-10-CM | POA: Diagnosis not present

## 2017-01-30 DIAGNOSIS — Z87891 Personal history of nicotine dependence: Secondary | ICD-10-CM | POA: Diagnosis not present

## 2017-01-30 DIAGNOSIS — I447 Left bundle-branch block, unspecified: Secondary | ICD-10-CM | POA: Diagnosis not present

## 2017-01-30 DIAGNOSIS — E119 Type 2 diabetes mellitus without complications: Secondary | ICD-10-CM | POA: Insufficient documentation

## 2017-01-30 DIAGNOSIS — K219 Gastro-esophageal reflux disease without esophagitis: Secondary | ICD-10-CM | POA: Insufficient documentation

## 2017-01-30 DIAGNOSIS — Z888 Allergy status to other drugs, medicaments and biological substances status: Secondary | ICD-10-CM | POA: Insufficient documentation

## 2017-01-30 DIAGNOSIS — K449 Diaphragmatic hernia without obstruction or gangrene: Secondary | ICD-10-CM | POA: Diagnosis not present

## 2017-01-30 LAB — HM MAMMOGRAPHY

## 2017-01-30 NOTE — Progress Notes (Signed)
Patient ID: Isabella Wright, female   DOB: Mar 12, 1946, 71 y.o.   MRN: [id number]     Primary Care Physician: Curly Rim, MD Referring Physician: Dr. Jena Gauss Isabella Wright is a 71 y.o. female with a h/o DM, HTN, hypertriglyceridemia, LBBB,previuosly worked up in [place], [place], episodes of tachycardia for many years, but dx as afib 12/2015. She is currently wearing an event monitor. She has had strips showing afib with rvr up to 150-170 bpm. Longest episode reported in EPIC lastest 30 mins. Pt states she was unaware of having afib when the monitor company called to check on her. She is on xarelto with  a chadsvasc score of at least 4. 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 He had previously been on amiodarone, which made him very ill, and tikosyn which controlled the afib and he tolerated well, until it was stopped due to long QT.  She denies tobacco abuse, alcohol use, minimal caffeine. Snores some but does not think to a significant degree. Exercises on a regular basis.Has been on Inderal for years, uses more prn than daily. Is also on diltiazem. Metoprolol daily added.  Returns to afib clinic, 8/4. Event monitor finished and showed 55% afib burden. She is in afib today with rvr. Some days she feels well and other days, she feels short of breath with activities. She enjoys golfing but has refrained this summer for not feeling like she has the energy to play. Recent echo showed normal EF with mildly dilated  left atrium. She had a stress test which was low risk.  F/u 9/8, after consult with Dr. Rayann Heman, she was admitted for tikosyn. She was  loaded on tikosyn and left the hospital on 375 mg bid in SR. Unfortunately, she is in afib today in the afib clininc. She felt really good for the first few days but for the last two days has  felt very poorly with fatigue.  Returns 9/14, she is in [place], but feels that she was in afib most of the am. Has had good days and bad days since [name].  She feels that [name] is not quite doing the job of keeping in [place], and ablation is discussed, which she is interested in and will discuss further with Dr. Rayann Heman 9/27.  Returns to afib clinic 11/27, one month after ablation and is elated that she feels so well. She did have some afib right after the procedure but has not had any for weeks. She has so much energy. No swallowing or groin issues. Continues on tikosyn and xarelto.  F/u in afib clinic 7/16. She feels well.She saw Dr.Allred in April and she was doing well with no afib, with subsequent  reduction in cardizem. She continues to be doing well staying in SR. Continues on xarelto , no bleeding issues.  Today, she denies symptoms of palpitations, chest pain, orthopnea, PND, lower extremity edema, dizziness, presyncope, syncope, or neurologic sequela. Positive for fatigue and shortness of breath at times. The patient is tolerating medications without difficulties and is otherwise without complaint today.   Past Medical History:  Diagnosis Date  . Dermatofibrosarcoma 11/03/13   dermatofibro sarcoma protuberens left groin  . Diabetes mellitus type 2, controlled ([place]) 11-04-2010  . GERD (gastroesophageal reflux disease)   . History of hiatal hernia   .  Hypertension 2000   2009: Renal Dopplers showed R RA ostial stenosis -- (told not a problem or 20 yrs)  . Hypertriglyceridemia 1996   Very labile and difficult control: Began in '96 when she began taking Premarin after hysterectomy -- triglycerides increased to 3582 neuropathy in her feet> care for by endocrinologis; levels vary based on stress.;; Labs from March 2015: TC 150, TG 745, HDL 26, LDL 45  . Kidney stones   . LBBB (left bundle branch block) 2009   Initially diagnosed as rate related during stress echo.  . Macular degeneration    "dry in  right; treating like wet in the left" (03/15/2016)  . New onset atrial fibrillation ([place]) 01/04/2016   This patients CHA2DS2-VASc Score and unadjusted Ischemic Stroke Rate (% per year) is equal to 4.8 % stroke rate/year from a score of 4 -- 1 point each: HTN, DM, Age if 80-74, or Female]     . Paroxysmal SVT (supraventricular tachycardia) ([place]) 2009   less frequent following I&D of chest wall abscess (Jan 015), not previously documented, may have been afib.  . Sarcoma ([place]) 2015   dermatofibro sarcoma protuberens left groin   Past Surgical History:  Procedure Laterality Date  . Abdominal Doppler ultrasound  February 2009    Right ostial renal artery stenosis; normal renal structure.  . APPENDECTOMY  1968  . CYSTOSCOPY W/ URETEROSCOPY W/ LITHOTRIPSY   1999   Large calcium oxalate stone  . CYSTOSCOPY/RETROGRADE/URETEROSCOPY/STONE EXTRACTION WITH BASKET  1972  . ELECTROPHYSIOLOGIC STUDY N/A 05/13/2016   Procedure: Atrial Fibrillation Ablation;  Surgeon: Thompson Grayer, MD;  Location: [facility];  Service: Cardiovascular;  Laterality: N/A;  . EXCISIONAL HEMORRHOIDECTOMY   1981  . FRACTURE SURGERY    . IRRIGATION AND DEBRIDEMENT SEBACEOUS CYST  January 2015    chest wall; with antibiotics --> following this treatment, SVT episodes became much less frequent.  . ORIF PROXIMAL TIBIAL PLATEAU FRACTURE Left 2007   , Related shattered left tibial plateau with reattachment of torn ligaments and insertion of titanium plate and screws.  Marland Kitchen RENAL ARTERY DUPLEX  01/02/2014   R&L RA - mildly elevated velocities - < 60%; Bilateral Kidneys - normal shape & size.  Marland Kitchen SARCOMA EXCISION Left 2015   dermatofibro sarcoma protuberens, wide excision of groin from front side to buttocks"  . SOFT TISSUE BIOPSY Left 2015  . TOTAL ABDOMINAL HYSTERECTOMY  1994  . TRANSTHORACIC ECHOCARDIOGRAM  2013   normal  . TRANSTHORACIC ECHOCARDIOGRAM  March 2015   aortic sclerosis; without stenosis. Normal EF  . TREADMILL  STRESS ECHO  01/2009   No regional wall motion abnormalities with stress == non-ischemic; rate related incomplete left bundle branch block    Current Outpatient Prescriptions  Medication Sig Dispense Refill  . ACCU-CHEK AVIVA PLUS test strip 1 EACH BY OTHER ROUTE 4 (FOUR) TIMES DAILY. USE AS INSTRUCTED DIAG E11.65  5  . ACCU-CHEK SOFTCLIX LANCETS lancets 1 EACH BY OTHER ROUTE 3 (THREE) TIMES A DAY.  5  . acetaminophen (TYLENOL) 325 MG tablet Take 2 tablets (650 mg total) by mouth every 4 (four) hours as needed for headache or mild pain.    . cholecalciferol (VITAMIN D) 1000 UNITS tablet Take 2,000 Units by mouth daily.     . clonazePAM (KLONOPIN) 0.5 MG tablet Take 0.25-0.5 mg by mouth at bedtime as needed for anxiety (sleep).   0  . diltiazem (CARDIZEM CD) 120 MG 24 hr capsule Take 2 capsules (240 mg total) by mouth  daily. 180 capsule 3  . Dulaglutide (TRULICITY) 4.09 BD/5.3GD SOPN Inject 0.75 mg into the skin once a week. Thursday    . fenofibrate 160 MG tablet Take 160 mg by mouth daily.     . fluticasone (FLONASE) 50 MCG/ACT nasal spray Place 1-2 sprays into both nostrils at bedtime as needed for allergies or rhinitis.    Marland Kitchen glimepiride (AMARYL) 4 MG tablet Take 4 mg by mouth daily.   5  . HYDROcodone-acetaminophen (NORCO/VICODIN) 5-325 MG tablet Take 1-2 tablets by mouth every 6 (six) hours as needed for severe pain. (Patient not taking: Reported on 01/09/2017) 15 tablet 0  . Insulin Aspart (NOVOLOG FLEXPEN [place]) Inject 10 Units into the skin 3 (three) times daily before meals. If blood sugar is higher than 120 give 10 units before each meal    . Insulin Detemir (LEVEMIR FLEXTOUCH) 100 UNIT/ML Pen Inject 50 Units into the skin at bedtime.     . metFORMIN (GLUCOPHAGE-XR) 500 MG 24 hr tablet Take 1 tablet (500 mg total) by mouth daily with breakfast. 1000 MG TAKE IN THE EVENING    . metoprolol tartrate (LOPRESSOR) 25 MG tablet Take 1 tablet by mouth every 6 hours as needed for fast heart rates 180  tablet 3  . Multiple Vitamins-Minerals (PRESERVISION AREDS 2 PO) Take 1 tablet by mouth 2 (two) times daily.     Marland Kitchen omega-3 acid ethyl esters (LOVAZA) 1 G capsule Take 2 g by mouth 2 (two) times daily.     Marland Kitchen omeprazole (PRILOSEC OTC) 20 MG tablet Take 20 mg by mouth daily.    . pravastatin (PRAVACHOL) 20 MG tablet Take 20 mg by mouth daily.    . ramipril (ALTACE) 10 MG capsule Take 10 mg by mouth daily.     . rosuvastatin (CRESTOR) 10 MG tablet Take 10 mg by mouth daily.    . rosuvastatin (CRESTOR) 10 MG tablet TAKE 1 TABLET (10 MG TOTAL) BY MOUTH DAILY IN THE EVENING 30 tablet 1  . XARELTO 20 MG TABS tablet TAKE 1 TABLET (20 MG TOTAL) BY MOUTH DAILY WITH SUPPER. 90 tablet 1   No current facility-administered medications for this encounter.     Allergies  Allergen Reactions  . Fluorescein Anaphylaxis    Pt states she had swelling of facial tissue with SOB and itching with a rapid heart rate.  . Exenatide Nausea And Vomiting  . Liraglutide Nausea And Vomiting  . Demerol [Meperidine] Nausea And Vomiting    Social History   Social History  . Marital status: Widowed    Spouse name: N/A  . Number of children: N/A  . Years of education: N/A   Occupational History  . Not on file.   Social History Main Topics  . Smoking status: Former Smoker    Packs/day: 0.50    Years: 15.00    Types: Cigarettes  . 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 Alcohol use No  . Drug use: No  . Sexual activity: Not Currently   Other Topics Concern  . 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 She is now widowed for just under 2 years. Her husband died of cancer. They do not have any children.   She recently (spring of 2015) moved to [place] to be closer to her brother, [name] and  his family.   She been the caregiver for her ailing husband for 5  years. He passed away in 2012/04/08, and she has had prolonged period of grieving partly due to her having lost 3 members of her immediate family during that time period as well.  --- She is under a lot of stress      She is a former smoker quit years ago. She does not drink alcohol. She is active, but not routine exercise. She says that she tries to eat healthy and knows what usually affects her triglyceride levels.    Family History  Problem Relation Age of Onset  . Heart disease Brother        Died at 84  . Heart disease Mother        Died at 39  . Heart disease Father        Died at 54  . Breast cancer Sister        Died at 43    ROS- All systems are reviewed and negative except as per the HPI above  Physical Exam: There were no vitals filed for this visit.  GEN- The patient is well appearing, alert and oriented x 3 today.   Head- normocephalic, atraumatic Eyes-  Sclera clear, conjunctiva pink Ears- hearing intact Oropharynx- clear Neck- supple, no JVP Lymph- no cervical lymphadenopathy Lungs- Clear to ausculation bilaterally, normal work of breathing Heart-regular rate and rhythm, no murmurs, rubs or gallops, PMI not laterally displaced GI- soft, NT, ND, + BS Extremities- no clubbing, cyanosis, or edema MS- no significant deformity or atrophy Skin- no rash or lesion Psych- euthymic mood, full affect Neuro- strength and sensation are intact  EKG-  SR at 85 bpm, LAD, LBBB, pr int 162 ms, qrs int 134 ms, qtc 497 ms Epic records reviewed  ECHO- 12/2015-Left ventricle: The cavity size was normal. Wall thickness was  increased in a pattern of mild LVH. Systolic function was low  normal to mildly reduced. The estimated ejection fraction was in  the range of 50% to 55%. Septal-lateral dyssynchrony.  Indeterminant diastolic function (atrial fibrillation). - Aortic valve: There was no stenosis. - Mitral valve: There was trivial regurgitation. - Left atrium: The atrium  was mildly dilated. - Right ventricle: The cavity size was normal. Systolic function  was normal. - Tricuspid valve: Peak RV-RA gradient (S): 25 mm Hg. - Pulmonary arteries: PA peak pressure: 28 mm Hg (S). - Inferior vena cava: The vessel was normal in size. The  respirophasic diameter changes were in the normal range (>= 50%),  consistent with normal central venous pressure.     Assessment and Plan: 1. Paroxysmal afib S/p ablation 04/2016 and staying in SR Off tikosyn Continue Cardizem at 120 mg a day and metoprolol at one bid Continue xaretlo for a chadsvasc score of at least 4.  F/u afib clinic as needed Dr. Rayann Heman in 3 months   Geroge Baseman. Peregrine Nolt, [facility] [address] [place], [place] [address] [phone]

## 2017-02-07 ENCOUNTER — Telehealth: Payer: Self-pay | Admitting: Cardiology

## 2017-02-07 DIAGNOSIS — Z7901 Long term (current) use of anticoagulants: Secondary | ICD-10-CM | POA: Diagnosis not present

## 2017-02-07 DIAGNOSIS — L720 Epidermal cyst: Secondary | ICD-10-CM | POA: Diagnosis not present

## 2017-02-07 NOTE — Telephone Encounter (Signed)
° °  [facility] Pre-operative Risk Assessment    Request for surgical clearance:  1. What type of surgery is being performed?Cyst incision   When is this surgery scheduled? Pending   Are there any medications that need to be held prior to surgery and how long?Xarelto for 2 days prior procedure   Name of physician performing surgery?Dr.George Kellie Simmering  What is your office phone and fax number?Fax# [phone]  Romana Juniper 02/07/2017, 4:16 PM  _________________________________________________________________   (provider comments below)

## 2017-02-07 NOTE — Telephone Encounter (Signed)
OK to hold x 48 hr.  Isabella Wright c

## 2017-02-08 ENCOUNTER — Telehealth (HOSPITAL_COMMUNITY): Payer: Self-pay | Admitting: *Deleted

## 2017-02-08 MED ORDER — HYDROCHLOROTHIAZIDE 25 MG PO TABS: 25.0000 mg | ORAL_TABLET | Freq: Every day | ORAL | 3 refills | Status: DC

## 2017-02-08 NOTE — Telephone Encounter (Signed)
Faxed via EPIC

## 2017-02-08 NOTE — Telephone Encounter (Signed)
Pt called stating since she is now off tikosyn should she go back on HCTZ (which was stopped due to interaction with tikosyn).  Discussed with Roderic Palau NP pt may restart  HCTZ at previous dose of 25mg  daily. Her BP at last office visit was 146/80. She will have her BMET drawn at her endocrinologist office in about 3 weeks. Pt will call if further issues.

## 2017-02-16 DIAGNOSIS — L720 Epidermal cyst: Secondary | ICD-10-CM | POA: Diagnosis not present

## 2017-02-16 DIAGNOSIS — R238 Other skin changes: Secondary | ICD-10-CM | POA: Diagnosis not present

## 2017-02-20 DIAGNOSIS — L729 Follicular cyst of the skin and subcutaneous tissue, unspecified: Secondary | ICD-10-CM | POA: Diagnosis not present

## 2017-03-06 ENCOUNTER — Telehealth: Payer: Self-pay | Admitting: Cardiology

## 2017-03-06 ENCOUNTER — Encounter: Payer: Self-pay | Admitting: Student

## 2017-03-06 ENCOUNTER — Ambulatory Visit (INDEPENDENT_AMBULATORY_CARE_PROVIDER_SITE_OTHER): Payer: Medicare Other | Admitting: Student

## 2017-03-06 ENCOUNTER — Telehealth (HOSPITAL_COMMUNITY): Payer: Self-pay | Admitting: *Deleted

## 2017-03-06 VITALS — BP 132/72 | HR 80 | Ht 63.5 in | Wt 143.0 lb

## 2017-03-06 DIAGNOSIS — I480 Paroxysmal atrial fibrillation: Secondary | ICD-10-CM

## 2017-03-06 DIAGNOSIS — I701 Atherosclerosis of renal artery: Secondary | ICD-10-CM | POA: Diagnosis not present

## 2017-03-06 DIAGNOSIS — Z7901 Long term (current) use of anticoagulants: Secondary | ICD-10-CM

## 2017-03-06 DIAGNOSIS — I10 Essential (primary) hypertension: Secondary | ICD-10-CM | POA: Diagnosis not present

## 2017-03-06 DIAGNOSIS — E782 Mixed hyperlipidemia: Secondary | ICD-10-CM

## 2017-03-06 MED ORDER — ROSUVASTATIN CALCIUM 10 MG PO TABS: 10.0000 mg | ORAL_TABLET | Freq: Every day | ORAL | 2 refills | Status: DC

## 2017-03-06 MED ORDER — DILTIAZEM HCL ER COATED BEADS 120 MG PO CP24: 120.0000 mg | ORAL_CAPSULE | Freq: Two times a day (BID) | ORAL | 3 refills | Status: DC

## 2017-03-06 NOTE — Telephone Encounter (Signed)
P says her blood pressure have been high over the weekend,it was 200 and 210 over 100.Pt offered her an appt for Thursday,she said she would like to bee seen before Thursday.please.

## 2017-03-06 NOTE — Telephone Encounter (Signed)
SPOKE TO PATIENT APPT SCHEDULE FOR TODAY AT 11:30 W /B. Ahmed Prima

## 2017-03-06 NOTE — Telephone Encounter (Signed)
Pt cld reporting that she has had high blood pressures over the last few days.  Sat 210/110, Sunday 200/100 feeling shaky and uneasy and today in the 798X systolic.  Pt wanted Ceasar Lund advice on what she should do with her medications.  Butch Penny felt it necessary for pt to be seen and pt had already scheduled with Bernerd Pho, NP today.

## 2017-03-06 NOTE — Progress Notes (Signed)
Cardiology Office Note    Date:  03/06/2017   ID:  Isabella Wright, DOB 1946/05/18, MRN [id number]  PCP:  Curly Rim, MD  Cardiologist: Dr. Ellyn Hack   Chief Complaint  Patient presents with  . Follow-up    discuss blood pressure issues, dizziness    History of Present Illness:    Isabella Wright is a 72 y.o. female with past medical history of PAF (s/p ablation in 04/2016), HTN, HLD, and Type 2 DM who presents to the office today for evaluation of elevated BP.   She was last examined by Roderic Palau, NP in 01/2017 for follow-up for her atrial fibrillation and was doing well at that time, maintaining NSR. She denied any recent palpitations or chest discomfort. She was continued on Ramipril 10mg  daily and Cardizem CD 240mg  daily along with Xarelto 20mg  daily for anticoagulation.   She called the office on 02/08/2017 and BP was at 146/80, therefore she was restarted on HCTZ 25mg  daily. She called the office today reporting systolic readings in the 409'W, therefore she was added on for an acute visit.   In talking with the patient, she reports elevated blood pressure readings over this past weekend. Reports systolic readings were in the low 119'J with diastolics in the 47'W upon waking up. She took her medications and systolic readings improved to 160's several hours afterwards. She notes associated dizziness and headaches with elevated blood pressure. No weakness or vision changes. No recent chest pain, dyspnea, or palpitations. She reports good compliance with her medication regimen and denies missing any recent doses. She did take an extra Lopressor tablet when blood pressure was elevated and reports this helps significantly. At the time of today's visit, her blood pressure is well controlled at 132/72.  She did stop her Klonopin approximately one week ago. She weaned this down from 0.5 mg BID to 0.25mg  BID then stopped it after being on the reduced dosing for one week. Since stopping the  medication she has experienced difficulty sleeping. Has follow-up with her PCP later this week.    Past Medical History:  Diagnosis Date  . 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 Diabetes mellitus type 2, controlled ([place]) 2012  . GERD (gastroesophageal reflux disease)   . History of hiatal hernia   . Hypertension 2000   2009: Renal Dopplers showed R RA ostial stenosis -- (told not a problem or 20 yrs)  . Hypertriglyceridemia 1996   Very labile and difficult control: Began in '96 when she began taking Premarin after hysterectomy -- triglycerides increased to 3582 neuropathy in her feet> care for by endocrinologis; levels vary based on stress.;; Labs from March 2015: TC 150, TG 745, HDL 26, LDL 45  . Kidney stones   . LBBB (left bundle branch block) 2009   Initially diagnosed as rate related during stress echo.  . Macular degeneration    "dry in right; treating like wet in the left" (03/15/2016)  . New onset atrial fibrillation ([place]) 01/04/2016   a. s/p ablation in 04/2016  . Paroxysmal SVT (supraventricular tachycardia) ([place]) 2009   less frequent following I&D of chest wall abscess (Jan 015), not previously documented, may have been afib.  . Sarcoma ([place]) 2015   dermatofibro sarcoma protuberens left groin    Past Surgical History:  Procedure Laterality Date  . Abdominal Doppler ultrasound  February 2009    Right ostial renal artery stenosis; normal renal structure.  . APPENDECTOMY  1968  .  CYSTOSCOPY W/ URETEROSCOPY W/ LITHOTRIPSY   1999   Large calcium oxalate stone  . CYSTOSCOPY/RETROGRADE/URETEROSCOPY/STONE EXTRACTION WITH BASKET  1972  . ELECTROPHYSIOLOGIC STUDY N/A 05/13/2016   Procedure: Atrial Fibrillation Ablation;  Surgeon: Thompson Grayer, MD;  Location: [facility];  Service: Cardiovascular;  Laterality: N/A;  . EXCISIONAL HEMORRHOIDECTOMY   1981  . FRACTURE SURGERY    . IRRIGATION AND DEBRIDEMENT SEBACEOUS CYST  January 2015     chest wall; with antibiotics --> following this treatment, SVT episodes became much less frequent.  . ORIF PROXIMAL TIBIAL PLATEAU FRACTURE Left 2007   , Related shattered left tibial plateau with reattachment of torn ligaments and insertion of titanium plate and screws.  Marland Kitchen RENAL ARTERY DUPLEX  01/02/2014   R&L RA - mildly elevated velocities - < 60%; Bilateral Kidneys - normal shape & size.  Marland Kitchen SARCOMA EXCISION Left 2015   dermatofibro sarcoma protuberens, wide excision of groin from front side to buttocks"  . SOFT TISSUE BIOPSY Left 2015  . TOTAL ABDOMINAL HYSTERECTOMY  1994  . TRANSTHORACIC ECHOCARDIOGRAM  2013   normal  . TRANSTHORACIC ECHOCARDIOGRAM  March 2015   aortic sclerosis; without stenosis. Normal EF  . TREADMILL STRESS ECHO  01/2009   No regional wall motion abnormalities with stress == non-ischemic; rate related incomplete left bundle branch block    Current Medications: Outpatient Medications Prior to Visit  Medication Sig Dispense Refill  . ACCU-CHEK AVIVA PLUS test strip 1 EACH BY OTHER ROUTE 4 (FOUR) TIMES DAILY. USE AS INSTRUCTED DIAG E11.65  5  . ACCU-CHEK SOFTCLIX LANCETS lancets 1 EACH BY OTHER ROUTE 3 (THREE) TIMES A DAY.  5  . acetaminophen (TYLENOL) 325 MG tablet Take 2 tablets (650 mg total) by mouth every 4 (four) hours as needed for headache or mild pain.    . cholecalciferol (VITAMIN D) 1000 UNITS tablet Take 2,000 Units by mouth daily.     . Dulaglutide (TRULICITY) 2.37 SE/8.3TD SOPN Inject 0.75 mg into the skin once a week. Thursday    . fenofibrate 160 MG tablet Take 160 mg by mouth daily.     . fluticasone (FLONASE) 50 MCG/ACT nasal spray Place 1-2 sprays into both nostrils at bedtime as needed for allergies or rhinitis.    Marland Kitchen glimepiride (AMARYL) 4 MG tablet Take 4 mg by mouth daily.   5  . hydrochlorothiazide (HYDRODIURIL) 25 MG tablet Take 1 tablet (25 mg total) by mouth daily. 90 tablet 3  . Insulin Aspart (NOVOLOG FLEXPEN [place]) Inject 10 Units into the  skin 3 (three) times daily before meals. If blood sugar is higher than 120 give 10 units before each meal    . Insulin Detemir (LEVEMIR FLEXTOUCH) 100 UNIT/ML Pen Inject 50 Units into the skin at bedtime.     . metFORMIN (GLUCOPHAGE-XR) 500 MG 24 hr tablet Take 1 tablet (500 mg total) by mouth daily with breakfast. 1000 MG TAKE IN THE EVENING    . metoprolol tartrate (LOPRESSOR) 25 MG tablet Take 1 tablet by mouth every 6 hours as needed for fast heart rates 180 tablet 3  . Multiple Vitamins-Minerals (PRESERVISION AREDS 2 PO) Take 1 tablet by mouth 2 (two) times daily.     Marland Kitchen omega-3 acid ethyl esters (LOVAZA) 1 G capsule Take 2 g by mouth 2 (two) times daily.     Marland Kitchen omeprazole (PRILOSEC OTC) 20 MG tablet Take 20 mg by mouth daily.    . ramipril (ALTACE) 10 MG capsule Take 10 mg by  mouth daily.     Alveda Reasons 20 MG TABS tablet TAKE 1 TABLET (20 MG TOTAL) BY MOUTH DAILY WITH SUPPER. 90 tablet 1  . diltiazem (CARDIZEM CD) 120 MG 24 hr capsule Take 2 capsules (240 mg total) by mouth daily. 180 capsule 3  . rosuvastatin (CRESTOR) 10 MG tablet TAKE 1 TABLET (10 MG TOTAL) BY MOUTH DAILY IN THE EVENING 30 tablet 1  . clonazePAM (KLONOPIN) 0.5 MG tablet Take 0.25-0.5 mg by mouth at bedtime as needed for anxiety (sleep).   0   No facility-administered medications prior to visit.      Allergies:   Fluorescein; Exenatide; Liraglutide; and Demerol [meperidine]   Social History   Social History  . Marital status: Widowed    Spouse name: N/A  . Number of children: N/A  . Years of education: N/A   Social History Main Topics  . Smoking status: Former Smoker    Packs/day: 0.50    Years: 15.00    Types: Cigarettes  . 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 Alcohol use No  . Drug use: No  . Sexual activity: Not Currently   Other Topics Concern  . 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 She is now widowed for just under 2 years. Her husband died of cancer. They do not have any children.   She recently (spring of 2015) moved to [place] to be closer to her brother, [name] and his family.   She been the caregiver for her ailing husband for 5 years. He passed away in March 31, 2012, and she has had prolonged period of grieving partly due to her having lost 3 members of her immediate family during that time period as well.  --- She is under a lot of stress      She is a former smoker quit years ago. She does not drink alcohol. She is active, but not routine exercise. She says that she tries to eat healthy and knows what usually affects her triglyceride levels.     Family History:  The patient's family history includes Breast cancer in her sister; Heart disease in her brother, father, and mother.   Review of Systems:   Please see the history of present illness.     General:  No chills, fever, night sweats or weight changes.  Cardiovascular:  No chest pain, dyspnea on exertion, edema, orthopnea, palpitations, paroxysmal nocturnal dyspnea. Dermatological: No rash, lesions/masses Respiratory: No cough, dyspnea Urologic: No hematuria, dysuria Abdominal:   No nausea, vomiting, diarrhea, bright red blood per rectum, melena, or hematemesis Neurologic:  No visual changes, wkns, changes in mental status. Positive for headaches and dizziness.   All other systems reviewed and are otherwise negative except as noted above.   Physical Exam:    VS:  BP 132/72   Pulse 80   Ht 5' 3.5" (1.613 m)   Wt 143 lb (64.9 kg)   BMI 24.93 kg/m    General: Well developed, well nourished Caucasian female appearing in no acute distress. Head: Normocephalic, atraumatic, sclera non-icteric, no xanthomas, nares are without discharge.  Neck: No carotid bruits. JVD not elevated.  Lungs: Respirations regular and unlabored, without wheezes or rales.  Heart: Regular rate and  rhythm. No S3 or S4.  No murmur, no rubs, or gallops appreciated. Abdomen: Soft,  non-tender, non-distended with normoactive bowel sounds. No hepatomegaly. No rebound/guarding. No obvious abdominal masses. Msk:  Strength and tone appear normal for age. No joint deformities or effusions. Extremities: No clubbing or cyanosis. No lower extremity edema.  Distal pedal pulses are 2+ bilaterally. Neuro: Alert and oriented X 3. Moves all extremities spontaneously. No focal deficits noted. Psych:  Responds to questions appropriately with a normal affect. Skin: No rashes or lesions noted  Wt Readings from Last 3 Encounters:  03/06/17 143 lb (64.9 kg)  01/30/17 141 lb 12.8 oz (64.3 kg)  11/07/16 146 lb 3.2 oz (66.3 kg)     Studies/Labs Reviewed:   EKG:  EKG is not ordered today.   Recent Labs: 03/25/2016: Magnesium 1.9 05/02/2016: BUN 21; Creat 0.87; Hemoglobin 13.8; Platelets 163; Potassium 4.0; Sodium 141   Lipid Panel    Component Value Date/Time   CHOL 149 12/30/2013 0807   TRIG 456 (H) 12/30/2013 0807   HDL 31 (L) 12/30/2013 0807   LDLCALC NOT CALC 12/30/2013 0807    Additional studies/ records that were reviewed today include:   Echocardiogram: 12/2015 Study Conclusions  - Left ventricle: The cavity size was normal. Wall thickness was   increased in a pattern of mild LVH. Systolic function was low   normal to mildly reduced. The estimated ejection fraction was in   the range of 50% to 55%. Septal-lateral dyssynchrony.   Indeterminant diastolic function (atrial fibrillation). - Aortic valve: There was no stenosis. - Mitral valve: There was trivial regurgitation. - Left atrium: The atrium was mildly dilated. - Right ventricle: The cavity size was normal. Systolic function   was normal. - Tricuspid valve: Peak RV-RA gradient (S): 25 mm Hg. - Pulmonary arteries: PA peak pressure: 28 mm Hg (S). - Inferior vena cava: The vessel was normal in size. The   respirophasic diameter  changes were in the normal range (>= 50%),   consistent with normal central venous pressure.  Impressions:  - Normal LV size with mild LV hypertrophy. EF 50-55%,   septal-lateral dyssynchrony. Normal RV size and systolic   function. No significant valvular abnormalities.  Atrial Fibrillation Ablation: 04/2016 CONCLUSIONS: 1. Sinus rhythm upon presentation.   2. ICE reveals a moderate sized left atrium with four separate pulmonary veins without evidence of pulmonary vein stenosis. 3. Successful electrical isolation and anatomical encircling of all four pulmonary veins with radiofrequency current.  There was prodigious conduction within the PVs.  The RSPV was likely a culprit for afib. 4. No inducible arrhythmias following ablation both on and off of Isuprel. 5. No evidence of dual AV nodal physiology or accessory pathways. 6. No early apparent complications.   Assessment:    1. Paroxysmal atrial fibrillation (HCC)   2. Long term current use of anticoagulant   3. Essential hypertension   4. Hyperlipemia, mixed   5. Renal artery stenosis (HCC)      Plan:   In order of problems listed above:  1. Paroxysmal Atrial Fibrillation/ Use of Long-term anticoagulation - she has a known history of atrial fibrillation, having undergone successful ablation in 04/2016 with no known recurrence since. She denies any recent chest pain or palpitations. - continue Cardizem CD 120mg  BID (currently taking two tablets in the morning --> will spread out to 1 tablet in AM and 1 tablet in PM to assist with labile BP readings).  - This patients CHA2DS2-VASc Score and unadjusted Ischemic Stroke Rate (% per year) is equal to 4.8 % stroke rate/year from a score  of 4 (HTN, DM, Female, Age). She denies any evidence of active bleeding. Continue Xarelto 20mg  daily for anticoagulation.   2. HTN - BP was elevated with systolic readings in the low 924'M with diastolics in the 62'M over the weekend, improving to  the 160's/80's after taking her BP medications. She recently stopped her Klonopin approximately one week ago and has experienced difficulty sleeping along with worsening anxiety which is likely exacerbating her BP readings. She has follow-up with her PCP later this week regarding her anxiety and sleep habits. - BP is well-controlled at 132/72 during today's visit. - I recommended she space out her Cardizem CD dosing to 120mg  in the AM and 120mg  in the PM as her morning readings seem to be the highest. She will continue on HCTZ 25mg  daily and Ramipril 10mg  daily. She will check her BP regularly at home and if readings remain elevated, would recommend titration of her Ramipril (max dose of 20mg  daily).   3. HLD - Lipid Panel in 11/2016 showed total cholesterol of 105 and Triglycerides of 647. Known history of hypertriglyceridemia. LDL unable to be calculated secondary to triglyceride level.  - continue Crestor 10mg  daily.   4. Renal Artery Stenosis - dopplers in 02/2015 showed 1-59%  right renal artery origin and proximal segment stenosis, based on PSV and RAR. - consider repeat studies if BP remains difficult to control.    Medication Adjustments/Labs and Tests Ordered: Current medicines are reviewed at length with the patient today.  Concerns regarding medicines are outlined above.  Medication changes, Labs and Tests ordered today are listed in the Patient Instructions below.  Patient Instructions  Medication Instructions:  CHANGE CARDIZEM TO 120MG  TWICE DAILY If you need a refill on your cardiac medications before your next appointment, please call your pharmacy.  Follow-Up: Your physician wants you to follow-up in: 3 MONTHS WITH DR HARDING(CANCEL APPT Thursday).   Special Instructions: TAKE BP AND CALL WITH NUMBERS Thursday OR Friday FOR Read Bonelli.  Thank you for choosing [facility] at [facility], Erma Heritage, PA-C  03/06/2017 2:48 PM    [address], [place], [place]  [address] Phone: [phone]; Fax: [phone]  [address] [place] [address] Phone: [phone]

## 2017-03-06 NOTE — Patient Instructions (Signed)
Medication Instructions:  CHANGE CARDIZEM TO 120MG  TWICE DAILY If you need a refill on your cardiac medications before your next appointment, please call your pharmacy.  Follow-Up: Your physician wants you to follow-up in: 3 MONTHS WITH DR HARDING(CANCEL APPT Thursday).   Special Instructions: TAKE BP AND CALL WITH NUMBERS Thursday OR Friday FOR BRITTANY.  Thank you for choosing [facility] at [facility]!!

## 2017-03-08 DIAGNOSIS — E1137X3 Type 2 diabetes mellitus with diabetic macular edema, resolved following treatment, bilateral: Secondary | ICD-10-CM | POA: Diagnosis not present

## 2017-03-08 DIAGNOSIS — R42 Dizziness and giddiness: Secondary | ICD-10-CM | POA: Diagnosis not present

## 2017-03-08 DIAGNOSIS — I701 Atherosclerosis of renal artery: Secondary | ICD-10-CM | POA: Diagnosis not present

## 2017-03-08 DIAGNOSIS — F5101 Primary insomnia: Secondary | ICD-10-CM | POA: Diagnosis not present

## 2017-03-08 DIAGNOSIS — I10 Essential (primary) hypertension: Secondary | ICD-10-CM | POA: Diagnosis not present

## 2017-03-08 DIAGNOSIS — E782 Mixed hyperlipidemia: Secondary | ICD-10-CM | POA: Diagnosis not present

## 2017-03-09 ENCOUNTER — Ambulatory Visit: Payer: Medicare Other | Admitting: Cardiology

## 2017-03-10 ENCOUNTER — Telehealth: Payer: Self-pay | Admitting: Cardiology

## 2017-03-10 NOTE — Telephone Encounter (Signed)
New message   Pt is calling to report her BP for the week.   170/88, 155/84, 160/90, 151/83, 142/77, 130/60, 160/92, 150/90, 135/74, 129/73, 137/73, 143/68, 147/85, 156/87, 132/76, 153/83, 158/95

## 2017-03-10 NOTE — Telephone Encounter (Signed)
Spoke with patient and she wanted to let [place] know her blood pressure readings. Also she wanted to let her know no elevated other episodes since weekend.  Will forward to [place] for review

## 2017-03-10 NOTE — Telephone Encounter (Signed)
PCP thought episode over weekend secondary to her weaning herself off Klonopin

## 2017-03-10 NOTE — Telephone Encounter (Signed)
Advised patient, verbalized understanding.  She will call back if she needs to increase Ramipril

## 2017-03-10 NOTE — Telephone Encounter (Signed)
Thank you for forwarding her readings. While she is not getting the peaks as she was previously experiencing, overall they still seem elevated with SBP in the 140's - 150's. I know we just recently adjusted the dosing so she can continue to trend these for a bit longer. If SBP remains > 140 and/or DBP > 90, would recommend increasing Ramipril to 10mg  BID (as the maximum dose is 20mg  daily). If she needs to start this, she will need a BMET within one week to make sure kidney function remains stable. Thank you!

## 2017-03-14 DIAGNOSIS — H2513 Age-related nuclear cataract, bilateral: Secondary | ICD-10-CM | POA: Diagnosis not present

## 2017-03-14 DIAGNOSIS — H353221 Exudative age-related macular degeneration, left eye, with active choroidal neovascularization: Secondary | ICD-10-CM | POA: Diagnosis not present

## 2017-03-14 DIAGNOSIS — H353114 Nonexudative age-related macular degeneration, right eye, advanced atrophic with subfoveal involvement: Secondary | ICD-10-CM | POA: Diagnosis not present

## 2017-03-21 DIAGNOSIS — Z794 Long term (current) use of insulin: Secondary | ICD-10-CM | POA: Diagnosis not present

## 2017-03-21 DIAGNOSIS — E1169 Type 2 diabetes mellitus with other specified complication: Secondary | ICD-10-CM | POA: Diagnosis not present

## 2017-03-21 DIAGNOSIS — E1165 Type 2 diabetes mellitus with hyperglycemia: Secondary | ICD-10-CM | POA: Diagnosis not present

## 2017-03-21 DIAGNOSIS — E782 Mixed hyperlipidemia: Secondary | ICD-10-CM | POA: Diagnosis not present

## 2017-03-21 DIAGNOSIS — I701 Atherosclerosis of renal artery: Secondary | ICD-10-CM | POA: Diagnosis not present

## 2017-03-21 DIAGNOSIS — I10 Essential (primary) hypertension: Secondary | ICD-10-CM | POA: Diagnosis not present

## 2017-04-01 ENCOUNTER — Other Ambulatory Visit: Payer: Self-pay | Admitting: Cardiology

## 2017-04-03 NOTE — Telephone Encounter (Signed)
rosuvastatin (CRESTOR) 10 MG tablet 90 tablet 2 03/06/2017    Sig - Route: Take 1 tablet (10 mg total) by mouth daily. - Oral   Sent to pharmacy as: rosuvastatin (CRESTOR) 10 MG tablet   Notes to Pharmacy: Need Dr Ellyn Hack f/u appt please call office to schedule   E-Prescribing Status: Receipt confirmed by pharmacy (03/06/2017 12:34 PM EDT)   Pharmacy   [id number] - [place], [place] - [address]

## 2017-05-08 ENCOUNTER — Ambulatory Visit (INDEPENDENT_AMBULATORY_CARE_PROVIDER_SITE_OTHER): Payer: Medicare Other | Admitting: Internal Medicine

## 2017-05-08 ENCOUNTER — Encounter: Payer: Self-pay | Admitting: Internal Medicine

## 2017-05-08 VITALS — BP 126/76 | HR 78 | Ht 63.25 in | Wt 145.3 lb

## 2017-05-08 DIAGNOSIS — I10 Essential (primary) hypertension: Secondary | ICD-10-CM | POA: Diagnosis not present

## 2017-05-08 DIAGNOSIS — I4819 Other persistent atrial fibrillation: Secondary | ICD-10-CM

## 2017-05-08 DIAGNOSIS — I481 Persistent atrial fibrillation: Secondary | ICD-10-CM | POA: Diagnosis not present

## 2017-05-08 DIAGNOSIS — I701 Atherosclerosis of renal artery: Secondary | ICD-10-CM

## 2017-05-08 NOTE — Patient Instructions (Signed)
Medication Instructions:  Your physician recommends that you continue on your current medications as directed. Please refer to the Current Medication list given to you today.  -- If you need a refill on your cardiac medications before your next appointment, please call your pharmacy. --  Labwork: None ordered  Testing/Procedures: None ordered  Follow-Up: Your physician wants you to follow-up in: 6 months with Roderic Palau and 12 months with Dr. Rayann Heman.  You will receive a reminder letter in the mail two months in advance. If you don't receive a letter, please call our office to schedule the follow-up appointment.  Thank you for choosing [facility]!!   Frederik Schmidt, RN [phone]  Any Other Special Instructions Will Be Listed Below (If Applicable).

## 2017-05-08 NOTE — Progress Notes (Signed)
PCP: Curly Rim, MD Primary Cardiologist: Dr Ellyn Hack Primary EP: Dr Kenn File Isabella Wright is a 71 y.o. female who presents today for routine electrophysiology followup.  Since last being seen in our clinic, the patient reports doing very well.  No afib post ablation.  She had elevated BP 03/06/17 while not taking klonopin.  She was evaluated by Bernerd Pho (her note reviewed) and she has had no further episodes since restarted klonopin.  I have encouraged her to discuss a slow long term potential wean of this medicine with her PCP.  Today, she denies symptoms of palpitations, chest pain, shortness of breath,  lower extremity edema, dizziness, presyncope, or syncope.  The patient is otherwise without complaint today.   Past Medical History:  Diagnosis Date  . 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 Diabetes mellitus type 2, controlled ([place]) 2012  . GERD (gastroesophageal reflux disease)   . History of hiatal hernia   . Hypertension 2000   2009: Renal Dopplers showed R RA ostial stenosis -- (told not a problem or 20 yrs)  . Hypertriglyceridemia 1996   Very labile and difficult control: Began in '96 when she began taking Premarin after hysterectomy -- triglycerides increased to 3582 neuropathy in her feet> care for by endocrinologis; levels vary based on stress.;; Labs from March 2015: TC 150, TG 745, HDL 26, LDL 45  . Kidney stones   . LBBB (left bundle branch block) 2009   Initially diagnosed as rate related during stress echo.  . Macular degeneration    "dry in right; treating like wet in the left" (03/15/2016)  . New onset atrial fibrillation ([place]) 01/04/2016   a. s/p ablation in 04/2016  . Paroxysmal SVT (supraventricular tachycardia) ([place]) 2009   less frequent following I&D of chest wall abscess (Jan 015), not previously documented, may have been afib.  . Sarcoma ([place]) 2015   dermatofibro sarcoma protuberens left groin   Past Surgical  History:  Procedure Laterality Date  . Abdominal Doppler ultrasound  February 2009    Right ostial renal artery stenosis; normal renal structure.  . APPENDECTOMY  1968  . CYSTOSCOPY W/ URETEROSCOPY W/ LITHOTRIPSY   1999   Large calcium oxalate stone  . CYSTOSCOPY/RETROGRADE/URETEROSCOPY/STONE EXTRACTION WITH BASKET  1972  . ELECTROPHYSIOLOGIC STUDY N/A 05/13/2016   Procedure: Atrial Fibrillation Ablation;  Surgeon: Thompson Grayer, MD;  Location: [facility];  Service: Cardiovascular;  Laterality: N/A;  . EXCISIONAL HEMORRHOIDECTOMY   1981  . FRACTURE SURGERY    . IRRIGATION AND DEBRIDEMENT SEBACEOUS CYST  January 2015    chest wall; with antibiotics --> following this treatment, SVT episodes became much less frequent.  . ORIF PROXIMAL TIBIAL PLATEAU FRACTURE Left 2007   , Related shattered left tibial plateau with reattachment of torn ligaments and insertion of titanium plate and screws.  Marland Kitchen RENAL ARTERY DUPLEX  01/02/2014   R&L RA - mildly elevated velocities - < 60%; Bilateral Kidneys - normal shape & size.  Marland Kitchen SARCOMA EXCISION Left 2015   dermatofibro sarcoma protuberens, wide excision of groin from front side to buttocks"  . SOFT TISSUE BIOPSY Left 2015  . TOTAL ABDOMINAL HYSTERECTOMY  1994  . TRANSTHORACIC ECHOCARDIOGRAM  2013   normal  . TRANSTHORACIC ECHOCARDIOGRAM  March 2015   aortic sclerosis; without stenosis. Normal EF  . TREADMILL STRESS ECHO  01/2009   No regional wall motion abnormalities with stress == non-ischemic; rate related incomplete left bundle branch block  ROS- all systems are reviewed and negatives except as per HPI above  Current Outpatient Prescriptions  Medication Sig Dispense Refill  . ACCU-CHEK AVIVA PLUS test strip 1 EACH BY OTHER ROUTE 4 (FOUR) TIMES DAILY. USE AS INSTRUCTED DIAG E11.65  5  . ACCU-CHEK SOFTCLIX LANCETS lancets 1 EACH BY OTHER ROUTE 3 (THREE) TIMES A DAY.  5  . acetaminophen (TYLENOL) 325 MG tablet Take 2 tablets (650 mg total)  by mouth every 4 (four) hours as needed for headache or mild pain.    . cholecalciferol (VITAMIN D) 1000 UNITS tablet Take 2,000 Units by mouth daily.     . clonazePAM (KLONOPIN) 0.5 MG tablet Take 0.5-1 tablets by mouth at bedtime as needed for sleep.     Marland Kitchen diltiazem (CARDIZEM CD) 120 MG 24 hr capsule Take 1 capsule (120 mg total) by mouth 2 (two) times daily. 180 capsule 3  . Dulaglutide (TRULICITY) 4.09 WJ/1.9JY SOPN Inject 0.75 mg into the skin once a week. Saturday    . fenofibrate 160 MG tablet Take 160 mg by mouth daily.     . fluticasone (FLONASE) 50 MCG/ACT nasal spray Place 1-2 sprays into both nostrils at bedtime as needed for allergies or rhinitis.    Marland Kitchen glimepiride (AMARYL) 4 MG tablet Take 4 mg by mouth daily.   5  . hydrochlorothiazide (HYDRODIURIL) 25 MG tablet Take 1 tablet (25 mg total) by mouth daily. 90 tablet 3  . Insulin Aspart (NOVOLOG FLEXPEN [place]) Inject 10 Units into the skin 3 (three) times daily before meals. If blood sugar is higher than 120 give 10 units before each meal    . Insulin Detemir (LEVEMIR FLEXTOUCH) 100 UNIT/ML Pen Inject 50 Units into the skin at bedtime.     . metFORMIN (GLUCOPHAGE-XR) 500 MG 24 hr tablet Take 1 tablet (500 mg total) by mouth daily with breakfast. 1000 MG TAKE IN THE EVENING    . metoprolol tartrate (LOPRESSOR) 25 MG tablet Take 1 tablet by mouth every 6 hours as needed for fast heart rates 180 tablet 3  . Multiple Vitamins-Minerals (PRESERVISION AREDS 2 PO) Take 1 tablet by mouth 2 (two) times daily.     Marland Kitchen omega-3 acid ethyl esters (LOVAZA) 1 G capsule Take 2 g by mouth 2 (two) times daily.     Marland Kitchen omeprazole (PRILOSEC OTC) 20 MG tablet Take 20 mg by mouth daily.    . ramipril (ALTACE) 10 MG capsule Take 10 mg by mouth daily.     . rosuvastatin (CRESTOR) 10 MG tablet Take 1 tablet (10 mg total) by mouth daily. 90 tablet 2  . XARELTO 20 MG TABS tablet TAKE 1 TABLET (20 MG TOTAL) BY MOUTH DAILY WITH SUPPER. 90 tablet 1   No current  facility-administered medications for this visit.     Physical Exam: Vitals:   05/08/17 1056  BP: 126/76  Pulse: 78  SpO2: 96%  Weight: 145 lb 4.8 oz (65.9 kg)  Height: 5' 3.25" (1.607 m)    GEN- The patient is well appearing, alert and oriented x 3 today.   Head- normocephalic, atraumatic Eyes-  Sclera clear, conjunctiva pink Ears- hearing intact Oropharynx- clear Lungs- Clear to ausculation bilaterally, normal work of breathing Heart- Regular rate and rhythm, no murmurs, rubs or gallops, PMI not laterally displaced GI- soft, NT, ND, + BS Extremities- no clubbing, cyanosis, or edema  EKG tracing ordered today is personally reviewed and shows sinus rhythm 78 bpm, LBBB (QRS 138 msec)  Assessment and Plan:  1. Persistent afib Doing very well s/p ablation off AAD therapy chads2vasc score is 4.  Continue xarelto We discussed reducing cardizem CD 120mg  BID to daily today.  She is not interested in any changes  2. HTN Stable No change required today  3. CAD RCA stenosis on Cardiac CT but asymptomatic and low risk myoview 6/17 Continue on statin Follows with Dr Ellyn Hack  Follow-up with Dr Ellyn Hack as scheduled Return to see Butch Penny in the AF clinic in 6 months I will see in a year  Thompson Grayer MD, [facility] 05/08/2017 11:22 AM

## 2017-05-22 DIAGNOSIS — Z23 Encounter for immunization: Secondary | ICD-10-CM | POA: Diagnosis not present

## 2017-05-31 ENCOUNTER — Encounter: Payer: Self-pay | Admitting: Cardiology

## 2017-05-31 ENCOUNTER — Ambulatory Visit (INDEPENDENT_AMBULATORY_CARE_PROVIDER_SITE_OTHER): Payer: Medicare Other | Admitting: Cardiology

## 2017-05-31 VITALS — BP 140/70 | HR 88 | Ht 63.0 in | Wt 142.0 lb

## 2017-05-31 DIAGNOSIS — I701 Atherosclerosis of renal artery: Secondary | ICD-10-CM | POA: Diagnosis not present

## 2017-05-31 DIAGNOSIS — E782 Mixed hyperlipidemia: Secondary | ICD-10-CM

## 2017-05-31 DIAGNOSIS — I4819 Other persistent atrial fibrillation: Secondary | ICD-10-CM

## 2017-05-31 DIAGNOSIS — I6502 Occlusion and stenosis of left vertebral artery: Secondary | ICD-10-CM

## 2017-05-31 DIAGNOSIS — Z7901 Long term (current) use of anticoagulants: Secondary | ICD-10-CM | POA: Diagnosis not present

## 2017-05-31 DIAGNOSIS — I481 Persistent atrial fibrillation: Secondary | ICD-10-CM | POA: Diagnosis not present

## 2017-05-31 DIAGNOSIS — I471 Supraventricular tachycardia: Secondary | ICD-10-CM

## 2017-05-31 DIAGNOSIS — I10 Essential (primary) hypertension: Secondary | ICD-10-CM

## 2017-05-31 NOTE — Progress Notes (Addendum)
PCP: Curly Rim, MD  Clinic Note: Chief Complaint  Patient presents with  . Follow-up  . Atrial Fibrillation  . Hyperlipidemia    HPI: Isabella Wright is a 71 y.o. female with a PMH below who presents today for ~1 month f/u for h/o ? PSVT that was eventually borne out to be PAF - she is now s/p Afib Ablation. 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   I last saw her in June 2017 & noted that she was in Afib - which was a new Dx for her - was very upset b/c her husband had Afib prior to dying.   She noted fatigue & SOB. --> started Xarelto & referred her to the Afib Clinic b/c h/o both tachycardia & bradycardia.  30 d event monitor ordered to determine Afib burden. --> was on metoprolol & diltiazem -> tried Tikosyn in late Aug 2017 (in patient initiation) - converted to NSR, unfortunately, she went back into A. fib which she continued to not tolerate well because of fatigue and dyspnea.  Essentially failed Tikosyn and therefore finally we discussed A. fib ablation.  A. fib Ablation (Dr. Rayann Heman) May 13, 2016.-->    Notably improved symptoms following ablation.  She did have some initial episodes of A. fib right after the procedure, but they resolved.  Continued on Tikosyn and Xarelto  She stopped Tikosyn in April 2018  Crestor was converted to Pravachol because there was concern that LDL was too low (thought to be related to vertigo)? -> changed back to Crestor 10 mg   After Tikosyn was stopped, she started titrating down her diltiazem. -->    As of July 2016 (A. fib clinic), diltiazem dose was reduced to 120 mg a day (although currently listed as 240).  Also taking metoprolol twice daily.   August 20th --Bernerd Pho, [place] with concerns of dizziness and blood pressure issues.-No complaints of palpitation or chest discomfort.  Had reported blood pressures in the 200 mmHg  range --> this was associated with dizziness and lightheadedness etc.  Apparently she had been taking Klonopin that was subsequently stopped.  Klonopin was restarted ->   Isabella Wright was last seen on October 22 by Dr. Rayann Heman in the A. fib clinic.  He recommended a slow wean of Klonopin to avoid withdrawal.  Recent Hospitalizations:   January 05, 2017 -ER visit for right knee injury -medial meniscal tear.  Knee injection.   Studies Personally Reviewed - (if available, images/films reviewed: From Epic Chart or Care Everywhere)  2D Echo January 13, 2016: Normal LV size with mild LV hypertrophy. EF 50-55%,   septal-lateral dyssynchrony. Normal RV size and systolic  function. No significant valvular abnormalities  Myoview Stress Test January 07, 2016: LOW RISK.  No ischemia.  Small mid ventricular septal and anterior wall defect likely related to left bundle branch block.  MR angiogram of and neck with/without contrast April 2018: Left vertebral artery occluded at its origin, reconstitutes at the level of V3 and remains patent to the basilar artery.  Question of mild stenosis of the origin of the right vertebral artery.  Otherwise good caliber and patent throughout.  Normal carotids.  Prior MRI of the head: No evidence of acute cerebral infarct.  Abnormal appearance of the distal left vertebral artery flow void.  Small old lacunar infarct within the posterior  limb of right internal capsule.   Interval History: Isabella Wright returns today actually in great spirits she is doing quite well.  She has had some swelling and that is why HCTZ had been restarted at the time of her blood pressure being high.  That is now notably improved.  She has been more active with exercise and watching her diet.  She has lost about 8 pounds since doing so.  She really states that after her ablation, she is been doing much better with no further palpitation spells the previous rapid heartbeat spells that we thought was PSVT have also now  subsequently gone away.  She is very happy happy about that.  She is a bit concerned about the occluded left vertebral artery and wanted to show me the MRA results.  She does not have any acute neurologic findings at this point.  Her vertigo symptoms are seemingly improved significantly.  Her blood pressure is also now notably improved.  Cardiac review of symptoms: No chest pain or shortness of breath with rest or exertion.  No PND, orthopnea or edema. No palpitations, weakness or syncope/near syncope. She has noted some lightheadedness and dizziness--with her vertigo, but states that this is also improved. No TIA/amaurosis fugax symptoms. No melena, hematochezia, hematuria, or epstaxis. No claudication.  ROS: A comprehensive was performed. Review of Systems  Constitutional: Positive for weight loss.  HENT: Negative for congestion.   Respiratory: Negative for shortness of breath.   Cardiovascular:       Per HPI  Musculoskeletal: Positive for joint pain. Negative for falls (Still bothered by knee pain).  Neurological: Positive for dizziness. Negative for tingling and headaches.  Endo/Heme/Allergies: Negative for environmental allergies. Does not bruise/bleed easily.  Psychiatric/Behavioral: Negative for depression and memory loss. The patient has insomnia. The patient is not nervous/anxious.   All other systems reviewed and are negative.  I have reviewed and (if needed) personally updated the patient's problem list, medications, allergies, past medical and surgical history, social and family history.   Past Medical History:  Diagnosis Date  . 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 Diabetes mellitus type 2, controlled ([place]) 2012  . GERD (gastroesophageal reflux disease)   . History of hiatal hernia   . Hypertension 2000   2009: Renal Dopplers showed R RA ostial stenosis -- (told not a problem or 20 yrs)  . Hypertriglyceridemia 1996   Very labile and  difficult control: Began in '96 when she began taking Premarin after hysterectomy -- triglycerides increased to 3582 neuropathy in her feet> care for by endocrinologis; levels vary based on stress.;; Labs from March 2015: TC 150, TG 745, HDL 26, LDL 45  . Kidney stones   . LBBB (left bundle branch block) 2009   Initially diagnosed as rate related during stress echo.  . Macular degeneration    "dry in right; treating like wet in the left" (03/15/2016)  . New onset atrial fibrillation ([facility]) 01/04/2016   a. s/p ablation in 04/2016  . Paroxysmal SVT (supraventricular tachycardia) ([place]) 2009   less frequent following I&D of chest wall abscess (Jan 015), not previously documented, may have been afib.  . Sarcoma ([place]) 2015   dermatofibro sarcoma protuberens left groin  . 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 ->  No evidence of acute cerebral infarct.  Brain MRI shows  abnormal appearance of distal left vertebral artery flow.    Past Surgical History:  Procedure Laterality Date  . Abdominal Doppler ultrasound  February 2009    Right ostial renal artery stenosis; normal renal structure.  . APPENDECTOMY  1968  . Atrial Fibrillation Ablation N/A 05/13/2016   Performed by Thompson Grayer, MD at [facility]  . CYSTOSCOPY W/ URETEROSCOPY W/ LITHOTRIPSY   1999   Large calcium oxalate stone  . CYSTOSCOPY/RETROGRADE/URETEROSCOPY/STONE EXTRACTION WITH BASKET  1972  . EXCISIONAL HEMORRHOIDECTOMY   1981  . FRACTURE SURGERY    . IRRIGATION AND DEBRIDEMENT SEBACEOUS CYST  January 2015    chest wall; with antibiotics --> following this treatment, SVT episodes became much less frequent.  Marland Kitchen NM MYOVIEW LTD  12/2015   LOW RISK.  No ischemia or infarction.  Small septal-anterior defect likely related to left bundle branch block.  . ORIF PROXIMAL TIBIAL PLATEAU FRACTURE Left 2007    , Related shattered left tibial plateau with reattachment of torn ligaments and insertion of titanium plate and screws.  Marland Kitchen RENAL ARTERY DUPLEX  01/02/2014   R&L RA - mildly elevated velocities - < 60%; Bilateral Kidneys - normal shape & size.  Marland Kitchen SARCOMA EXCISION Left 2015   dermatofibro sarcoma protuberens, wide excision of groin from front side to buttocks"  . SOFT TISSUE BIOPSY Left 2015  . TOTAL ABDOMINAL HYSTERECTOMY  1994  . TRANSTHORACIC ECHOCARDIOGRAM  12/2015   Normal LV size and function.  EF 55%.  Mild LVH.  Septal-lateral dyssynchrony (from LBBB).  No significant valvular abnormalities.  Domingo Dimes ECHOCARDIOGRAM  March 2015   aortic sclerosis; without stenosis. Normal EF  . TREADMILL STRESS ECHO  01/2009   No regional wall motion abnormalities with stress == non-ischemic; rate related incomplete left bundle branch block    Current Meds  Medication Sig  . acetaminophen (TYLENOL) 325 MG tablet Take 2 tablets (650 mg total) by mouth every 4 (four) hours as needed for headache or mild pain.  . cholecalciferol (VITAMIN D) 1000 UNITS tablet Take 2,000 Units by mouth daily.   . clonazePAM (KLONOPIN) 0.5 MG tablet Take 0.0125 tablets at bedtime as needed by mouth for sleep.   Marland Kitchen diltiazem (DILACOR XR) 240 MG 24 hr capsule Take 240 mg daily by mouth.  . Dulaglutide (TRULICITY) 4.58 KD/9.8PJ SOPN Inject 0.75 mg into the skin once a week. Saturday  . fenofibrate 160 MG tablet Take 160 mg by mouth daily.   . fluticasone (FLONASE) 50 MCG/ACT nasal spray Place 1-2 sprays into both nostrils at bedtime as needed for allergies or rhinitis.  Marland Kitchen glimepiride (AMARYL) 4 MG tablet Take 4 mg by mouth daily.   . hydrochlorothiazide (HYDRODIURIL) 25 MG tablet Take 1 tablet (25 mg total) by mouth daily.  . Insulin Aspart (NOVOLOG FLEXPEN [place]) Inject 10 Units into the skin 3 (three) times daily before meals. If blood sugar is higher than 120 give 10 units before each meal  . Insulin Detemir (LEVEMIR  FLEXTOUCH) 100 UNIT/ML Pen Inject 50 Units into the skin at bedtime.   . metFORMIN (GLUCOPHAGE-XR) 500 MG 24 hr tablet Take 1 tablet (500 mg total) by mouth daily with breakfast. 1000 MG TAKE IN THE EVENING  . metoprolol tartrate (LOPRESSOR) 25 MG tablet Take 1 tablet by mouth every 6 hours as needed for fast heart rates  . Multiple Vitamins-Minerals (PRESERVISION AREDS 2 PO) Take 1 tablet by mouth 2 (two) times daily.   Marland Kitchen omega-3 acid ethyl esters (LOVAZA)  1 G capsule Take 2 g by mouth 2 (two) times daily.   Marland Kitchen omeprazole (PRILOSEC OTC) 20 MG tablet Take 20 mg by mouth daily.  . ramipril (ALTACE) 10 MG capsule Take 10 mg by mouth daily.   . rosuvastatin (CRESTOR) 10 MG tablet Take 1 tablet (10 mg total) by mouth daily.  Isabella Wright 20 MG TABS tablet TAKE 1 TABLET (20 MG TOTAL) BY MOUTH DAILY WITH SUPPER.    Allergies  Allergen Reactions  . Fluorescein Anaphylaxis    Pt states she had swelling of facial tissue with SOB and itching with a rapid heart rate.  . Demerol [Meperidine] Nausea And Vomiting  . Exenatide Nausea And Vomiting  . Liraglutide Nausea And Vomiting    Social History   Socioeconomic History  . Marital status: Widowed    Spouse name: None  . Number of children: None  . Years of education: None  . Highest education level: None  Social Needs  . Financial resource strain: None  . Food insecurity - worry: None  . Food insecurity - inability: None  . Transportation needs - medical: None  . Transportation needs - non-medical: None  Occupational History  . None  Tobacco Use  . Smoking status: Former Smoker    Packs/day: 0.50    Years: 15.00    Pack years: 7.50    Types: Cigarettes  . Smokeless tobacco: Never Used  . 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 Alcohol use: No  . Drug use: No  . Sexual activity: Not Currently  Other Topics Concern  . 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 She is now widowed for just under 2 years. Her husband died of cancer. They do not have any children.   She recently (spring of 2015) moved to [place] to be closer to her brother, [name] and his family.   She been the caregiver for her ailing husband for 5 years. He passed away in 2012/03/22, and she has had prolonged period of grieving partly due to her having lost 3 members of her immediate family during that time period as well.  --- She is under a lot of stress      She is a former smoker quit years ago. She does not drink alcohol. She is active, but not routine exercise. She says that she tries to eat healthy and knows what usually affects her triglyceride levels.    family history includes Breast cancer in her sister; Heart disease in her brother, father, and mother.  Wt Readings from Last 3 Encounters:  05/31/17 142 lb (64.4 kg)  05/08/17 145 lb 4.8 oz (65.9 kg)  03/06/17 143 lb (64.9 kg)    PHYSICAL EXAM BP 140/70   Pulse 88   Ht 5\' 3"  (1.6 m)   Wt 142 lb (64.4 kg)   BMI 25.15 kg/m  Physical Exam  Constitutional: She is oriented to person, place, and time. She appears well-developed and well-nourished. No distress.  HENT:  Head: Normocephalic and atraumatic.  Mouth/Throat: No oropharyngeal exudate.  Eyes: Conjunctivae and EOM are normal.  Neck: Normal range of motion. Neck supple. No hepatojugular reflux and no JVD present. Carotid bruit is not present.  Cardiovascular: Normal rate, regular rhythm and intact distal pulses. Exam reveals no gallop and no friction rub.  No murmur heard.  Nondisplaced PMI  Pulmonary/Chest: Effort normal and breath sounds normal. No respiratory distress. She has no wheezes. She has no rales.  Abdominal: Soft. Bowel sounds are normal. She exhibits no distension. There is no tenderness. There is no rebound.  Musculoskeletal: Normal range of motion. She exhibits no  edema.  Neurological: She is alert and oriented to person, place, and time.  Skin: Skin is warm and dry. No rash noted. No erythema.  Psychiatric: She has a normal mood and affect. Her behavior is normal. Judgment and thought content normal.  Nursing note and vitals reviewed.   Adult ECG Report No EKG today  Other studies Reviewed: Additional studies/ records that were reviewed today include:  Recent Labs: 03/21/2017  Na+ 141, K+ 4.1, Cl- 99, HCO3-25, BUN 13, Cr 0.71, Glu 119, Ca2+ 9.8; AST 36, ALT29,  AlkP 84,  T Bili 0.3, TP 7.1,Alb 4.6;   A1c 6.8  TC 108, TG 363, HDL 27, LDL 8, direct LDL 35  ASSESSMENT / PLAN: Problem List Items Addressed This Visit    Essential hypertension (Chronic)    Her blood pressure actually looks relatively well controlled today.  She is only on diltiazem and HCTZ with low-dose beta-blocker.  Would not adjust for now.      Relevant Medications   diltiazem (DILACOR XR) 240 MG 24 hr capsule   Other Relevant Orders   VAS US RENAL ARTERY DUPLEX   Long term current use of anticoagulant   Relevant Orders   VAS US CAROTID   Mixed hyperlipidemia: High triglycerides, low HDL (Chronic)    Lipid levels look much better now with her triglycerides still being elevated but LDL being extremely low.  I do think it was acceptable to reduce her dose of Crestor.  She is also on Lovaza and fenofibrate at high dose.  I think her triglyceride levels are elevated probably related to her diabetes.      Relevant Medications   diltiazem (DILACOR XR) 240 MG 24 hr capsule   Persistent atrial fibrillation (HCC) - s/p Ablation, miantaining NSR - Primary (Chronic)    She seems to be maintaining sinus rhythm after her ablation.  She was quite symptomatic when she was in A. fib and now is doing better. She is still on Xarelto for anticoagulation and diltiazem for rate control.  She did better with the higher dose of diltiazem and then having reduced it.      Relevant Medications     diltiazem (DILACOR XR) 240 MG 24 hr capsule   Other Relevant Orders   VAS US CAROTID   Renal artery stenosis (HCC) (Chronic)    Unilateral renal artery stenosis not confirmed by follow-up abdominal aortic Dopplers.  Plan will be to recheck at least one more time to ensure that there is no significant stenosis.      Relevant Medications   diltiazem (DILACOR XR) 240 MG 24 hr capsule   Other Relevant Orders   VAS US RENAL ARTERY DUPLEX   SVT (supraventricular tachycardia) (HCC) (Chronic)    I suspect that what was thought to be SVT in the past was probably atrial flutter/fibrillation as it is no longer present post A. fib ablation.      Relevant Medications   diltiazem (DILACOR XR) 240 MG 24 hr capsule   Vertebral artery obstruction, left (Chronic)    Interesting finding on MRA.  I suspect this may be been a congenital abnormality.  We will get baseline carotid Dopplers on her to assess for potential  subclavian steal.  It is unlikely that this type of occlusion will cause significant symptoms, especially since it seems to reconstituted prior to the basilar artery.      Relevant Medications   diltiazem (DILACOR XR) 240 MG 24 hr capsule   Other Relevant Orders   VAS US CAROTID      Current medicines are reviewed at length with the patient today. (+/- concerns) none The following changes have been made:None  Patient Instructions  SCHEDULE AT [facility]-- IN April 2019 Your physician has requested that you have a carotid duplex. This test is an ultrasound of the carotid arteries in your neck. It looks at blood flow through these arteries that supply the brain with blood. Allow one hour for this exam. There are no restrictions or special instructions. AND Your physician has requested that you have a renal artery duplex. During this test, an ultrasound is used to evaluate blood flow to the kidneys. Allow one hour for this exam. Do not eat after midnight the day  before and avoid carbonated beverages. Take your medications as you usually do.     Your physician wants you to follow-up in April 2019 with DR Sufyaan Palma. You will receive a reminder letter in the mail two months in advance. If you don't receive a letter, please call our office to schedule the follow-up appointment.   If you need a refill on your cardiac medications before your next appointment, please call your pharmacy.    Studies Ordered:   No orders of the defined types were placed in this encounter.     Glenetta Hew, M.D., M.S. Interventional Cardiologist   Pager # [phone] Phone # [phone] [address]. [place], [place] [address]

## 2017-05-31 NOTE — Patient Instructions (Signed)
SCHEDULE AT [facility] April 2019 Your physician has requested that you have a carotid duplex. This test is an ultrasound of the carotid arteries in your neck. It looks at blood flow through these arteries that supply the brain with blood. Allow one hour for this exam. There are no restrictions or special instructions. AND Your physician has requested that you have a renal artery duplex. During this test, an ultrasound is used to evaluate blood flow to the kidneys. Allow one hour for this exam. Do not eat after midnight the day before and avoid carbonated beverages. Take your medications as you usually do.     Your physician wants you to follow-up in April 2019 with DR HARDING. You will receive a reminder letter in the mail two months in advance. If you don't receive a letter, please call our office to schedule the follow-up appointment.   If you need a refill on your cardiac medications before your next appointment, please call your pharmacy.

## 2017-06-02 ENCOUNTER — Encounter: Payer: Self-pay | Admitting: Cardiology

## 2017-06-04 ENCOUNTER — Encounter: Payer: Self-pay | Admitting: Cardiology

## 2017-06-04 NOTE — Assessment & Plan Note (Signed)
She seems to be maintaining sinus rhythm after her ablation.  She was quite symptomatic when she was in A. fib and now is doing better. She is still on Xarelto for anticoagulation and diltiazem for rate control.  She did better with the higher dose of diltiazem and then having reduced it.

## 2017-06-04 NOTE — Assessment & Plan Note (Signed)
I suspect that what was thought to be SVT in the past was probably atrial flutter/fibrillation as it is no longer present post A. fib ablation.

## 2017-06-04 NOTE — Assessment & Plan Note (Signed)
Her blood pressure actually looks relatively well controlled today.  She is only on diltiazem and HCTZ with low-dose beta-blocker.  Would not adjust for now.

## 2017-06-04 NOTE — Assessment & Plan Note (Signed)
Unilateral renal artery stenosis not confirmed by follow-up abdominal aortic Dopplers.  Plan will be to recheck at least one more time to ensure that there is no significant stenosis.

## 2017-06-04 NOTE — Assessment & Plan Note (Addendum)
Interesting finding on MRA.  I suspect this may be been a congenital abnormality.  We will get baseline carotid Dopplers on her to assess for potential subclavian steal.  It is unlikely that this type of occlusion will cause significant symptoms, especially since it seems to reconstituted prior to the basilar artery.

## 2017-06-04 NOTE — Assessment & Plan Note (Signed)
Lipid levels look much better now with her triglycerides still being elevated but LDL being extremely low.  I do think it was acceptable to reduce her dose of Crestor.  She is also on Lovaza and fenofibrate at high dose.  I think her triglyceride levels are elevated probably related to her diabetes.

## 2017-06-06 DIAGNOSIS — H353221 Exudative age-related macular degeneration, left eye, with active choroidal neovascularization: Secondary | ICD-10-CM | POA: Diagnosis not present

## 2017-06-06 DIAGNOSIS — H353114 Nonexudative age-related macular degeneration, right eye, advanced atrophic with subfoveal involvement: Secondary | ICD-10-CM | POA: Diagnosis not present

## 2017-06-06 DIAGNOSIS — H2513 Age-related nuclear cataract, bilateral: Secondary | ICD-10-CM | POA: Diagnosis not present

## 2017-06-11 ENCOUNTER — Other Ambulatory Visit: Payer: Self-pay | Admitting: Cardiology

## 2017-06-23 DIAGNOSIS — E782 Mixed hyperlipidemia: Secondary | ICD-10-CM | POA: Diagnosis not present

## 2017-06-23 DIAGNOSIS — E1169 Type 2 diabetes mellitus with other specified complication: Secondary | ICD-10-CM | POA: Diagnosis not present

## 2017-06-23 DIAGNOSIS — I701 Atherosclerosis of renal artery: Secondary | ICD-10-CM | POA: Diagnosis not present

## 2017-06-23 DIAGNOSIS — I10 Essential (primary) hypertension: Secondary | ICD-10-CM | POA: Diagnosis not present

## 2017-06-23 DIAGNOSIS — E1100 Type 2 diabetes mellitus with hyperosmolarity without nonketotic hyperglycemic-hyperosmolar coma (NKHHC): Secondary | ICD-10-CM | POA: Diagnosis not present

## 2017-07-20 ENCOUNTER — Ambulatory Visit: Payer: Medicare Other | Admitting: Cardiovascular Disease

## 2017-08-22 ENCOUNTER — Telehealth: Payer: Self-pay | Admitting: Cardiology

## 2017-08-22 NOTE — Telephone Encounter (Signed)
That is fine - Vascepa is better.  Glenetta Hew, MD

## 2017-08-22 NOTE — Telephone Encounter (Signed)
Returned call to patient of Dr. Ellyn Hack concerning Lovaza. She received a letter that states she can get a temporary supply of this medication, and she will need this medication either changed or prior authorization. Vascepa, fenofibrate (already on), niacin is covered and on formulary per patient.  [employer] - Part D Insurance: Medicare Supplement: AMA Member ID: [id number] Phone: [phone]  Routed to RN for prior authorization and MD to see if he would like to change from Lovaza to [facility]

## 2017-08-22 NOTE — Telephone Encounter (Signed)
New Message    Pt c/o medication issue:  1. Name of Medication omega-3 acid ethyl esters (LOVAZA) 2. How are you currently taking this medication (dosage and times per day)?  1 G capsule     3. Are you having a reaction (difficulty breathing--STAT)? None  4. What is your medication issue? Patient is calling because she received a letter from her medicare part D advising that they will no longer cover this rx. Her options are to try another medication or have the provide submit to the insurance for an exception. Please call to discuss.

## 2017-08-24 NOTE — Telephone Encounter (Signed)
Dose - 2gram BID?

## 2017-08-28 ENCOUNTER — Encounter: Payer: Self-pay | Admitting: Family Medicine

## 2017-08-28 ENCOUNTER — Ambulatory Visit (INDEPENDENT_AMBULATORY_CARE_PROVIDER_SITE_OTHER): Payer: Medicare Other | Admitting: Family Medicine

## 2017-08-28 VITALS — BP 150/80 | HR 83 | Temp 98.4°F | Ht 63.0 in | Wt 141.0 lb

## 2017-08-28 DIAGNOSIS — R0981 Nasal congestion: Secondary | ICD-10-CM

## 2017-08-28 MED ORDER — IPRATROPIUM BROMIDE 0.06 % NA SOLN: 2.0000 | Freq: Four times a day (QID) | NASAL | 0 refills | Status: DC

## 2017-08-28 NOTE — Patient Instructions (Signed)
It was very nice to meet you today.  Start the atrovent.   Please let me know if your symptoms worsen or fail to improve.  Come back to see me in 6-12 months.   Take care, Dr Jerline Pain

## 2017-08-28 NOTE — Progress Notes (Signed)
Subjective:  Isabella Wright is a 72 y.o. female who presents today with a chief complaint of head congestion and to establish care.  HPI:  Patient is a retired Music therapist.  She recently moved to [place] for a few months ago.  Previously was living in [place].  Prior to that she [place] over [place] with her husband who is a Publishing rights manager.  Unfortunately, her husband passed away about 4 years ago.  She has been living nearby with family since then.  Head congestion, new issue Symptoms started about a week ago, and have actually improved over the last few days.  No fevers.  A little bit of cough.  Symptoms are worse at night.  She has tried taking cough medication with codeine which is helped a little bit.  No obvious precipitating events.  No other obvious alleviating or aggravating factors.  ROS: Per HPI, otherwise a 10 point review of systems was performed and was negative  PMH:  The following were reviewed and entered/updated in epic: Past Medical History:  Diagnosis Date  . Colon polyps 2016  . 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 Diabetes mellitus type 2, controlled ([place]) 2012  . GERD (gastroesophageal reflux disease)   . History of hiatal hernia   . Hypertension 2000   2009: Renal Dopplers showed R RA ostial stenosis -- (told not a problem or 20 yrs)  . Hypertriglyceridemia 1996   Very labile and difficult control: Began in '96 when she began taking Premarin after hysterectomy -- triglycerides increased to 3582 neuropathy in her feet> care for by endocrinologis; levels vary based on stress.;; Labs from March 2015: TC 150, TG 745, HDL 26, LDL 45  . Kidney stones   . LBBB (left bundle branch block) 2009   Initially diagnosed as rate related during stress echo.  . Macular degeneration    "dry in right; treating like wet in the left" (03/15/2016)  . New onset atrial fibrillation ([place]) 01/04/2016   a. s/p ablation in 04/2016    . Paroxysmal SVT (supraventricular tachycardia) ([place]) 2009   less frequent following I&D of chest wall abscess (Jan 015), not previously documented, may have been afib.  . Sarcoma ([place]) 2015   dermatofibro sarcoma protuberens left groin  . 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 ->  No evidence of acute cerebral infarct.  Brain MRI shows abnormal appearance of distal left vertebral artery flow.   Patient Active Problem List   Diagnosis Date Noted  . Long term current use of anticoagulant 03/06/2017  . Hyperlipemia, mixed 03/06/2017  . Vertebral artery obstruction, left 10/29/2016  . A-fib ([place]) 05/13/2016  . Persistent atrial fibrillation ([place]) - s/p Ablation, miantaining NSR 03/15/2016  . Bradycardia 01/05/2016  . Essential hypertension 03/19/2014  . Encounter for long-term (current) use of other medications 12/03/2013  . Renal artery stenosis ([place]) 12/03/2013  . Mixed hyperlipidemia: High triglycerides, low HDL 12/03/2013  . LBBB (left bundle branch block) 12/03/2013  . SVT (supraventricular tachycardia) ([place]) 12/03/2013   Past Surgical History:  Procedure Laterality Date  . Abdominal Doppler ultrasound  February 2009    Right ostial renal artery stenosis; normal renal structure.  . APPENDECTOMY  1968  . CARDIAC ELECTROPHYSIOLOGY [place] AND ABLATION  2017  . [place]  Large calcium oxalate stone  . CYSTOSCOPY/RETROGRADE/URETEROSCOPY/STONE EXTRACTION WITH BASKET  1972  . ELECTROPHYSIOLOGIC STUDY N/A 05/13/2016   Procedure: Atrial Fibrillation Ablation;  Surgeon: Thompson Grayer, MD;  Location: [facility];  Service: Cardiovascular;  Laterality: N/A;  . EXCISIONAL HEMORRHOIDECTOMY   1981  . FRACTURE SURGERY    . IRRIGATION AND DEBRIDEMENT SEBACEOUS CYST  January 2015    chest wall; with antibiotics -->  following this treatment, SVT episodes became much less frequent.  Marland Kitchen NM MYOVIEW LTD  12/2015   LOW RISK.  No ischemia or infarction.  Small septal-anterior defect likely related to left bundle branch block.  . ORIF PROXIMAL TIBIAL PLATEAU FRACTURE Left 2007   , Related shattered left tibial plateau with reattachment of torn ligaments and insertion of titanium plate and screws.  Marland Kitchen RENAL ARTERY DUPLEX  01/02/2014   R&L RA - mildly elevated velocities - < 60%; Bilateral Kidneys - normal shape & size.  Marland Kitchen SARCOMA EXCISION Left 2015   dermatofibro sarcoma protuberens, wide excision of groin from front side to buttocks"  . SOFT TISSUE BIOPSY Left 2015  . TOTAL ABDOMINAL HYSTERECTOMY  1994  . TRANSTHORACIC ECHOCARDIOGRAM  12/2015   Normal LV size and function.  EF 55%.  Mild LVH.  Septal-lateral dyssynchrony (from LBBB).  No significant valvular abnormalities.  Domingo Dimes ECHOCARDIOGRAM  March 2015   aortic sclerosis; without stenosis. Normal EF  . TREADMILL STRESS ECHO  01/2009   No regional wall motion abnormalities with stress == non-ischemic; rate related incomplete left bundle branch block    Family History  Problem Relation Age of Onset  . Heart disease Brother        Died at 38  . Heart disease Mother        Died at 102  . Heart disease Father        Died at 11  . Breast cancer Sister        Died at 65    Medications- reviewed and updated Current Outpatient Medications  Medication Sig Dispense Refill  . ACCU-CHEK AVIVA PLUS test strip 1 EACH BY OTHER ROUTE 4 (FOUR) TIMES DAILY. USE AS INSTRUCTED DIAG E11.65  5  . ACCU-CHEK SOFTCLIX LANCETS lancets 1 EACH BY OTHER ROUTE 3 (THREE) TIMES A DAY.  5  . acetaminophen (TYLENOL) 325 MG tablet Take 2 tablets (650 mg total) by mouth every 4 (four) hours as needed for headache or mild pain.    . cholecalciferol (VITAMIN D) 1000 UNITS tablet Take 2,000 Units by mouth daily.     Marland Kitchen diltiazem (DILACOR XR) 120 MG 24 hr capsule Take 120 mg by  mouth daily.    . Dulaglutide (TRULICITY) 8.24 MP/5.3IR SOPN Inject 0.75 mg into the skin once a week. Saturday    . fenofibrate 160 MG tablet Take 160 mg by mouth daily.     . fluticasone (FLONASE) 50 MCG/ACT nasal spray Place 1-2 sprays into both nostrils at bedtime as needed for allergies or rhinitis.    . Insulin Aspart (NOVOLOG FLEXPEN ) Inject 10 Units into the skin 3 (three) times daily before meals. If blood sugar is higher than 120 give 10 units before each meal    . Insulin Detemir (LEVEMIR FLEXTOUCH) 100 UNIT/ML Pen Inject 50 Units into the skin at bedtime.     . metFORMIN (GLUCOPHAGE-XR) 500 MG 24 hr tablet Take 1 tablet (500 mg total) by mouth daily with breakfast. 1000 MG TAKE IN THE EVENING    . metoprolol  tartrate (LOPRESSOR) 25 MG tablet Take 1 tablet by mouth every 6 hours as needed for fast heart rates 180 tablet 3  . Multiple Vitamins-Minerals (PRESERVISION AREDS 2 PO) Take 1 tablet by mouth 2 (two) times daily.     Marland Kitchen omega-3 acid ethyl esters (LOVAZA) 1 G capsule Take 2 g by mouth 2 (two) times daily.     Marland Kitchen omeprazole (PRILOSEC OTC) 20 MG tablet Take 20 mg by mouth daily.    . ramipril (ALTACE) 10 MG capsule Take 10 mg by mouth daily.     . rosuvastatin (CRESTOR) 10 MG tablet Take 1 tablet (10 mg total) by mouth daily. 90 tablet 2  . XARELTO 20 MG TABS tablet TAKE 1 TABLET (20 MG TOTAL) BY MOUTH DAILY WITH SUPPER. 90 tablet 1  . Continuous Blood Gluc Sensor (FREESTYLE LIBRE SENSOR SYSTEM) MISC 1 EACH BY DOES NOT APPLY ROUTE EVERY 10 DAYS.  99  . hydrochlorothiazide (HYDRODIURIL) 25 MG tablet Take 1 tablet (25 mg total) by mouth daily. 90 tablet 3  . ipratropium (ATROVENT) 0.06 % nasal spray Place 2 sprays into both nostrils 4 (four) times daily. 15 mL 0   No current facility-administered medications for this visit.     Allergies-reviewed and updated Allergies  Allergen Reactions  . Fluorescein Anaphylaxis    Pt states she had swelling of facial tissue with SOB and  itching with a rapid heart rate.  . Demerol [Meperidine] Nausea And Vomiting  . Exenatide Nausea And Vomiting  . Liraglutide Nausea And Vomiting    Social History   Socioeconomic History  . Marital status: Widowed    Spouse name: None  . Number of children: None  . Years of education: None  . Highest education level: None  Social Needs  . Financial resource strain: None  . Food insecurity - worry: None  . Food insecurity - inability: None  . Transportation needs - medical: None  . Transportation needs - non-medical: None  Occupational History  . None  Tobacco Use  . Smoking status: Former Smoker    Packs/day: 0.50    Years: 15.00    Pack years: 7.50    Types: Cigarettes  . Smokeless tobacco: Never Used  . 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 Alcohol use: No  . Drug use: No  . Sexual activity: Not Currently  Other Topics Concern  . 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 She is now widowed for just under 2 years. Her husband died of cancer. They do not have any children.   She recently (spring of 2015) moved to [place] to be closer to her brother, [name] and his family.   She been the caregiver for her ailing husband for 5 years. He passed away in Mar 18, 2012, and she has had prolonged period of grieving partly due to her having lost 3 members of her immediate family during that time period as well.  --- She is under a lot of stress      She is a former smoker quit years ago. She does not drink alcohol. She is active, but not routine exercise. She says that she tries to eat healthy and knows what usually affects her triglyceride levels.   Objective:  Physical Exam: BP (!) 150/80 (BP Location: Left Arm, Patient Position: Sitting, Cuff Size: Normal)  Pulse 83   Temp 98.4 F (36.9 C) (Oral)   Ht 5\' 3"  (1.6 m)   Wt  141 lb (64 kg)   SpO2 95%   BMI 24.98 kg/m   Gen: NAD, resting comfortably HEENT: TMs are clear effusion bilaterally.  Maxillary sinuses with decreased transillumination bilaterally.  Oropharynx clear. CV: RRR with no murmurs appreciated Pulm: NWOB, CTAB with no crackles, wheezes, or rhonchi GI: Normal bowel sounds present. Soft, Nontender, Nondistended. MSK: No edema, cyanosis, or clubbing noted Skin: Warm, dry Neuro: Grossly normal, moves all extremities Psych: Normal affect and thought content  Assessment/Plan:  Sinus congestion Start Atrovent nasal spray.  No red flag signs or symptoms.  Encouraged good oral hydration.  Return precautions reviewed.  Follow-up as needed.  Preventive healthcare Obtain records from previous PCP.  She will follow-up with me soon for her annual physical.  Algis Greenhouse. Jerline Pain, MD 08/28/2017 5:42 PM

## 2017-08-29 MED ORDER — ICOSAPENT ETHYL 1 G PO CAPS: 2.0000 g | ORAL_CAPSULE | Freq: Two times a day (BID) | ORAL | 5 refills | Status: DC

## 2017-08-29 NOTE — Telephone Encounter (Signed)
Patient called w/MD recommendations. Agreed w/plan. Rx(s) sent to pharmacy electronically.

## 2017-08-29 NOTE — Telephone Encounter (Signed)
Yes DH 

## 2017-09-04 ENCOUNTER — Other Ambulatory Visit: Payer: Self-pay

## 2017-09-04 ENCOUNTER — Telehealth: Payer: Self-pay | Admitting: Family Medicine

## 2017-09-04 MED ORDER — DOXYCYCLINE HYCLATE 100 MG PO TABS: 100.0000 mg | ORAL_TABLET | Freq: Two times a day (BID) | ORAL | 0 refills | Status: DC

## 2017-09-04 NOTE — Telephone Encounter (Signed)
Since it has bee a couple of weeks, would be reasonable to start abx. Please send in doxycycline 100mg  bid for 10 days.  Algis Greenhouse. Jerline Pain, MD 09/04/2017 1:26 PM

## 2017-09-04 NOTE — Telephone Encounter (Signed)
Please advise.   Copied from [place] [id number]. Topic: Inquiry >> Sep 04, 2017 12:26 PM Oliver Pila B wrote: Reason for CRM: pt states she is still congested in the nasal area, pt states she has congestion in her throat as well, the flonase pt states she was giving made it difficult for her to breathe due to the fact that it dried her nasal cavity so much, pt states she took a sudafed(???) and she was able to breathe, pt would like to get some counsel on what she can do or if anything needs to be called in for her

## 2017-09-04 NOTE — Telephone Encounter (Signed)
Patient called states that she was seen last week and told to call in this week and let you know how she was doing. States that she is very congested with cough. It is productive and clear. States that the one sudafed she took helped but she did not want to take much due to BP. She wanted to know if you felt like an antibiotic would be helpful at this point.

## 2017-09-04 NOTE — Telephone Encounter (Signed)
Patient informed will call if any questions or problems.

## 2017-09-14 ENCOUNTER — Other Ambulatory Visit: Payer: Self-pay

## 2017-09-14 MED ORDER — RAMIPRIL 10 MG PO CAPS: 10.0000 mg | ORAL_CAPSULE | Freq: Every day | ORAL | 3 refills | Status: DC

## 2017-09-27 ENCOUNTER — Other Ambulatory Visit: Payer: Self-pay | Admitting: Cardiology

## 2017-09-27 DIAGNOSIS — I6523 Occlusion and stenosis of bilateral carotid arteries: Secondary | ICD-10-CM

## 2017-09-27 DIAGNOSIS — I6502 Occlusion and stenosis of left vertebral artery: Secondary | ICD-10-CM

## 2017-10-24 ENCOUNTER — Ambulatory Visit (HOSPITAL_COMMUNITY)
Admission: RE | Admit: 2017-10-24 | Discharge: 2017-10-24 | Disposition: A | Discharge status: Home / Self Care | Payer: Medicare Other | Source: Ambulatory Visit | Attending: Cardiology | Admitting: Cardiology

## 2017-10-24 DIAGNOSIS — I10 Essential (primary) hypertension: Secondary | ICD-10-CM | POA: Insufficient documentation

## 2017-10-24 DIAGNOSIS — I701 Atherosclerosis of renal artery: Secondary | ICD-10-CM | POA: Diagnosis present

## 2017-10-24 DIAGNOSIS — Z8679 Personal history of other diseases of the circulatory system: Secondary | ICD-10-CM | POA: Diagnosis not present

## 2017-10-24 DIAGNOSIS — E785 Hyperlipidemia, unspecified: Secondary | ICD-10-CM | POA: Insufficient documentation

## 2017-10-24 DIAGNOSIS — Z09 Encounter for follow-up examination after completed treatment for conditions other than malignant neoplasm: Secondary | ICD-10-CM | POA: Insufficient documentation

## 2017-10-24 DIAGNOSIS — I6523 Occlusion and stenosis of bilateral carotid arteries: Secondary | ICD-10-CM | POA: Diagnosis present

## 2017-10-24 DIAGNOSIS — I6502 Occlusion and stenosis of left vertebral artery: Secondary | ICD-10-CM | POA: Diagnosis not present

## 2017-10-24 DIAGNOSIS — Z87891 Personal history of nicotine dependence: Secondary | ICD-10-CM | POA: Insufficient documentation

## 2017-10-24 DIAGNOSIS — E119 Type 2 diabetes mellitus without complications: Secondary | ICD-10-CM | POA: Insufficient documentation

## 2017-10-28 ENCOUNTER — Other Ambulatory Visit: Payer: Self-pay | Admitting: Internal Medicine

## 2017-10-30 ENCOUNTER — Encounter: Payer: Self-pay | Admitting: Cardiology

## 2017-10-30 ENCOUNTER — Ambulatory Visit (INDEPENDENT_AMBULATORY_CARE_PROVIDER_SITE_OTHER): Payer: Medicare Other | Admitting: Cardiology

## 2017-10-30 DIAGNOSIS — I481 Persistent atrial fibrillation: Secondary | ICD-10-CM

## 2017-10-30 DIAGNOSIS — E782 Mixed hyperlipidemia: Secondary | ICD-10-CM

## 2017-10-30 DIAGNOSIS — I10 Essential (primary) hypertension: Secondary | ICD-10-CM

## 2017-10-30 DIAGNOSIS — I4819 Other persistent atrial fibrillation: Secondary | ICD-10-CM

## 2017-10-30 DIAGNOSIS — I447 Left bundle-branch block, unspecified: Secondary | ICD-10-CM

## 2017-10-30 DIAGNOSIS — Z7901 Long term (current) use of anticoagulants: Secondary | ICD-10-CM | POA: Diagnosis not present

## 2017-10-30 DIAGNOSIS — I701 Atherosclerosis of renal artery: Secondary | ICD-10-CM

## 2017-10-30 DIAGNOSIS — I6502 Occlusion and stenosis of left vertebral artery: Secondary | ICD-10-CM | POA: Diagnosis not present

## 2017-10-30 DIAGNOSIS — I471 Supraventricular tachycardia: Secondary | ICD-10-CM

## 2017-10-30 MED ORDER — DILTIAZEM HCL ER 240 MG PO CP24: 240.0000 mg | ORAL_CAPSULE | Freq: Every day | ORAL | 11 refills | Status: DC

## 2017-10-30 NOTE — Progress Notes (Signed)
PCP: Vivi Barrack, MD  Clinic Note: Chief Complaint  Patient presents with  . Follow-up    No real complaints  . Atrial Fibrillation    HPI: Isabella Wright is a 72 y.o. female with a PMH below who presents today for ~1 month f/u for h/o ? PSVT that was eventually borne out to be PAF - she is now s/p Afib Ablation. 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  --> June 2017 & New Dx Afib -  was very upset b/c her husband had Afib prior to dying.   She noted fatigue & SOB. --> started Xarelto & referred her to the Afib Clinic b/c h/o both tachycardia & bradycardia.  30 d event monitor ordered to determine Afib burden. --> was on metoprolol & diltiazem -> Essentially failed Tikosyn and therefore finally we discussed A. fib ablation.  A. fib Ablation (Dr. Rayann Heman) May 13, 2016.-->    She stopped Tikosyn in April 2018  After Tikosyn was stopped, she started titrating down her diltiazem. -->    March 06, 2017 --[place], [place] with concerns of dizziness and blood pressure issues.-No complaints of palpitation or chest discomfort.  Had reported blood pressures in the 200 mmHg range --> this was associated with dizziness and lightheadedness etc.  Apparently she had been taking Klonopin that was subsequently stopped.  Klonopin was restarted ->   Isabella Wright was last seen on October 22 by Dr. Rayann Heman in the A. fib clinic.  He recommended a slow wean of Klonopin to avoid withdrawal.  I saw her in November.  Recent Hospitalizations:   January 05, 2017 -ER visit for right knee injury -medial meniscal tear.  Knee injection.   Studies Personally Reviewed - (if available, images/films reviewed: From Epic Chart or Care Everywhere)  Renal & Carotid Dopplers: Renal duplex did not show any evidence of renal artery stenosis.  Normal kidney size as well.  Carotid Doppler showed mild plaque in both  right and left internal and common carotid arteries.  Right vertebral artery was patent but left vertebral artery was proximally occluded.  Normal subclavian waveform.  No sinus occluding steal.   Interval History: Isabella Wright returns today still in good spirits and doing relatively well. She has a very positive outlook on life. Isabella Wright since we have had her on 240 mg of diltiazem (during the visit, it appeared that she was taking 120 mg, but she confirmed from home that she is taking 240 mg) she has only had use minimal doses of metoprolol when necessary. She says that she's had 3 breakthrough episode that felt like A. fib in the last year 2 of them lasted less than 15 minutes and the last lasted about 40 minutes. Each somewhat resolved after taking the metoprolol. Overall, as a result she is feeling much better from a cardiac standpoint she denies any real lightheadedness or dizziness. She had lots of questions about her carotid Doppler results showing an occluded vertebral artery, but she is not had any syncope or near syncope TIA or amaurosis fugax symptoms.  She really has not had that much in the way of any swelling since restarting HCTZ now.  (Question whether or not it was truly SVT versus A. fib).  Cardiovascular review of symptoms: positive for - rapid heart rate and associated dizzinesss -- (3 prolonged - as noted) negative  for - chest pain, dyspnea on exertion, edema, orthopnea, paroxysmal nocturnal dyspnea or shortness of breath She remains on Xarelto (asking how much longer she needs to be on it) without any major bleeding issues. The major thing she notes is that she is having lots of trouble sleeping.  She is a hard time falling asleep and then staying asleep.  She denies any dizziness or lightheadedness with using her left arm.  Nor does she notice any claudication symptoms in her left arm.  Nothing to suggest subclavian steal.  ROS: A comprehensive was performed. Review of Systems    Constitutional: Positive for weight loss.  HENT: Negative for congestion.   Respiratory: Negative for shortness of breath.   Cardiovascular:       Per HPI  Musculoskeletal: Positive for joint pain. Negative for falls (Still bothered by knee pain).  Neurological: Positive for dizziness. Negative for tingling and headaches.  Endo/Heme/Allergies: Negative for environmental allergies. Does not bruise/bleed easily.  Psychiatric/Behavioral: Negative for depression and memory loss. The patient has insomnia. The patient is not nervous/anxious.   All other systems reviewed and are negative.  I have reviewed and (if needed) personally updated the patient's problem list, medications, allergies, past medical and surgical history, social and family history.   Past Medical History:  Diagnosis Date  . Colon polyps 2016  . 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 Diabetes mellitus type 2, controlled ([place]) 2012  . GERD (gastroesophageal reflux disease)   . History of hiatal hernia   . Hypertension 2000   2009: Renal Dopplers showed R RA ostial stenosis -- (told not a problem or 20 yrs)  . Hypertriglyceridemia 1996   Very labile and difficult control: Began in '96 when she began taking Premarin after hysterectomy -- triglycerides increased to 3582 neuropathy in her feet> care for by endocrinologis; levels vary based on stress.;; Labs from March 2015: TC 150, TG 745, HDL 26, LDL 45  . Kidney stones   . LBBB (left bundle branch block) 2009   Initially diagnosed as rate related during stress echo.  . Macular degeneration    "dry in right; treating like wet in the left" (03/15/2016)  . Paroxysmal SVT (supraventricular tachycardia) ([place]) 2009   less frequent following I&D of chest wall abscess (Jan 015), not previously documented, may have been afib.  . Personal history of atrial fibrillation 01/04/2016   a. s/p ablation in 04/2016  . Sarcoma ([place]) 2015   dermatofibro  sarcoma protuberens left groin  . 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 ->  No evidence of acute cerebral infarct.  Brain MRI shows abnormal appearance of distal left vertebral artery flow.  . Vertebral artery occlusion, left    Noted on MRI/A of the neck in April 2018 --left vertebral artery occluded at origin and reconstitutes at level of V3.  This is similar to what was found on carotid Dopplers. -->  ASYMPTOMATIC    Past Surgical History:  Procedure Laterality Date  . Abdominal Doppler ultrasound  February 2009    Right ostial renal artery stenosis; normal renal structure.  . APPENDECTOMY  1968  . CARDIAC ELECTROPHYSIOLOGY [place] AND ABLATION  2017  . CYSTOSCOPY W/ URETEROSCOPY W/ LITHOTRIPSY   1999   Large calcium oxalate stone  . CYSTOSCOPY/RETROGRADE/URETEROSCOPY/STONE EXTRACTION WITH BASKET  1972  . ELECTROPHYSIOLOGIC STUDY  N/A 05/13/2016   Procedure: Atrial Fibrillation Ablation;  Surgeon: Thompson Grayer, MD;  Location: [facility];  Service: Cardiovascular;  Laterality: N/A;  . EXCISIONAL HEMORRHOIDECTOMY   1981  . FRACTURE SURGERY    . IRRIGATION AND DEBRIDEMENT SEBACEOUS CYST  January 2015    chest wall; with antibiotics --> following this treatment, SVT episodes became much less frequent.  Marland Kitchen NM MYOVIEW LTD  12/2015   LOW RISK.  No ischemia or infarction.  Small septal-anterior defect likely related to left bundle branch block.  . ORIF PROXIMAL TIBIAL PLATEAU FRACTURE Left 2007   , Related shattered left tibial plateau with reattachment of torn ligaments and insertion of titanium plate and screws.  Marland Kitchen RENAL ARTERY DUPLEX  01/02/2014   R&L RA - mildly elevated velocities - < 60%; Bilateral Kidneys - normal shape & size.  Marland Kitchen SARCOMA EXCISION Left 2015   dermatofibro sarcoma protuberens, wide excision of groin from front side to buttocks"  .  SOFT TISSUE BIOPSY Left 2015  . TOTAL ABDOMINAL HYSTERECTOMY  1994  . TRANSTHORACIC ECHOCARDIOGRAM  12/2015   Normal LV size and function.  EF 55%.  Mild LVH.  Septal-lateral dyssynchrony (from LBBB).  No significant valvular abnormalities.  Marland Kitchen TREADMILL STRESS ECHO  01/2009   No regional wall motion abnormalities with stress == non-ischemic; rate related incomplete left bundle branch block    Current Meds  Medication Sig  . ACCU-CHEK AVIVA PLUS test strip 1 EACH BY OTHER ROUTE 4 (FOUR) TIMES DAILY. USE AS INSTRUCTED DIAG E11.65  . ACCU-CHEK SOFTCLIX LANCETS lancets 1 EACH BY OTHER ROUTE 3 (THREE) TIMES A DAY.  Marland Kitchen acetaminophen (TYLENOL) 325 MG tablet Take 2 tablets (650 mg total) by mouth every 4 (four) hours as needed for headache or mild pain.  . cholecalciferol (VITAMIN D) 1000 UNITS tablet Take 2,000 Units by mouth daily.   . Continuous Blood Gluc Sensor (FREESTYLE LIBRE SENSOR SYSTEM) MISC 1 EACH BY DOES NOT APPLY ROUTE EVERY 10 DAYS.  . Dulaglutide (TRULICITY) 0.86 VH/8.4ON SOPN Inject 0.75 mg into the skin once a week. Saturday  . fenofibrate 160 MG tablet Take 160 mg by mouth daily.   . fluticasone (FLONASE) 50 MCG/ACT nasal spray Place 1-2 sprays into both nostrils at bedtime as needed for allergies or rhinitis.  . hydrochlorothiazide (HYDRODIURIL) 25 MG tablet Take 25 mg by mouth daily.  Vanessa Kick Ethyl 1 g CAPS Take 2 g by mouth 2 (two) times daily.  . Insulin Aspart (NOVOLOG FLEXPEN [place]) Inject 10 Units into the skin 3 (three) times daily before meals. If blood sugar is higher than 120 give 10 units before each meal  . Insulin Detemir (LEVEMIR FLEXTOUCH) 100 UNIT/ML Pen Inject 50 Units into the skin at bedtime.   . metFORMIN (GLUCOPHAGE-XR) 500 MG 24 hr tablet Take 1 tablet (500 mg total) by mouth daily with breakfast. 1000 MG TAKE IN THE EVENING  . metoprolol tartrate (LOPRESSOR) 25 MG tablet Take 1 tablet by mouth every 6 hours as needed for fast heart rates  . Multiple  Vitamins-Minerals (PRESERVISION AREDS 2 PO) Take 1 tablet by mouth 2 (two) times daily.   Marland Kitchen omeprazole (PRILOSEC OTC) 20 MG tablet Take 20 mg by mouth daily.  . ramipril (ALTACE) 10 MG capsule Take 10 mg by mouth daily.   . rosuvastatin (CRESTOR) 10 MG tablet Take 1 tablet (10 mg total) by mouth daily.  Alveda Reasons 20 MG TABS tablet TAKE 1 TABLET (20 MG TOTAL) BY MOUTH DAILY  WITH SUPPER.  . [DISCONTINUED] diltiazem (DILACOR XR) 120 MG 24 hr capsule Take 120 mg by mouth daily.    Allergies  Allergen Reactions  . Fluorescein Anaphylaxis    Pt states she had swelling of facial tissue with SOB and itching with a rapid heart rate.  . Demerol [Meperidine] Nausea And Vomiting  . Exenatide Nausea And Vomiting  . Liraglutide Nausea And Vomiting    Social History   Tobacco Use  . Smoking status: Former Smoker    Packs/day: 0.50    Years: 15.00    Pack years: 7.50    Types: Cigarettes  . Smokeless tobacco: Never Used  . Tobacco comment: "smoked in anesthesia school in the '70s; then just a social smoker til I quit"  Substance Use Topics  . Alcohol use: No  . 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 She is now widowed for just under 2 years. Her husband died of cancer. They do not have any children.   She recently (spring of 2015) moved to [place] to be closer to her brother, Isabella Wright and his family.   She been the caregiver for her ailing husband for 5 years. He passed away in 2012/04/08, and she has had prolonged period of grieving partly due to her having lost 3 members of her immediate family during that time period as well.  --- She is under a lot of stress      She is a former smoker quit years ago. She does not drink alcohol. She is active, but not routine exercise. She says that she tries to eat healthy and knows what usually affects her triglyceride levels.    family history includes Breast  cancer in her sister; Heart disease in her brother, father, and mother.  Wt Readings from Last 3 Encounters:  10/30/17 141 lb 3.2 oz (64 kg)  08/28/17 141 lb (64 kg)  05/31/17 142 lb (64.4 kg)    PHYSICAL EXAM BP (!) 158/83 (BP Location: Right Arm)   Pulse 78   Ht 5\' 3"  (1.6 m)   Wt 141 lb 3.2 oz (64 kg)   SpO2 96%   BMI 25.01 kg/m  Physical Exam  Constitutional: She is oriented to person, place, and time. She appears well-developed and well-nourished. No distress.  HENT:  Head: Normocephalic and atraumatic.  Mouth/Throat: No oropharyngeal exudate.  Eyes: EOM are normal.  Neck: Normal range of motion. Neck supple. No hepatojugular reflux and no JVD present. Carotid bruit is not present.  Cardiovascular: Normal rate, regular rhythm and intact distal pulses. Exam reveals no gallop and no friction rub.  No murmur heard. Nondisplaced PMI  Pulmonary/Chest: Effort normal and breath sounds normal. No respiratory distress. She has no wheezes. She has no rales.  Abdominal: Soft. Bowel sounds are normal. She exhibits no distension. There is no tenderness. There is no rebound.  Musculoskeletal: Normal range of motion. She exhibits no edema.  Neurological: She is alert and oriented to person, place, and time.  Psychiatric: She has a normal mood and affect. Her behavior is normal. Judgment and thought content normal.  Nursing note and vitals reviewed.   Adult ECG Report No EKG today  Other studies Reviewed: Additional studies/ records that were reviewed today include:  Recent Labs: 03/21/2017 -- no recent labs  Na+ 141, K+ 4.1, Cl- 99, HCO3-25, BUN 13, Cr 0.71, Glu 119, Ca2+ 9.8;  AST 36, ALT29,  AlkP 84,  T Bili 0.3, TP 7.1,Alb 4.6;   A1c 6.8  TC 108, TG 363, HDL 27, LDL 8, direct LDL 35  ASSESSMENT / PLAN: Problem List Items Addressed This Visit    Vertebral artery obstruction, left (Chronic)    Suspect this may been a congenital defect.  I can insert it was not something to be  concerned about as far as any stroke issues.  As long as she has a good right vertebral, her posterior circulation should be fine.  We talked about watching out for symptoms of subclavian steal which she denies.      Relevant Medications   hydrochlorothiazide (HYDRODIURIL) 25 MG tablet   diltiazem (DILACOR XR) 240 MG 24 hr capsule   SVT (supraventricular tachycardia) (HCC) (Chronic)     Question if she ever was in SVT versus a flutter.  Seems like that has resolved since her ablation as well.  Continuing diltiazem.  PRN metoprolol for breakthrough.  She is well familiar with vagal maneuvers as well.       Relevant Medications   hydrochlorothiazide (HYDRODIURIL) 25 MG tablet   diltiazem (DILACOR XR) 240 MG 24 hr capsule   Renal artery stenosis (HCC) (Chronic)    No significant stenosis noted on follow-up Doppler.      Relevant Medications   hydrochlorothiazide (HYDRODIURIL) 25 MG tablet   diltiazem (DILACOR XR) 240 MG 24 hr capsule   Persistent atrial fibrillation (HCC) - s/p Ablation, miantaining NSR (Chronic)    Very few breakthrough episodes since ablation. Unfortunately, her diltiazem was inaccurately recorded here on our medication list and she is actually taking 240 mg daily.  I planned to increase her 240 mg, but she was already on it.  She did not tolerate the higher dose.  Remains on Xarelto.  Will defer to A. fib clinic as for the timing when to stop.      Relevant Medications   hydrochlorothiazide (HYDRODIURIL) 25 MG tablet   diltiazem (DILACOR XR) 240 MG 24 hr capsule   Long term current use of anticoagulant (Chronic)    On Xarelto.  I will defer to A. fib clinic/  Dr. Thompson Grayer to decide how long she needs to be on it.      LBBB (left bundle branch block) (Chronic)    She has a history of rate related LBBB.  No ischemia on Myoview and relatively normal echo back in 2017.Marland Kitchen      Relevant Medications   hydrochlorothiazide (HYDRODIURIL) 25 MG tablet   diltiazem  (DILACOR XR) 240 MG 24 hr capsule   Hyperlipemia, mixed    She is now back on Crestor 10 mg.  I unfortunately do not have any recent labs.      Relevant Medications   hydrochlorothiazide (HYDRODIURIL) 25 MG tablet   diltiazem (DILACOR XR) 240 MG 24 hr capsule   Essential hypertension (Chronic)    Blood pressure is high today which is somewhat unusual for her.My initial plan had been to increase diltiazem dose to 240 mg, but is apparently the dose that she is already on. At this point I am not to make any changes, because it would mean adding a new medication.  Isabella Wright will just have her check a BP log.      Relevant Medications   hydrochlorothiazide (HYDRODIURIL) 25 MG tablet   diltiazem (DILACOR XR) 240 MG 24 hr capsule     I spent a total of 25 to 30 minutes with the  patient. >  50% of the time was with direct patient counseling.  Multiple questions were answered.  Current medicines are reviewed at length with the patient today. (+/- concerns) none The following changes have been made:None  Patient Instructions  MEDICATION INSTRUCTION   -- Ensure that you are taking 40 MG DILTIAZEM  DAILY. Check BP Log.   NO OTHER CHANGES   Your physician wants you to follow-up in [place].You will receive a reminder letter in the mail two months in advance. If you don't receive a letter, please call our office to schedule the follow-up appointment. -- Continue to f/u in Afib Clinic --discuss Xarelto ? With them.   If you need a refill on your cardiac medications before your next appointment, please call your pharmacy.    Studies Ordered:   No orders of the defined types were placed in this encounter.     Glenetta Hew, M.D., M.S. Interventional Cardiologist   Pager # [phone] Phone # [phone] [address]. [place], [place] [address]

## 2017-10-30 NOTE — Patient Instructions (Addendum)
MEDICATION INSTRUCTION   -- Ensure that you are taking 40 MG DILTIAZEM  DAILY. Check BP Log.   NO OTHER CHANGES   Your physician wants you to follow-up in [place].You will receive a reminder letter in the mail two months in advance. If you don't receive a letter, please call our office to schedule the follow-up appointment. -- Continue to f/u in Afib Clinic --discuss Xarelto ? With them.   If you need a refill on your cardiac medications before your next appointment, please call your pharmacy.

## 2017-10-31 ENCOUNTER — Encounter: Payer: Self-pay | Admitting: Cardiology

## 2017-10-31 ENCOUNTER — Telehealth: Payer: Self-pay | Admitting: Cardiology

## 2017-10-31 NOTE — Telephone Encounter (Signed)
New message    Pt c/o medication issue:  1. Name of Medication: diltiazem (DILACOR XR) 240 MG 24 hr capsule  2. How are you currently taking this medication (dosage and times per day)?   3. Are you having a reaction (difficulty breathing--STAT)? no  4. What is your medication issue? Patient wants clarification on dosage

## 2017-10-31 NOTE — Assessment & Plan Note (Signed)
Suspect this may been a congenital defect.  I can insert it was not something to be concerned about as far as any stroke issues.  As long as she has a good right vertebral, her posterior circulation should be fine.  We talked about watching out for symptoms of subclavian steal which she denies.

## 2017-10-31 NOTE — Assessment & Plan Note (Addendum)
Question if she ever was in SVT versus a flutter.  Seems like that has resolved since her ablation as well.  Continuing diltiazem.  PRN metoprolol for breakthrough.  She is well familiar with vagal maneuvers as well.

## 2017-10-31 NOTE — Assessment & Plan Note (Signed)
She is now back on Crestor 10 mg.  I unfortunately do not have any recent labs.

## 2017-10-31 NOTE — Assessment & Plan Note (Signed)
No significant stenosis noted on follow-up Doppler.

## 2017-10-31 NOTE — Telephone Encounter (Signed)
OK - then just stay on 240 mg.   Will need to keep an eye on BP -- may need other options.  Glenetta Hew, MD

## 2017-10-31 NOTE — Assessment & Plan Note (Signed)
She has a history of rate related LBBB.  No ischemia on Myoview and relatively normal echo back in 2017.Marland Kitchen

## 2017-10-31 NOTE — Assessment & Plan Note (Signed)
On Xarelto.  I will defer to A. fib clinic/  Dr. Thompson Grayer to decide how long she needs to be on it.

## 2017-10-31 NOTE — Assessment & Plan Note (Signed)
Very few breakthrough episodes since ablation. Unfortunately, her diltiazem was inaccurately recorded here on our medication list and she is actually taking 240 mg daily.  I planned to increase her 240 mg, but she was already on it.  She did not tolerate the higher dose.  Remains on Xarelto.  Will defer to A. fib clinic as for the timing when to stop.

## 2017-10-31 NOTE — Telephone Encounter (Signed)
Returned call to pt who states Dr. Ellyn Hack increased her Diltiazem to 240 mg from 120 daily on yesterday's visit. Pt states she didn't realize until this morning that she is already currently taking 240 mg and just need clarification on dosage. Routed to Dr. Ellyn Hack and primary nurse.

## 2017-10-31 NOTE — Assessment & Plan Note (Signed)
Blood pressure is high today which is somewhat unusual for her.My initial plan had been to increase diltiazem dose to 240 mg, but is apparently the dose that she is already on. At this point I am not to make any changes, because it would mean adding a new medication.  Meda Coffee will just have her check a BP log.

## 2017-10-31 NOTE — Telephone Encounter (Signed)
SPOKE TO PATIENT . VERBALIZED UNDERSTANDING TO STAY WITH 240 MG DILTIAZEM , RECORD BLOOD PRESSURE FOR NEXT FEW WEEKS  CALL  IN WITH READINGS

## 2017-11-03 ENCOUNTER — Telehealth (HOSPITAL_COMMUNITY): Payer: Self-pay | Admitting: Nurse Practitioner

## 2017-11-03 NOTE — Telephone Encounter (Signed)
Pt called to say that her BP's have been elevated, Saw Dr. Ellyn Hack on Monday and he said to increase Cardizem to 240 mg a day but when she got home, she realized this was her current dose. She took a metoprolol 25 mg this am with BP down to 150 sys and HR is the low 60's. I see her on 4/30. She will try to take 25 mg metoprolol 1/2 tab bid to see if this helps her BP, without causing significant bradycardia. Will re evaluate 4/30

## 2017-11-14 ENCOUNTER — Ambulatory Visit (HOSPITAL_COMMUNITY)
Admission: RE | Admit: 2017-11-14 | Discharge: 2017-11-14 | Disposition: A | Discharge status: Home / Self Care | Payer: Medicare Other | Source: Ambulatory Visit | Attending: Nurse Practitioner | Admitting: Nurse Practitioner

## 2017-11-14 ENCOUNTER — Encounter (HOSPITAL_COMMUNITY): Payer: Self-pay | Admitting: Nurse Practitioner

## 2017-11-14 VITALS — BP 138/80 | HR 71 | Ht 63.0 in | Wt 141.0 lb

## 2017-11-14 DIAGNOSIS — Z9889 Other specified postprocedural states: Secondary | ICD-10-CM | POA: Diagnosis not present

## 2017-11-14 DIAGNOSIS — Z87891 Personal history of nicotine dependence: Secondary | ICD-10-CM | POA: Diagnosis not present

## 2017-11-14 DIAGNOSIS — K219 Gastro-esophageal reflux disease without esophagitis: Secondary | ICD-10-CM | POA: Diagnosis not present

## 2017-11-14 DIAGNOSIS — E119 Type 2 diabetes mellitus without complications: Secondary | ICD-10-CM | POA: Diagnosis not present

## 2017-11-14 DIAGNOSIS — Z7901 Long term (current) use of anticoagulants: Secondary | ICD-10-CM | POA: Insufficient documentation

## 2017-11-14 DIAGNOSIS — Z885 Allergy status to narcotic agent status: Secondary | ICD-10-CM | POA: Diagnosis not present

## 2017-11-14 DIAGNOSIS — Z7984 Long term (current) use of oral hypoglycemic drugs: Secondary | ICD-10-CM | POA: Diagnosis not present

## 2017-11-14 DIAGNOSIS — Z90710 Acquired absence of both cervix and uterus: Secondary | ICD-10-CM | POA: Insufficient documentation

## 2017-11-14 DIAGNOSIS — Z794 Long term (current) use of insulin: Secondary | ICD-10-CM | POA: Diagnosis not present

## 2017-11-14 DIAGNOSIS — I480 Paroxysmal atrial fibrillation: Secondary | ICD-10-CM

## 2017-11-14 DIAGNOSIS — I4891 Unspecified atrial fibrillation: Secondary | ICD-10-CM

## 2017-11-14 DIAGNOSIS — I10 Essential (primary) hypertension: Secondary | ICD-10-CM | POA: Diagnosis present

## 2017-11-14 MED ORDER — METOPROLOL TARTRATE 25 MG PO TABS: 12.5000 mg | ORAL_TABLET | Freq: Two times a day (BID) | ORAL | 2 refills | Status: DC

## 2017-11-14 NOTE — Progress Notes (Signed)
Patient ID: Isabella Wright, female   DOB: 1945/12/13, 72 y.o.   MRN: [id number]     Primary Care Physician: Vivi Barrack, MD Referring Physician: Dr. Jena Gauss Isabella Wright is a 72 y.o. female with a h/o DM, HTN, hypertriglyceridemia, LBBB,previuosly worked up in [place], [place], episodes of tachycardia for many years, but dx as afib 12/2015. She is currently wearing an event monitor. She has had strips showing afib with rvr up to 150-170 bpm. Longest episode reported in EPIC lastest 30 mins. Pt states she was unaware of having afib when the monitor company called to check on her. She is on xarelto with  a chadsvasc score of at least 4. She is a retired Music therapist and moved back to this area in October 18, 2014 from [place], [place], after her husband, a Publishing rights manager, died from [place] in 10-18-2011.He had afib as well and had an ablation by Tally Due, MD. He had previously been on amiodarone, which made him very ill, and tikosyn which controlled the afib and he tolerated well, until it was stopped due to long QT.  She denies tobacco abuse, alcohol use, minimal caffeine. Snores some but does not think to a significant degree. Exercises on a regular basis.Has been on Inderal for years, uses more prn than daily. Is also on diltiazem. Metoprolol daily added.  Returns to afib clinic, 8/4. Event monitor finished and showed 55% afib burden. She is in afib today with rvr. Some days she feels well and other days, she feels short of breath with activities. She enjoys golfing but has refrained this summer for not feeling like she has the energy to play. Recent echo showed normal EF with mildly dilated  left atrium. She had a stress test which was low risk.  F/u 9/8, after consult with Dr. Rayann Heman, she was admitted for tikosyn. She was  loaded on tikosyn and left the hospital on 375 mg bid in SR. Unfortunately, she is in afib today in the afib clininc. She felt really good for the first few days but for the last two days has felt  very poorly with fatigue.  Returns 9/14, she is in [place], but feels that she was in afib most of the am. Has had good days and bad days since [name].  She feels that [name] is not quite doing the job of keeping in [place], and ablation is discussed, which she is interested in and will discuss further with Dr. Rayann Heman 9/27.  Returns to afib clinic 05/2716, one month after ablation and is elated that she feels so well. She did have some afib right after the procedure but has not had any for weeks. She has so much energy. No swallowing or groin issues. Continues on tikosyn and xarelto.  F/u in afib clinic 01/30/17. She feels well.She saw Dr.Allred in April and she was doing well with no afib, with subsequent  reduction in cardizem. She continues to be doing well staying in SR. Continues on xarelto , no bleeding issues.  F/u in afib clinic, 4/30. She is doing very well with no afib. Has been off Tikosyn for some time. Recent increase in BP, called here  for advise and was told to take metoprolol 25 mg 1/2 tab bid which is managing her BP's much better. Was in the 160-170 range.  Today, she denies symptoms of palpitations, chest pain, orthopnea, PND, lower extremity edema, dizziness, presyncope, syncope, or neurologic sequela. Positive for fatigue and shortness of breath at times. The patient  is tolerating medications without difficulties and is otherwise without complaint today.   Past Medical History:  Diagnosis Date  . Colon polyps 2016  . 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 Diabetes mellitus type 2, controlled ([place]) 2012  . GERD (gastroesophageal reflux disease)   . History of hiatal hernia   . Hypertension 2000   2009: Renal Dopplers showed R RA ostial stenosis -- (told not a problem or 20 yrs)  . Hypertriglyceridemia 1996   Very labile and difficult control: Began in '96 when she began taking Premarin after hysterectomy -- triglycerides increased to 3582  neuropathy in her feet> care for by endocrinologis; levels vary based on stress.;; Labs from March 2015: TC 150, TG 745, HDL 26, LDL 45  . Kidney stones   . LBBB (left bundle branch block) 2009   Initially diagnosed as rate related during stress echo.  . Macular degeneration    "dry in right; treating like wet in the left" (03/15/2016)  . Paroxysmal SVT (supraventricular tachycardia) ([place]) 2009   less frequent following I&D of chest wall abscess (Jan 015), not previously documented, may have been afib.  . Personal history of atrial fibrillation 01/04/2016   a. s/p ablation in 04/2016  . Sarcoma ([place]) 2015   dermatofibro sarcoma protuberens left groin  . 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 ->  No evidence of acute cerebral infarct.  Brain MRI shows abnormal appearance of distal left vertebral artery flow.  . Vertebral artery occlusion, left    Noted on MRI/A of the neck in April 2018 --left vertebral artery occluded at origin and reconstitutes at level of V3.  This is similar to what was found on carotid Dopplers. -->  ASYMPTOMATIC   Past Surgical History:  Procedure Laterality Date  . Abdominal Doppler ultrasound  February 2009    Right ostial renal artery stenosis; normal renal structure.  . APPENDECTOMY  1968  . CARDIAC ELECTROPHYSIOLOGY [facility] AND ABLATION  2017  . CYSTOSCOPY W/ URETEROSCOPY W/ LITHOTRIPSY   1999   Large calcium oxalate stone  . CYSTOSCOPY/RETROGRADE/URETEROSCOPY/STONE EXTRACTION WITH BASKET  1972  . ELECTROPHYSIOLOGIC STUDY N/A 05/13/2016   Procedure: Atrial Fibrillation Ablation;  Surgeon: Thompson Grayer, MD;  Location: [facility];  Service: Cardiovascular;  Laterality: N/A;  . EXCISIONAL HEMORRHOIDECTOMY   1981  . FRACTURE SURGERY    . IRRIGATION AND DEBRIDEMENT SEBACEOUS CYST  January 2015    chest wall; with antibiotics  --> following this treatment, SVT episodes became much less frequent.  Marland Kitchen NM MYOVIEW LTD  12/2015   LOW RISK.  No ischemia or infarction.  Small septal-anterior defect likely related to left bundle branch block.  . ORIF PROXIMAL TIBIAL PLATEAU FRACTURE Left 2007   , Related shattered left tibial plateau with reattachment of torn ligaments and insertion of titanium plate and screws.  Marland Kitchen RENAL ARTERY DUPLEX  01/02/2014   R&L RA - mildly elevated velocities - < 60%; Bilateral Kidneys - normal shape & size.  Marland Kitchen SARCOMA EXCISION Left 2015   dermatofibro sarcoma protuberens, wide excision of groin from front side to buttocks"  . SOFT TISSUE BIOPSY Left 2015  . TOTAL ABDOMINAL HYSTERECTOMY  1994  . TRANSTHORACIC ECHOCARDIOGRAM  12/2015   Normal LV size and function.  EF 55%.  Mild LVH.  Septal-lateral dyssynchrony (from LBBB).  No significant valvular abnormalities.  Marland Kitchen TREADMILL STRESS ECHO  01/2009   No regional wall motion abnormalities with stress == non-ischemic; rate related incomplete left bundle branch block    Current Outpatient Medications  Medication Sig Dispense Refill  . ACCU-CHEK AVIVA PLUS test strip 1 EACH BY OTHER ROUTE 4 (FOUR) TIMES DAILY. USE AS INSTRUCTED DIAG E11.65  5  . ACCU-CHEK SOFTCLIX LANCETS lancets 1 EACH BY OTHER ROUTE 3 (THREE) TIMES A DAY.  5  . acetaminophen (TYLENOL) 325 MG tablet Take 2 tablets (650 mg total) by mouth every 4 (four) hours as needed for headache or mild pain.    . cholecalciferol (VITAMIN D) 1000 UNITS tablet Take 2,000 Units by mouth daily.     . Continuous Blood Gluc Sensor (FREESTYLE LIBRE SENSOR SYSTEM) MISC 1 EACH BY DOES NOT APPLY ROUTE EVERY 10 DAYS.  99  . diltiazem (DILACOR XR) 240 MG 24 hr capsule Take 1 capsule (240 mg total) by mouth daily. 30 capsule 11  . Dulaglutide (TRULICITY) 1.61 WR/6.0AV SOPN Inject 0.75 mg into the skin once a week. Saturday    . fenofibrate 160 MG tablet Take 160 mg by mouth daily.     . fluticasone (FLONASE) 50  MCG/ACT nasal spray Place 1-2 sprays into both nostrils at bedtime as needed for allergies or rhinitis.    . hydrochlorothiazide (HYDRODIURIL) 25 MG tablet Take 25 mg by mouth daily.    Vanessa Kick Ethyl 1 g CAPS Take 2 g by mouth 2 (two) times daily. 120 capsule 5  . Insulin Aspart (NOVOLOG FLEXPEN [place]) Inject 10 Units into the skin 3 (three) times daily before meals. If blood sugar is higher than 120 give 10 units before each meal    . Insulin Detemir (LEVEMIR FLEXTOUCH) 100 UNIT/ML Pen Inject 50 Units into the skin at bedtime.     . Magnesium 500 MG CAPS Take 1 capsule by mouth daily.    . Melatonin 5 MG TABS Take by mouth.    . metFORMIN (GLUCOPHAGE-XR) 500 MG 24 hr tablet Take 1 tablet (500 mg total) by mouth daily with breakfast. 1000 MG TAKE IN THE EVENING    . metoprolol tartrate (LOPRESSOR) 25 MG tablet Take 0.5 tablets (12.5 mg total) by mouth 2 (two) times daily. 90 tablet 2  . Multiple Vitamins-Minerals (PRESERVISION AREDS 2 PO) Take 1 tablet by mouth 2 (two) times daily.     Marland Kitchen omeprazole (PRILOSEC OTC) 20 MG tablet Take 20 mg by mouth daily.    . ramipril (ALTACE) 10 MG capsule Take 10 mg by mouth daily.     . rosuvastatin (CRESTOR) 10 MG tablet Take 1 tablet (10 mg total) by mouth daily. 90 tablet 2  . XARELTO 20 MG TABS tablet TAKE 1 TABLET (20 MG TOTAL) BY MOUTH DAILY WITH SUPPER. 90 tablet 1   No current facility-administered medications for this encounter.     Allergies  Allergen Reactions  . Fluorescein Anaphylaxis    Pt states she had swelling of facial tissue with SOB and itching with a rapid heart rate.  . Demerol [Meperidine] Nausea And Vomiting  . Exenatide Nausea And Vomiting  . Liraglutide Nausea And Vomiting    Social History   Socioeconomic History  . Marital status: Widowed    Spouse name: Not on file  . Number of children: Not on file  . Years of education: Not on file  . Highest education level: Not on file  Occupational History  . Not on file  Social Needs  . Financial resource strain: Not on file  . Food insecurity:    Worry: Not on file    Inability: Not on file  . Transportation needs:    Medical: Not on file    Non-medical: Not on file  Tobacco Use  . Smoking status: Former Smoker    Packs/day: 0.50    Years: 15.00    Pack years: 7.50    Types: Cigarettes  . Smokeless tobacco: Never Used  . 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 Alcohol use: No  . Drug use: No  . Sexual activity: Not Currently  Lifestyle  . Physical activity:    Days per week: Not on file    Minutes per session: Not on file  . Stress: Not on file  Relationships  . Social connections:    Talks on phone: Not on file    Gets together: Not on file    Attends religious service: Not on file    Active member of club or organization: Not on file    Attends meetings of clubs or organizations: Not on file    Relationship status: Not on file  . Intimate partner violence:    Fear of current or ex partner: Not on file    Emotionally abused: Not on file    Physically abused: Not on file    Forced sexual activity: Not on file  Other Topics Concern  . 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 She is now widowed for just under 2 years. Her husband died of cancer. They do not have any children.   She recently (spring of 2015) moved to [place] to be closer to her brother, [name] and his family.   She been the caregiver for her ailing husband for 5 years. He passed away in March 30, 2012, and she has had prolonged period of grieving partly due to her having lost 3 members of her immediate family during that time period as well.  --- She is under a lot of stress      She is a former smoker quit years ago. She does not drink alcohol. She is active, but not routine exercise. She says that she tries to eat  healthy and knows what usually affects her triglyceride levels.    Family History  Problem Relation Age of Onset  . Heart disease Brother        Died at 88  . Heart disease Mother        Died at 42  . Heart disease Father        Died at 17  . Breast cancer Sister        Died at 71    ROS- All systems are reviewed and negative except as per the HPI above  Physical Exam: Vitals:   11/14/17 1124  BP: 138/80  Pulse: 71  Weight: 141 lb (64 kg)  Height: 5\' 3"  (1.6 m)    GEN- The patient is well appearing, alert and oriented x 3 today.   Head- normocephalic, atraumatic Eyes-  Sclera clear, conjunctiva pink Ears- hearing intact Oropharynx- clear Neck- supple, no JVP Lymph- no cervical lymphadenopathy Lungs- Clear to ausculation bilaterally, normal work of breathing Heart-regular rate and rhythm, no murmurs, rubs or gallops, PMI not laterally displaced GI- soft, NT, ND, +  BS Extremities- no clubbing, cyanosis, or edema MS- no significant deformity or atrophy Skin- no rash or lesion Psych- euthymic mood, full affect Neuro- strength and sensation are intact  EKG-  SR at 71 bpm, LAD, LBBB, pr int 160 ms, qrs int 142 ms, qtc 473 ms Epic records reviewed  ECHO- 12/2015-Left ventricle: The cavity size was normal. Wall thickness was  increased in a pattern of mild LVH. Systolic function was low  normal to mildly reduced. The estimated ejection fraction was in  the range of 50% to 55%. Septal-lateral dyssynchrony.  Indeterminant diastolic function (atrial fibrillation). - Aortic valve: There was no stenosis. - Mitral valve: There was trivial regurgitation. - Left atrium: The atrium was mildly dilated. - Right ventricle: The cavity size was normal. Systolic function  was normal. - Tricuspid valve: Peak RV-RA gradient (S): 25 mm Hg. - Pulmonary arteries: PA peak pressure: 28 mm Hg (S). - Inferior vena cava: The vessel was normal in size. The  respirophasic diameter changes  were in the normal range (>= 50%),  consistent with normal central venous pressure.     Assessment and Plan: 1. Paroxysmal afib S/p ablation 04/2016 and staying in SR Off tikosyn Continue Cardizem at 120 mg a day  Continue xaretlo for a chadsvasc score of at least 4, last creatinine 0.79 09/18/17 with crcl cal at 65.95.  2. HTN  Much better controlled with addition of metoprolol 25 mg 1/2 tab bid to current antihypertensives    Dr. Allred/ Dr. Ellyn Hack  later this year per recall   Geroge Baseman. [place], [facility] [address] [place], [place] [address] [phone]

## 2017-12-09 ENCOUNTER — Other Ambulatory Visit: Payer: Self-pay | Admitting: Cardiology

## 2017-12-29 ENCOUNTER — Telehealth: Payer: Self-pay | Admitting: Family Medicine

## 2017-12-29 NOTE — Telephone Encounter (Signed)
Would need her to be seen to make sure we are giving her appropriate treatment.  Algis Greenhouse. Jerline Pain, MD 12/29/2017 3:58 PM

## 2017-12-29 NOTE — Telephone Encounter (Signed)
Please advise 

## 2017-12-29 NOTE — Telephone Encounter (Signed)
Copied from [place] [id number]. Topic: Quick Communication - See Telephone Encounter >> Dec 29, 2017  3:10 PM Selinda Flavin B, [place] wrote: CRM for notification. See Telephone encounter for: 12/29/17. Patient calling and states that she has started itching on the back of her head. States that she has tried every OTC cream and nothing has helped. It's just localized to the back of the head. Has taken an antihistamine and it helped some but made her drowsy. Would like to know if something could be sent to the pharmacy for this? Has not used steroid cream. Scratched so much that she is drawing blood at times. CB#: [phone] or [phone] [id number] - ,  - [place]

## 2017-12-29 NOTE — Telephone Encounter (Signed)
See note

## 2017-12-29 NOTE — Telephone Encounter (Signed)
Advised patient.  She verbalized understanding.  Will go to UC or Saturday Clinic.  If not, will come in to be seen on Monday.

## 2017-12-31 ENCOUNTER — Other Ambulatory Visit: Payer: Self-pay | Admitting: Student

## 2018-02-10 ENCOUNTER — Other Ambulatory Visit (HOSPITAL_COMMUNITY): Payer: Self-pay | Admitting: Nurse Practitioner

## 2018-02-27 ENCOUNTER — Other Ambulatory Visit: Payer: Self-pay

## 2018-02-27 MED ORDER — DILTIAZEM HCL ER 240 MG PO CP24: 240.0000 mg | ORAL_CAPSULE | Freq: Every day | ORAL | 9 refills | Status: DC

## 2018-04-13 ENCOUNTER — Telehealth (HOSPITAL_COMMUNITY): Payer: Self-pay | Admitting: *Deleted

## 2018-04-13 LAB — HEMOGLOBIN A1C: Hemoglobin A1C: 7.1

## 2018-04-13 NOTE — Telephone Encounter (Signed)
Patient called in after a fall where she injured her shoulder. This happened several weeks back and was told she could take NSAID for 3 days only. Pt continues to have pain/tenderness and concerned what she should take otc. Told patient she could take tylenol as needed for pain. Encouraged pt to have PCP examine shoulder for further symptom management. Pt verbalized understanding.

## 2018-04-14 LAB — HEPATIC FUNCTION PANEL
ALK PHOS: 97 (ref 25–125)
ALT: 19 (ref 7–35)
AST: 21 (ref 13–35)
Bilirubin, Total: 0.2

## 2018-04-14 LAB — BASIC METABOLIC PANEL
BUN: 14 (ref 4–21)
Creatinine: 0.7 (ref 0.5–1.1)
Glucose: 150
Potassium: 3.9 (ref 3.4–5.3)
Sodium: 140 (ref 137–147)

## 2018-04-14 LAB — LIPID PANEL
Cholesterol: 96 (ref 0–200)
HDL: 22 — AB (ref 35–70)
Triglycerides: 458 — AB (ref 40–160)

## 2018-04-16 ENCOUNTER — Ambulatory Visit (INDEPENDENT_AMBULATORY_CARE_PROVIDER_SITE_OTHER): Payer: Medicare Other | Admitting: Physician Assistant

## 2018-04-16 ENCOUNTER — Encounter: Payer: Self-pay | Admitting: Physician Assistant

## 2018-04-16 VITALS — BP 160/84 | HR 86 | Temp 98.5°F | Ht 63.0 in | Wt 141.0 lb

## 2018-04-16 DIAGNOSIS — M549 Dorsalgia, unspecified: Secondary | ICD-10-CM | POA: Diagnosis not present

## 2018-04-16 MED ORDER — BACLOFEN 10 MG PO TABS: 10.0000 mg | ORAL_TABLET | Freq: Three times a day (TID) | ORAL | 0 refills | Status: DC | PRN

## 2018-04-16 NOTE — Progress Notes (Signed)
Isabella Wright is a 72 y.o. female here for a new problem.  I acted as a Education administrator for [employer], PA-C Anselmo Pickler, LPN  History of Present Illness:   Chief Complaint  Patient presents with  . pain medication    HPI  Pt is here today requesting something for pain. Pt fell a month ago and was sore all over, specifically had pain to R shoulder. Pt is on Xarelto. She called Cardiology and was told she could take Advil for 3 days only. Pt said she is much better, but, still having pain and would like an anti-inflammatory to take that will not interfere with her Xarelto. She denies numbness/tingling/swelling to RUE. She has a history of DM, states that her blood sugars are currently well controlled.  Past Medical History:  Diagnosis Date  . Colon polyps 2016  . 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 Diabetes mellitus type 2, controlled ([place]) 2012  . GERD (gastroesophageal reflux disease)   . History of hiatal hernia   . Hypertension 2000   2009: Renal Dopplers showed R RA ostial stenosis -- (told not a problem or 20 yrs)  . Hypertriglyceridemia 1996   Very labile and difficult control: Began in '96 when she began taking Premarin after hysterectomy -- triglycerides increased to 3582 neuropathy in her feet> care for by endocrinologis; levels vary based on stress.;; Labs from March 2015: TC 150, TG 745, HDL 26, LDL 45  . Kidney stones   . LBBB (left bundle branch block) 2009   Initially diagnosed as rate related during stress echo.  . Macular degeneration    "dry in right; treating like wet in the left" (03/15/2016)  . Paroxysmal SVT (supraventricular tachycardia) ([place]) 2009   less frequent following I&D of chest wall abscess (Jan 015), not previously documented, may have been afib.  . Personal history of atrial fibrillation 01/04/2016   a. s/p ablation in 04/2016  . Sarcoma ([place]) 2015   dermatofibro sarcoma protuberens left groin  . 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 ->  No evidence of acute cerebral infarct.  Brain MRI shows abnormal appearance of distal left vertebral artery flow.  . Vertebral artery occlusion, left    Noted on MRI/A of the neck in April 2018 --left vertebral artery occluded at origin and reconstitutes at level of V3.  This is similar to what was found on carotid Dopplers. -->  ASYMPTOMATIC     Social History   Socioeconomic History  . Marital status: Widowed    Spouse name: Not on file  . Number of children: Not on file  . Years of education: Not on file  . Highest education level: Not on file  Occupational History  . Not on file  Social Needs  . Financial resource strain: Not on file  . Food insecurity:    Worry: Not on file    Inability: Not on file  . Transportation needs:    Medical: Not on file    Non-medical: Not on file  Tobacco Use  . Smoking status: Former Smoker    Packs/day: 0.50    Years: 15.00    Pack years: 7.50    Types: Cigarettes  . Smokeless tobacco: Never Used  . 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 Alcohol use: No  . Drug use: No  . Sexual activity: Not Currently  Lifestyle  . Physical activity:    Days per week: Not on file    Minutes per session: Not on file  . Stress: Not on file  Relationships  . Social connections:    Talks on phone: Not on file    Gets together: Not on file    Attends religious service: Not on file    Active member of club or organization: Not on file    Attends meetings of clubs or organizations: Not on file    Relationship status: Not on file  . Intimate partner violence:    Fear of current or ex partner: Not on file    Emotionally abused: Not on file    Physically abused: Not on file    Forced sexual activity: Not on  file  Other Topics Concern  . 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 She is now widowed for just under 2 years. Her husband died of cancer. They do not have any children.   She recently (spring of 2015) moved to [place] to be closer to her brother, Isabella Wright and his family.   She been the caregiver for her ailing husband for 5 years. He passed away in 2012-03-28, and she has had prolonged period of grieving partly due to her having lost 3 members of her immediate family during that time period as well.  --- She is under a lot of stress      She is a former smoker quit years ago. She does not drink alcohol. She is active, but not routine exercise. She says that she tries to eat healthy and knows what usually affects her triglyceride levels.    Past Surgical History:  Procedure Laterality Date  . Abdominal Doppler ultrasound  February 2009    Right ostial renal artery stenosis; normal renal structure.  . APPENDECTOMY  1968  . CARDIAC ELECTROPHYSIOLOGY [place] AND ABLATION  2017  . CYSTOSCOPY W/ URETEROSCOPY W/ LITHOTRIPSY   1999   Large calcium oxalate stone  . CYSTOSCOPY/RETROGRADE/URETEROSCOPY/STONE EXTRACTION WITH BASKET  1972  . ELECTROPHYSIOLOGIC STUDY N/A 05/13/2016   Procedure: Atrial Fibrillation Ablation;  Surgeon: Thompson Grayer, MD;  Location: [facility];  Service: Cardiovascular;  Laterality: N/A;  . EXCISIONAL HEMORRHOIDECTOMY   1981  . FRACTURE SURGERY    . IRRIGATION AND DEBRIDEMENT SEBACEOUS CYST  January 2015    chest wall; with antibiotics --> following this treatment, SVT episodes became much less frequent.  Marland Kitchen NM MYOVIEW LTD  12/2015   LOW RISK.  No ischemia or infarction.  Small septal-anterior defect likely related to left bundle branch block.  . ORIF PROXIMAL TIBIAL PLATEAU FRACTURE Left 2007   , Related shattered left tibial plateau with reattachment of torn ligaments and insertion of  titanium plate and screws.  Marland Kitchen RENAL ARTERY DUPLEX  01/02/2014   R&L RA - mildly elevated velocities - < 60%; Bilateral Kidneys - normal shape & size.  Marland Kitchen SARCOMA EXCISION Left 2015   dermatofibro sarcoma protuberens, wide excision of groin from front side to buttocks"  . SOFT TISSUE BIOPSY Left 2015  . TOTAL ABDOMINAL HYSTERECTOMY  1994  . TRANSTHORACIC ECHOCARDIOGRAM  12/2015   Normal LV size and function.  EF 55%.  Mild LVH.  Septal-lateral dyssynchrony (from LBBB).  No significant valvular abnormalities.  Marland Kitchen TREADMILL STRESS ECHO  01/2009   No regional wall motion abnormalities with stress == non-ischemic; rate related incomplete left bundle branch block    Family History  Problem Relation Age of Onset  . Heart disease Brother        Died at 64  . Heart disease Mother        Died at 65  . Heart disease Father        Died at 40  . Breast cancer Sister        Died at 3    Allergies  Allergen Reactions  . Fluorescein Anaphylaxis    Pt states she had swelling of facial tissue with SOB and itching with a rapid heart rate.  . Demerol [Meperidine] Nausea And Vomiting  . Exenatide Nausea And Vomiting  . Liraglutide Nausea And Vomiting    Current Medications:   Current Outpatient Medications:  .  ACCU-CHEK AVIVA PLUS test strip, 1 EACH BY OTHER ROUTE 4 (FOUR) TIMES DAILY. USE AS INSTRUCTED DIAG E11.65, Disp: , Rfl: 5 .  ACCU-CHEK SOFTCLIX LANCETS lancets, 1 EACH BY OTHER ROUTE 3 (THREE) TIMES A DAY., Disp: , Rfl: 5 .  acetaminophen (TYLENOL) 325 MG tablet, Take 2 tablets (650 mg total) by mouth every 4 (four) hours as needed for headache or mild pain., Disp: , Rfl:  .  cholecalciferol (VITAMIN D) 1000 UNITS tablet, Take 2,000 Units by mouth daily. , Disp: , Rfl:  .  Continuous Blood Gluc Sensor (FREESTYLE LIBRE SENSOR SYSTEM) MISC, 1 EACH BY DOES NOT APPLY ROUTE EVERY 10 DAYS., Disp: , Rfl: 99 .  diltiazem (DILACOR XR) 240 MG 24 hr capsule, Take 1 capsule (240 mg total) by mouth  daily., Disp: 30 capsule, Rfl: 9 .  Dulaglutide (TRULICITY) 0.97 DZ/3.2DJ SOPN, Inject 0.75 mg into the skin once a week. Saturday, Disp: , Rfl:  .  fenofibrate 160 MG tablet, Take 160 mg by mouth daily. , Disp: , Rfl:  .  fluticasone (FLONASE) 50 MCG/ACT nasal spray, Place 1-2 sprays into both nostrils at bedtime as needed for allergies or rhinitis., Disp: , Rfl:  .  hydrochlorothiazide (HYDRODIURIL) 25 MG tablet, TAKE 1 TABLET BY MOUTH EVERY DAY, Disp: 90 tablet, Rfl: 1 .  Icosapent Ethyl 1 g CAPS, Take 2 g by mouth 2 (two) times daily., Disp: 120 capsule, Rfl: 5 .  Insulin Aspart (NOVOLOG FLEXPEN [place]), Inject 10 Units into the skin 3 (three) times daily before meals. If blood sugar is higher than 120 give 10 units before each meal, Disp: , Rfl:  .  Insulin Detemir (LEVEMIR FLEXTOUCH) 100 UNIT/ML Pen, Inject 50 Units into the skin at bedtime. , Disp: , Rfl:  .  metFORMIN (GLUCOPHAGE-XR) 500 MG 24 hr tablet, Take 1 tablet (500 mg total) by mouth daily with breakfast. 1000 MG TAKE IN THE EVENING, Disp: , Rfl:  .  metoprolol tartrate (LOPRESSOR) 25 MG tablet, Take 0.5 tablets (12.5 mg total) by mouth 2 (two) times daily., Disp: 90 tablet, Rfl: 2 .  Multiple Vitamins-Minerals (PRESERVISION AREDS 2 PO), Take 1 tablet by mouth 2 (two) times daily. , Disp: , Rfl:  .  omeprazole (PRILOSEC OTC) 20 MG tablet, Take 20 mg by mouth daily., Disp: , Rfl:  .  ramipril (ALTACE) 10 MG capsule, Take 10 mg by mouth daily. , Disp: , Rfl:  .  rosuvastatin (CRESTOR) 10 MG tablet, TAKE 1 TABLET BY MOUTH EVERY DAY, Disp: 90 tablet, Rfl: 1 .  XARELTO 20 MG TABS tablet, TAKE 1 TABLET (20 MG TOTAL) BY MOUTH DAILY WITH SUPPER., Disp: 90 tablet, Rfl: 1 .  baclofen (LIORESAL) 10 MG tablet, Take 1 tablet (10 mg total) by mouth 3 (three) times daily as needed for muscle spasms., Disp: 30 each, Rfl: 0   Review of Systems:   ROS  Negative unless otherwise specified per HPI.  Vitals:   Vitals:   04/16/18 1603  BP: (!) 160/84   Pulse: 86  Temp: 98.5 F (36.9 C)  TempSrc: Oral  SpO2: 96%  Weight: 141 lb (64 kg)  Height: 5\' 3"  (1.6 m)     Body mass index is 24.98 kg/m.  Physical Exam:   Physical Exam  Constitutional: She appears well-developed. She is cooperative.  Non-toxic appearance. She does not have a sickly appearance. She does not appear ill. No distress.  Cardiovascular: Normal rate, regular rhythm, S1 normal, S2 normal, normal heart sounds and normal pulses.  No LE edema  Pulmonary/Chest: Effort normal and breath sounds normal.  Musculoskeletal:  R upper thoracic paraspinal tenderness with deep palpation. No decreased ROM noted on exam. No decreased grip strength noted bilaterally.  Neurological: She is alert. GCS eye subscore is 4. GCS verbal subscore is 5. GCS motor subscore is 6.  Skin: Skin is warm, dry and intact.  Psychiatric: She has a normal mood and affect. Her speech is normal and behavior is normal.  Nursing note and vitals reviewed.   Assessment and Plan:    Bettyann was seen today for pain medication.  Diagnoses and all orders for this visit:  Upper back pain on right side  Other orders -     baclofen (LIORESAL) 10 MG tablet; Take 1 tablet (10 mg total) by mouth 3 (three) times daily as needed for muscle spasms.   No red flags on exam. No indications for imaging at this time. She did not hit her head or lose consciousness when she fell. Residual upper R back pain lingering. Responded well to anti-inflammatories. Gave patient a Pennsaid sample. I discussed that if she uses the Pennsaid and finds relief from it, to call our office and let us know and we can send in a prescription. I also did provide her with a brief prescription of Baclofen to use for muscle spasms. I recommended that if her symptoms do not improve, she should make an appointment to see Dr. Paulla Fore for possible further work-up. Patient verbalized understanding.  . Reviewed expectations re: course of current medical  issues. . Discussed self-management of symptoms. . Outlined signs and symptoms indicating need for more acute intervention. . Patient verbalized understanding and all questions were answered. . See orders for this visit as documented in the electronic medical record. . Patient received an After-Visit Summary.  CMA or LPN served as scribe during this visit. History, Physical, and Plan performed by medical provider. The above documentation has been reviewed and is accurate and complete.  Inda Coke, PA-C

## 2018-04-16 NOTE — Patient Instructions (Signed)
It was great to see you!  1. May use the topical pennsaid sample for your pain. If this is helpful, let me know and we can send in a refill.  2. May use baclofen as needed for muscle spasms. Medication may make you sleepy, so take the first dose at home.  Let's follow-up with Dr. Paulla Fore here at our office if symptoms persist, sooner if you have concerns.  Take care,  Inda Coke PA-C

## 2018-04-27 LAB — HM DIABETES EYE EXAM

## 2018-05-09 ENCOUNTER — Encounter: Payer: Self-pay | Admitting: Internal Medicine

## 2018-05-09 ENCOUNTER — Ambulatory Visit (INDEPENDENT_AMBULATORY_CARE_PROVIDER_SITE_OTHER): Payer: Medicare Other | Admitting: Internal Medicine

## 2018-05-09 ENCOUNTER — Other Ambulatory Visit: Payer: Self-pay

## 2018-05-09 VITALS — BP 152/76 | HR 79 | Ht 63.0 in | Wt 143.2 lb

## 2018-05-09 DIAGNOSIS — I701 Atherosclerosis of renal artery: Secondary | ICD-10-CM

## 2018-05-09 DIAGNOSIS — I10 Essential (primary) hypertension: Secondary | ICD-10-CM | POA: Diagnosis not present

## 2018-05-09 DIAGNOSIS — I480 Paroxysmal atrial fibrillation: Secondary | ICD-10-CM

## 2018-05-09 MED ORDER — RAMIPRIL 10 MG PO CAPS: 20.0000 mg | ORAL_CAPSULE | Freq: Every day | ORAL | 3 refills | Status: DC

## 2018-05-09 NOTE — Patient Instructions (Addendum)
Medication Instructions:  Your physician has recommended you make the following change in your medication:   1.  Increase your Altace 10 mg--- Take 2 tablets by mouth daily.  If you need a refill on your cardiac medications before your next appointment, please call your pharmacy.   Lab work: Please get a BMP at your PCP  If you have labs (blood work) drawn today and your tests are completely normal, you will receive your results only by: Marland Kitchen MyChart Message (if you have MyChart) OR . A paper copy in the mail If you have any lab test that is abnormal or we need to change your treatment, we will call you to review the results.  Testing/Procedures: None ordered.  Follow-Up:  Your physician wants you to follow-up in: 6 months with Roderic Palau NP at the afib clinic.  You will receive a reminder letter in the mail two months in advance. If you don't receive a letter, please call our office to schedule the follow-up appointment.   Your physician wants you to follow-up in: one year with Dr. Rayann Heman.   You will receive a reminder letter in the mail two months in advance. If you don't receive a letter, please call our office to schedule the follow-up appointment.  At [facility], you and your health needs are our priority.  As part of our continuing mission to provide you with exceptional heart care, we have created designated Provider Care Teams.  These Care Teams include your primary Cardiologist (physician) and Advanced Practice Providers (APPs -  Physician Assistants and Nurse Practitioners) who all work together to provide you with the care you need, when you need it.  Any Other Special Instructions Will Be Listed Below (If Applicable).

## 2018-05-09 NOTE — Progress Notes (Signed)
PCP: Vivi Barrack, MD Primary Cardiologist: Dr Ellyn Hack Primary EP: Dr Kenn File Isabella Wright is a 72 y.o. female who presents today for routine electrophysiology followup.  Since last being seen in our clinic, the patient reports doing very well.  Today, she denies symptoms of palpitations, chest pain, shortness of breath,  lower extremity edema, dizziness, presyncope, or syncope.  The patient is otherwise without complaint today.   Past Medical History:  Diagnosis Date  . Colon polyps 2016  . 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 Diabetes mellitus type 2, controlled ([place]) 2012  . GERD (gastroesophageal reflux disease)   . History of hiatal hernia   . Hypertension 2000   2009: Renal Dopplers showed R RA ostial stenosis -- (told not a problem or 20 yrs)  . Hypertriglyceridemia 1996   Very labile and difficult control: Began in '96 when she began taking Premarin after hysterectomy -- triglycerides increased to 3582 neuropathy in her feet> care for by endocrinologis; levels vary based on stress.;; Labs from March 2015: TC 150, TG 745, HDL 26, LDL 45  . Kidney stones   . LBBB (left bundle branch block) 2009   Initially diagnosed as rate related during stress echo.  . Macular degeneration    "dry in right; treating like wet in the left" (03/15/2016)  . Paroxysmal SVT (supraventricular tachycardia) ([place]) 2009   less frequent following I&D of chest wall abscess (Jan 015), not previously documented, may have been afib.  . Personal history of atrial fibrillation 01/04/2016   a. s/p ablation in 04/2016  . Sarcoma ([place]) 2015   dermatofibro sarcoma protuberens left groin  . 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 ->  No evidence of acute cerebral infarct.  Brain MRI shows abnormal appearance of distal left vertebral artery flow.    . Vertebral artery occlusion, left    Noted on MRI/A of the neck in April 2018 --left vertebral artery occluded at origin and reconstitutes at level of V3.  This is similar to what was found on carotid Dopplers. -->  ASYMPTOMATIC   Past Surgical History:  Procedure Laterality Date  . Abdominal Doppler ultrasound  February 2009    Right ostial renal artery stenosis; normal renal structure.  . APPENDECTOMY  1968  . CARDIAC ELECTROPHYSIOLOGY [place] AND ABLATION  2017  . CYSTOSCOPY W/ URETEROSCOPY W/ LITHOTRIPSY   1999   Large calcium oxalate stone  . CYSTOSCOPY/RETROGRADE/URETEROSCOPY/STONE EXTRACTION WITH BASKET  1972  . ELECTROPHYSIOLOGIC STUDY N/A 05/13/2016   Procedure: Atrial Fibrillation Ablation;  Surgeon: Thompson Grayer, MD;  Location: [facility];  Service: Cardiovascular;  Laterality: N/A;  . EXCISIONAL HEMORRHOIDECTOMY   1981  . FRACTURE SURGERY    . IRRIGATION AND DEBRIDEMENT SEBACEOUS CYST  January 2015    chest wall; with antibiotics --> following this treatment, SVT episodes became much less frequent.  Marland Kitchen NM MYOVIEW LTD  12/2015   LOW RISK.  No ischemia or infarction.  Small septal-anterior defect likely related to left bundle branch block.  . ORIF PROXIMAL TIBIAL PLATEAU FRACTURE Left 2007   , Related shattered left tibial plateau with reattachment of torn ligaments and insertion of titanium plate and screws.  Marland Kitchen RENAL ARTERY DUPLEX  01/02/2014   R&L RA - mildly elevated velocities - < 60%; Bilateral Kidneys - normal shape &  size.  Marland Kitchen SARCOMA EXCISION Left 2015   dermatofibro sarcoma protuberens, wide excision of groin from front side to buttocks"  . SOFT TISSUE BIOPSY Left 2015  . TOTAL ABDOMINAL HYSTERECTOMY  1994  . TRANSTHORACIC ECHOCARDIOGRAM  12/2015   Normal LV size and function.  EF 55%.  Mild LVH.  Septal-lateral dyssynchrony (from LBBB).  No significant valvular abnormalities.  Marland Kitchen TREADMILL STRESS ECHO  01/2009   No regional wall motion abnormalities with  stress == non-ischemic; rate related incomplete left bundle branch block    ROS- all systems are reviewed and negatives except as per HPI above  Current Outpatient Medications  Medication Sig Dispense Refill  . ACCU-CHEK AVIVA PLUS test strip 1 EACH BY OTHER ROUTE 4 (FOUR) TIMES DAILY. USE AS INSTRUCTED DIAG E11.65  5  . ACCU-CHEK SOFTCLIX LANCETS lancets 1 EACH BY OTHER ROUTE 3 (THREE) TIMES A DAY.  5  . acetaminophen (TYLENOL) 325 MG tablet Take 2 tablets (650 mg total) by mouth every 4 (four) hours as needed for headache or mild pain.    . baclofen (LIORESAL) 10 MG tablet Take 1 tablet (10 mg total) by mouth 3 (three) times daily as needed for muscle spasms. 30 each 0  . cholecalciferol (VITAMIN D) 1000 UNITS tablet Take 2,000 Units by mouth daily.     . Continuous Blood Gluc Sensor (FREESTYLE LIBRE SENSOR SYSTEM) MISC 1 EACH BY DOES NOT APPLY ROUTE EVERY 10 DAYS.  99  . diltiazem (DILACOR XR) 240 MG 24 hr capsule Take 1 capsule (240 mg total) by mouth daily. 30 capsule 9  . Dulaglutide (TRULICITY) 7.98 XQ/1.1HE SOPN Inject 0.75 mg into the skin once a week. Saturday    . fenofibrate 160 MG tablet Take 160 mg by mouth daily.     . fluticasone (FLONASE) 50 MCG/ACT nasal spray Place 1-2 sprays into both nostrils at bedtime as needed for allergies or rhinitis.    . hydrochlorothiazide (HYDRODIURIL) 25 MG tablet TAKE 1 TABLET BY MOUTH EVERY DAY 90 tablet 1  . Icosapent Ethyl 1 g CAPS Take 2 g by mouth 2 (two) times daily. 120 capsule 5  . Insulin Aspart (NOVOLOG FLEXPEN [place]) Inject 10 Units into the skin 3 (three) times daily before meals. If blood sugar is higher than 120 give 10 units before each meal    . Insulin Detemir (LEVEMIR FLEXTOUCH) 100 UNIT/ML Pen Inject 50 Units into the skin at bedtime.     . metFORMIN (GLUCOPHAGE-XR) 500 MG 24 hr tablet Take 1 tablet (500 mg total) by mouth daily with breakfast. 1000 MG TAKE IN THE EVENING    . metoprolol tartrate (LOPRESSOR) 25 MG tablet Take 0.5  tablets (12.5 mg total) by mouth 2 (two) times daily. 90 tablet 2  . Multiple Vitamins-Minerals (PRESERVISION AREDS 2 PO) Take 1 tablet by mouth 2 (two) times daily.     Marland Kitchen omeprazole (PRILOSEC OTC) 20 MG tablet Take 20 mg by mouth daily.    . ramipril (ALTACE) 10 MG capsule Take 10 mg by mouth daily.     . rosuvastatin (CRESTOR) 10 MG tablet TAKE 1 TABLET BY MOUTH EVERY DAY 90 tablet 1  . XARELTO 20 MG TABS tablet TAKE 1 TABLET (20 MG TOTAL) BY MOUTH DAILY WITH SUPPER. 90 tablet 1   No current facility-administered medications for this visit.     Physical Exam: Vitals:   05/09/18 1645  BP: (!) 152/76  Pulse: 79  SpO2: 96%  Weight: 143 lb 3.2 oz (65 kg)  Height: 5\' 3"  (1.6 m)    GEN- The patient is well appearing, alert and oriented x 3 today.   Head- normocephalic, atraumatic Eyes-  Sclera clear, conjunctiva pink Ears- hearing intact Oropharynx- clear Lungs- Clear to ausculation bilaterally, normal work of breathing Heart- Regular rate and rhythm, no murmurs, rubs or gallops, PMI not laterally displaced GI- soft, NT, ND, + BS Extremities- no clubbing, cyanosis, or edema  Wt Readings from Last 3 Encounters:  05/09/18 143 lb 3.2 oz (65 kg)  04/16/18 141 lb (64 kg)  11/14/17 141 lb (64 kg)    EKG tracing ordered today is personally reviewed and shows sinus rhythm with LBBB  Assessment and Plan:  1. Persistent atrial fibrillation Doing very well post ablation off AAD therapy chads2vasc score is 4.  Continue xarelto  2. HTN Elevated today and at home Increase ramipril to 20mg  daily Follow-up with Dr Ellyn Hack and PCP Will need bmet on follow-up with PCP in december  3. CAD Medical management Followed by Dr Ellyn Hack  Return in 6 months to see Butch Penny in AF clinic Return here in a year Follow-up with Dr Ellyn Hack as scheduled  Thompson Grayer MD, [facility] 05/09/2018 5:01 PM

## 2018-06-06 ENCOUNTER — Other Ambulatory Visit: Payer: Self-pay | Admitting: Cardiology

## 2018-06-18 ENCOUNTER — Ambulatory Visit (INDEPENDENT_AMBULATORY_CARE_PROVIDER_SITE_OTHER): Payer: Medicare Other | Admitting: Family Medicine

## 2018-06-18 ENCOUNTER — Encounter: Payer: Self-pay | Admitting: Family Medicine

## 2018-06-18 ENCOUNTER — Other Ambulatory Visit: Payer: Self-pay | Admitting: Student

## 2018-06-18 ENCOUNTER — Encounter: Payer: Medicare Other | Admitting: Family Medicine

## 2018-06-18 ENCOUNTER — Other Ambulatory Visit: Payer: Self-pay

## 2018-06-18 VITALS — BP 149/76 | HR 68 | Temp 98.5°F | Ht 63.0 in | Wt 138.0 lb

## 2018-06-18 DIAGNOSIS — I10 Essential (primary) hypertension: Secondary | ICD-10-CM

## 2018-06-18 DIAGNOSIS — R200 Anesthesia of skin: Secondary | ICD-10-CM

## 2018-06-18 DIAGNOSIS — Z7189 Other specified counseling: Secondary | ICD-10-CM

## 2018-06-18 MED ORDER — OMEPRAZOLE 20 MG PO CPDR: 20.0000 mg | DELAYED_RELEASE_CAPSULE | Freq: Every day | ORAL | 3 refills | Status: DC

## 2018-06-18 MED ORDER — AMLODIPINE BESYLATE 5 MG PO TABS: 5.0000 mg | ORAL_TABLET | Freq: Every day | ORAL | 5 refills | Status: DC

## 2018-06-18 NOTE — Progress Notes (Signed)
   Subjective:  Isabella Wright is a 72 y.o. female who presents today with a chief complaint of arm numbness.   HPI:  Arm Numbness, new problem Symptoms started about 2 months ago.  She fell down steps in her home and had pain to her right shoulder.  She was evaluated here and started on baclofen and Pennsaid.  Her pain is improved however she still has a small patch of numbness to the lateral aspect of her right arm.  Symptoms seem to be improving.  No weakness or numbness in her hands or finger.  No loss of fine motor control.  Hypertension, chronic problem, uncontrolled Currently on diltiazem 240 mg daily, HCTZ 25 mg daily, and metoprolol tartrate 25 mg once daily.  She recently saw her cardiologist who increased her ramipril from 10 to 20 mg daily.  She is compliant with all his medications without side effects.  No reported chest pain or shortness of breath.  Her home blood pressure readings have been elevated to the 150s to 160s and occasionally into the 180s.  T2DM Follows with endocrinology for this.  Last A1c was 7.1.  ROS: Per HPI  PMH: She reports that she has quit smoking. Her smoking use included cigarettes. She has a 7.50 pack-year smoking history. She has never used smokeless tobacco. She reports that she does not drink alcohol or use drugs.  Objective:  Physical Exam: BP (!) 149/76 (BP Location: Left Arm, Patient Position: Sitting, Cuff Size: Normal)   Pulse 68   Temp 98.5 F (36.9 C) (Oral)   Ht 5\' 3"  (1.6 m)   Wt 138 lb (62.6 kg)   SpO2 98%   BMI 24.45 kg/m   Gen: NAD, resting comfortably CV: RRR with no murmurs appreciated Pulm: NWOB, CTAB with no crackles, wheezes, or rhonchi GI: Normal bowel sounds present. Soft, Nontender, Nondistended. MSK:  -Neck: No deformities.  Nontender to palpation.  Full range of motion in all directions.  Spurling test negative bilaterally. - Arm: No deformities.  Nontender to palpation.  Strength 5 out of 5 in all directions.  Superior  lateral aspect of right upper arm.  Neurovascularly intact distally. Skin: Warm, dry Neuro: Grossly normal, moves all extremities Psych: Normal affect and thought content   Assessment/Plan:  Essential hypertension Above goal today.  Start Norvasc 5 mg daily.  Continue diltiazem 240 mg daily and metoprolol tartrate 25 mg once daily.  Also continue ramipril 20 mg daily.  Discussed that Toprol tartrate was usually twice daily dosing, however patient stated she did not think she will be able to tolerate taking this medication twice daily.  Discussed home blood pressure monitoring and importance of low-salt diet and regular exercise.  Discussed reasons return to care.  Right arm numbness No signs of radiculopathy or brachial plexus injury.  She has no signs of weakness on physical exam.  Symptoms most likely due to injury to a cutaneous nerve.  Hopefully this will improve over the next few weeks to months.  Discussed reasons to return to care and seek emergent care.  Preventive health care Up-to-date on flu vaccine.  Encourage patient to get shingles vaccine.  She is up-to-date on mammogram and colonoscopy as well.  Time Spent: I spent >40 minutes face-to-face with the patient, with more than half spent on counseling for management plan for her hypertension, or numbness, and preventative healthcare.   Algis Greenhouse. Jerline Pain, MD 06/18/2018 2:31 PM

## 2018-06-18 NOTE — Assessment & Plan Note (Signed)
Above goal today.  Start Norvasc 5 mg daily.  Continue diltiazem 240 mg daily and metoprolol tartrate 25 mg once daily.  Also continue ramipril 20 mg daily.  Discussed that Toprol tartrate was usually twice daily dosing, however patient stated she did not think she will be able to tolerate taking this medication twice daily.  Discussed home blood pressure monitoring and importance of low-salt diet and regular exercise.  Discussed reasons return to care.

## 2018-06-18 NOTE — Patient Instructions (Signed)
It was very nice to see you today!  We will start norvasc 5mg  daily. Plesae let me know if your blood pressure does not improve or if you have side effects.  The numbness in your arm is from an irritated nerve. This should improve over the next few months.  Keep up the good work!  Come back to see me in 6-12 months, or sooner as needed.   Take care, Dr Jerline Pain

## 2018-06-22 ENCOUNTER — Encounter: Payer: Medicare Other | Admitting: Family Medicine

## 2018-07-04 ENCOUNTER — Other Ambulatory Visit: Payer: Self-pay | Admitting: Cardiology

## 2018-07-06 ENCOUNTER — Encounter: Payer: Self-pay | Admitting: Physical Therapy

## 2018-07-18 IMAGING — CT CT HEART MORPH/PULM VEIN W/ CM & W/O CA SCORE
1 of 14 series · 1 of 20 positions shown, 2 images · IV contrast (Iodine)
Comparison: None.

CLINICAL DATA: Atrial fibrillation scheduled for an ablation.

EXAM:
Cardiac CT/CTA
TECHNIQUE: The patient was scanned on a Siemens Somatom scanner.

[Series 300: locator · axial · 0.35mm/px · z∈[-126,-126]mm · 1 of 1 slices shown, 2 images]
[im 1/1  vessel]
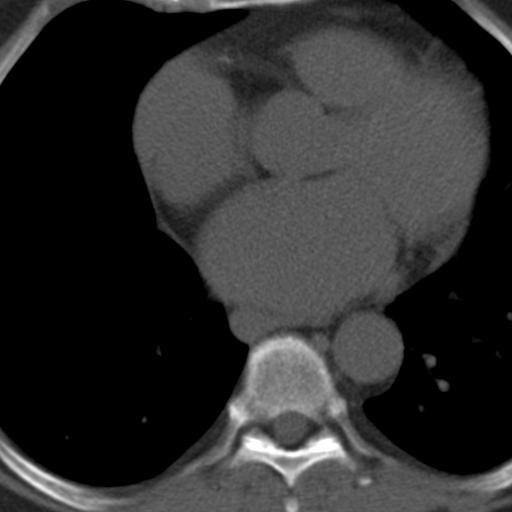
[im 1/1  lung]
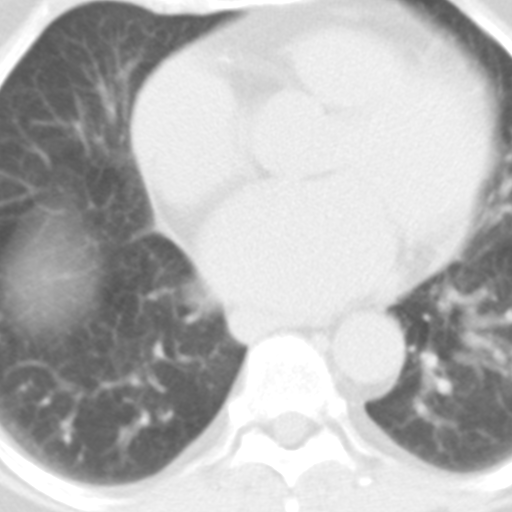

[1 of 20 positions shown; findings below may reference images not displayed]

FINDINGS: A 120 kV prospective scan was triggered in the descending thoracic
aorta at 111 HU's. Gantry rotation speed was 280 msecs and
collimation was .9 mm. 10 mg of iv Metoprolol and no NTG was given.
The 3D data set was reconstructed in 5% intervals of the 60-80 % of
the R-R cycle. Diastolic phases were analyzed on a dedicated work
station using MPR, MIP and VRT modes. The patient received 80 cc of
contrast.

There is normal pulmonary vein drainage into the left atrium (2 on
the right and 2 on the left) with ostial measurements as follows:

RUPV:  23 x 19 mm

RLPV:  19 x 16 mm

LUPV:  16 x 14 mm

LLPV:  16 x 11 mm

The left atrial appendage is large - mixed chicken wing / broccoli
type with multiple small lobes and ostial size 27 x 24 mm and length
37 mm. There is no thrombus in the left atrial appendage.

The esophagus runs to the left from the left atrial midline and is
in the proximity to the LUPV and LLPV.

Aorta:  Normal caliber.  No dissection or calcifications.

Aortic Valve:  Trileaflet.  Trivial calcifications.

Coronary Arteries: Normal coronary origin. Right dominance. The
study was performed without use of NTG and insufficient for plaque
evaluation.

Dilated pulmonary artery measuring 34 x 30 mm suggestive of
pulmonary hypertension.
IMPRESSION: 1. There is normal pulmonary vein drainage into the left atrium. No
evidence of pulmonary stenosis.

2. The left atrial appendage is large - mixed chicken wing /
broccoli type with multiple small lobes and ostial size 27 x 24 mm
and length 37 mm. There is no thrombus in the left atrial appendage.

3. The esophagus runs to the left from the left atrial midline and
is in the proximity to the LUPV and LLPV.

4. Dilated pulmonary artery measuring 34 x 30 mm suggestive of
pulmonary hypertension.

Munadir Yonghang

EXAM:
OVER-READ INTERPRETATION  CT CHEST

The following report is an over-read performed by radiologist Dr.
does not include interpretation of cardiac or coronary anatomy or
pathology. The coronary calcium score/coronary CTA interpretation by
the cardiologist is attached.
FINDINGS: Mediastinum: Aortic atherosclerosis. No dissection. No central
pulmonary embolism, on this non-dedicated study.

No imaged thoracic adenopathy.

Lungs/Pleura: No pleural fluid.  Clear imaged lungs.

Visualized Abdomen: Mild hepatic steatosis. Normal imaged portions
of the spleen, stomach, adrenal glands.

Musculoskeletal: No acute osseous abnormality.
IMPRESSION: 1. No acute extracardiac process within the imaged chest.
2.  Aortic atherosclerosis.
3. Hepatic steatosis.

## 2018-07-23 ENCOUNTER — Other Ambulatory Visit: Payer: Self-pay | Admitting: Student

## 2018-07-23 NOTE — Telephone Encounter (Signed)
This is Dr. Harding's pt. °

## 2018-07-24 DIAGNOSIS — H43813 Vitreous degeneration, bilateral: Secondary | ICD-10-CM | POA: Diagnosis not present

## 2018-07-24 DIAGNOSIS — H353114 Nonexudative age-related macular degeneration, right eye, advanced atrophic with subfoveal involvement: Secondary | ICD-10-CM | POA: Diagnosis not present

## 2018-07-24 DIAGNOSIS — H2513 Age-related nuclear cataract, bilateral: Secondary | ICD-10-CM | POA: Diagnosis not present

## 2018-07-24 DIAGNOSIS — H353221 Exudative age-related macular degeneration, left eye, with active choroidal neovascularization: Secondary | ICD-10-CM | POA: Diagnosis not present

## 2018-07-24 NOTE — Telephone Encounter (Signed)
Rx request sent to pharmacy.  

## 2018-08-03 ENCOUNTER — Other Ambulatory Visit: Payer: Self-pay | Admitting: Cardiology

## 2018-08-18 ENCOUNTER — Other Ambulatory Visit: Payer: Self-pay | Admitting: Cardiology

## 2018-08-20 NOTE — Telephone Encounter (Signed)
Rx request sent to pharmacy.  

## 2018-08-26 ENCOUNTER — Other Ambulatory Visit (HOSPITAL_COMMUNITY): Payer: Self-pay | Admitting: Nurse Practitioner

## 2018-08-31 ENCOUNTER — Ambulatory Visit (INDEPENDENT_AMBULATORY_CARE_PROVIDER_SITE_OTHER): Payer: Medicare Other | Admitting: Cardiology

## 2018-08-31 ENCOUNTER — Encounter: Payer: Self-pay | Admitting: Cardiology

## 2018-08-31 DIAGNOSIS — I701 Atherosclerosis of renal artery: Secondary | ICD-10-CM

## 2018-08-31 DIAGNOSIS — I447 Left bundle-branch block, unspecified: Secondary | ICD-10-CM

## 2018-08-31 DIAGNOSIS — E782 Mixed hyperlipidemia: Secondary | ICD-10-CM | POA: Diagnosis not present

## 2018-08-31 DIAGNOSIS — I6502 Occlusion and stenosis of left vertebral artery: Secondary | ICD-10-CM

## 2018-08-31 DIAGNOSIS — I4819 Other persistent atrial fibrillation: Secondary | ICD-10-CM | POA: Diagnosis not present

## 2018-08-31 DIAGNOSIS — I4891 Unspecified atrial fibrillation: Secondary | ICD-10-CM

## 2018-08-31 DIAGNOSIS — I10 Essential (primary) hypertension: Secondary | ICD-10-CM | POA: Diagnosis not present

## 2018-08-31 MED ORDER — METOPROLOL TARTRATE 25 MG PO TABS: ORAL_TABLET | ORAL | 3 refills | Status: DC

## 2018-08-31 NOTE — Patient Instructions (Addendum)
Medication Instructions:   Take  Metoprolol tartrate 25 mg  One tablet in the morning only.   Take diltiazem 240 mg  in the afternoon.  If you need a refill on your cardiac medications before your next appointment, please call your pharmacy.   Lab work: Not needed If you have labs (blood work) drawn today and your tests are completely normal, you will receive your results only by: Marland Kitchen MyChart Message (if you have MyChart) OR . A paper copy in the mail If you have any lab test that is abnormal or we need to change your treatment, we will call you to review the results.  Testing/Procedures: Not needed  Follow-Up: At [facility], you and your health needs are our priority.  As part of our continuing mission to provide you with exceptional heart care, we have created designated Provider Care Teams.  These Care Teams include your primary Cardiologist (physician) and Advanced Practice Providers (APPs -  Physician Assistants and Nurse Practitioners) who all work together to provide you with the care you need, when you need it. You will need a follow up appointment in 12 months Feb 2021.  Please call our office 2 months in advance to schedule this appointment.  You may see Glenetta Hew, MD or one of the following Advanced Practice Providers on your designated Care Team:   Rosaria Ferries, PA-C . Jory Sims, DNP, ANP  Any Other Special Instructions Will Be Listed Below (If Applicable).

## 2018-08-31 NOTE — Progress Notes (Signed)
PCP: Vivi Barrack, MD EP: Dr. Rayann Heman  Clinic Note: Chief Complaint  Patient presents with  . Follow-up    No complaints  . Hypertension  . Atrial Fibrillation    History of ablation    HPI: Isabella Wright is a 73 y.o. female with a PMH below who presents today for annual f/u of Afib (s/p ablation)/Carotid Artery disease (prior h/o ? PSVT).  I last saw her in April 2019 --doing well.  Positive outlook.  Continuing diltiazem and low-dose metoprolol.  Had restarted HCTZ because of edema and hypertension.  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  --> June 2017 & New Dx Afib -  was very upset b/c her husband had Afib prior to dying.   She noted fatigue & SOB. --> started Xarelto & referred her to the Afib Clinic b/c h/o both tachycardia & bradycardia.  30 d event monitor ordered to determine Afib burden. --> was on metoprolol & diltiazem -> Essentially failed Tikosyn and therefore finally referred for A. fib ablation.  A. fib Ablation (Dr. Rayann Heman) May 13, 2016.-->now no longer on [facility].   Isabella Wright was last seen on 05/09/2018 by Dr. Rayann Heman -- doing well.  NP up a bit --ACE-I dose increased.  Had also had amlodipine added.  Recent Hospitalizations: None  Studies Personally Reviewed - (if available, images/films reviewed: From Epic Chart or Care Everywhere)  None  Interval History: Isabella Wright returns here today doing well.  In great spirits.  Not really noticing much in the way of any irregular heartbeats palpitations.  She walks about 2 and half miles a day almost every day.  If not she goes to the gym.  Sometimes she does both.  She has not had any sensation of chest pain or pressure with rest or exertion.  No resting exertional dyspnea. No PND, orthopnea.  Her edema is pretty well controlled with foot elevation and HCTZ.  Her blood pressures have been high of late, but today is the  best it has been.  She has had her ACE inhibitor dose increased and had amlodipine added and now her blood pressures seem to finally be better. No headache or blurred vision or dizziness associated blood pressure.  No rapid irregular heartbeats palpitations to suggest recurrence of A. Fib. No claudication.  ROS: A comprehensive was performed. Review of Systems  Constitutional: Negative for malaise/fatigue.  HENT: Negative for congestion and nosebleeds.   Respiratory: Negative for shortness of breath.   Gastrointestinal: Negative for blood in stool and melena.  Genitourinary: Negative for hematuria.  Musculoskeletal: Negative for falls and joint pain.  Neurological: Negative for dizziness and focal weakness.  Psychiatric/Behavioral: The patient is nervous/anxious.   All other systems reviewed and are negative.  I have reviewed and (if needed) personally updated the patient's problem list, medications, allergies, past medical and surgical history, social and family history.   Past Medical History:  Diagnosis Date  . Colon polyps 2016  . 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 Diabetes mellitus type 2, controlled ([place]) 2012  . GERD (gastroesophageal reflux disease)   . History of hiatal hernia   . Hypertension 2000   2009: Renal Dopplers showed R RA ostial stenosis -- (told not a problem or 20 yrs)  . Hypertriglyceridemia 1996   Very labile and difficult control: Began in '  96 when she began taking Premarin after hysterectomy -- triglycerides increased to 3582 neuropathy in her feet> care for by endocrinologis; levels vary based on stress.;; Labs from March 2015: TC 150, TG 745, HDL 26, LDL 45  . Kidney stones   . LBBB (left bundle branch block) 2009   Initially diagnosed as rate related during stress echo.  . Macular degeneration    "dry in right; treating like wet in the left" (03/15/2016)  . Paroxysmal SVT (supraventricular tachycardia) ([place]) 2009     less frequent following I&D of chest wall abscess (Jan 015), not previously documented, may have been afib.  . Personal history of atrial fibrillation 01/04/2016   a. s/p ablation in 04/2016  . Sarcoma ([place]) 2015   dermatofibro sarcoma protuberens left groin  . 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 ->  No evidence of acute cerebral infarct.  Brain MRI shows abnormal appearance of distal left vertebral artery flow.  . Vertebral artery occlusion, left    Noted on MRI/A of the neck in April 2018 --left vertebral artery occluded at origin and reconstitutes at level of V3.  This is similar to what was found on carotid Dopplers. -->  ASYMPTOMATIC    Past Surgical History:  Procedure Laterality Date  . Abdominal Doppler ultrasound  February 2009    Right ostial renal artery stenosis; normal renal structure.  . APPENDECTOMY  1968  . CARDIAC ELECTROPHYSIOLOGY [place] AND ABLATION  2017  . CYSTOSCOPY W/ URETEROSCOPY W/ LITHOTRIPSY   1999   Large calcium oxalate stone  . CYSTOSCOPY/RETROGRADE/URETEROSCOPY/STONE EXTRACTION WITH BASKET  1972  . ELECTROPHYSIOLOGIC STUDY N/A 05/13/2016   Procedure: Atrial Fibrillation Ablation;  Surgeon: Thompson Grayer, MD;  Location: [facility];  Service: Cardiovascular;  Laterality: N/A;  . EXCISIONAL HEMORRHOIDECTOMY   1981  . FRACTURE SURGERY    . IRRIGATION AND DEBRIDEMENT SEBACEOUS CYST  January 2015    chest wall; with antibiotics --> following this treatment, SVT episodes became much less frequent.  Marland Kitchen NM MYOVIEW LTD  12/2015   LOW RISK.  No ischemia or infarction.  Small septal-anterior defect likely related to left bundle branch block.  . ORIF PROXIMAL TIBIAL PLATEAU FRACTURE Left 2007   , Related shattered left tibial plateau with reattachment of torn ligaments and insertion of titanium plate and screws.  Marland Kitchen RENAL  ARTERY DUPLEX  01/02/2014   R&L RA - mildly elevated velocities - < 60%; Bilateral Kidneys - normal shape & size.  Marland Kitchen SARCOMA EXCISION Left 2015   dermatofibro sarcoma protuberens, wide excision of groin from front side to buttocks"  . SOFT TISSUE BIOPSY Left 2015  . TOTAL ABDOMINAL HYSTERECTOMY  1994  . TRANSTHORACIC ECHOCARDIOGRAM  12/2015   Normal LV size and function.  EF 55%.  Mild LVH.  Septal-lateral dyssynchrony (from LBBB).  No significant valvular abnormalities.  Marland Kitchen TREADMILL STRESS ECHO  01/2009   No regional wall motion abnormalities with stress == non-ischemic; rate related incomplete left bundle branch block    Current Meds  Medication Sig  . ACCU-CHEK AVIVA PLUS test strip 1 EACH BY OTHER ROUTE 4 (FOUR) TIMES DAILY. USE AS INSTRUCTED DIAG E11.65  . ACCU-CHEK SOFTCLIX LANCETS lancets 1 EACH BY OTHER ROUTE 3 (THREE) TIMES A DAY.  Marland Kitchen acetaminophen (TYLENOL) 325 MG tablet Take 2 tablets (650 mg total) by mouth every 4 (  four) hours as needed for headache or mild pain.  Marland Kitchen amLODipine (NORVASC) 5 MG tablet Take 1 tablet (5 mg total) by mouth daily.  . cholecalciferol (VITAMIN D) 1000 UNITS tablet Take 2,000 Units by mouth daily.   . Continuous Blood Gluc Sensor (FREESTYLE LIBRE SENSOR SYSTEM) MISC 1 EACH BY DOES NOT APPLY ROUTE EVERY 10 DAYS.  Marland Kitchen diltiazem (DILACOR XR) 240 MG 24 hr capsule Take 1 capsule (240 mg total) by mouth daily.  . Dulaglutide (TRULICITY) 2.53 GU/4.4IH SOPN Inject 0.75 mg into the skin once a week. Saturday  . fenofibrate 160 MG tablet Take 160 mg by mouth daily.   . fluticasone (FLONASE) 50 MCG/ACT nasal spray Place 1-2 sprays into both nostrils at bedtime as needed for allergies or rhinitis.  . hydrochlorothiazide (HYDRODIURIL) 25 MG tablet TAKE 1 TABLET BY MOUTH EVERY DAY  . Insulin Aspart (NOVOLOG FLEXPEN [place]) Inject 10 Units into the skin 3 (three) times daily before meals. If blood sugar is higher than 120 give 10 units before each meal  . Insulin Detemir  (LEVEMIR FLEXTOUCH) 100 UNIT/ML Pen Inject 50 Units into the skin at bedtime.   . metFORMIN (GLUCOPHAGE-XR) 500 MG 24 hr tablet Take 1 tablet (500 mg total) by mouth daily with breakfast. 1000 MG TAKE IN THE EVENING  . metoprolol tartrate (LOPRESSOR) 25 MG tablet Take 25 mg tablet in morning only.  . Multiple Vitamins-Minerals (PRESERVISION AREDS 2 PO) Take 1 tablet by mouth 2 (two) times daily.   Marland Kitchen omeprazole (PRILOSEC) 20 MG capsule Take 1 capsule (20 mg total) by mouth daily.  . ramipril (ALTACE) 10 MG capsule Take 2 capsules (20 mg total) by mouth daily.  . rosuvastatin (CRESTOR) 10 MG tablet TAKE 1 TABLET BY MOUTH EVERY DAY  . VASCEPA 1 g CAPS TAKE 2 CAPSULES BY MOUTH 2 (TWO) TIMES DAILY.  Marland Kitchen XARELTO 20 MG TABS tablet TAKE 1 TABLET (20 MG TOTAL) BY MOUTH DAILY WITH SUPPER.  . [DISCONTINUED] metoprolol tartrate (LOPRESSOR) 25 MG tablet Take 0.5 tablets (12.5 mg total) by mouth 2 (two) times daily.  . [DISCONTINUED] omeprazole (PRILOSEC OTC) 20 MG tablet Take 20 mg by mouth daily.    Allergies  Allergen Reactions  . Fluorescein Anaphylaxis    Pt states she had swelling of facial tissue with SOB and itching with a rapid heart rate.  . Demerol [Meperidine] Nausea And Vomiting  . Exenatide Nausea And Vomiting  . Liraglutide Nausea And Vomiting    Social History   Tobacco Use  . Smoking status: Former Smoker    Packs/day: 0.50    Years: 15.00    Pack years: 7.50    Types: Cigarettes  . Smokeless tobacco: Never Used  . Tobacco comment: "smoked in anesthesia school in the '70s; then just a social smoker til I quit"  Substance Use Topics  . Alcohol use: No  . 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 She is now widowed for just under 2 years. Her husband died of cancer. They do not have any children.   She recently (spring of 2015) moved to [place] to be closer to her brother, [name] and his  family.   She been the caregiver for her ailing husband for 5 years. He passed away in 04/03/2012, and she has had prolonged period of grieving partly due to her having lost 3 members of her immediate family during  that time period as well.  --- She is under a lot of stress      She is a former smoker quit years ago. She does not drink alcohol. She is active, but not routine exercise. She says that she tries to eat healthy and knows what usually affects her triglyceride levels.    family history includes Breast cancer in her sister; Heart disease in her brother, father, and mother.  Wt Readings from Last 3 Encounters:  08/31/18 144 lb (65.3 kg)  06/18/18 138 lb (62.6 kg)  05/09/18 143 lb 3.2 oz (65 kg)    PHYSICAL EXAM BP 126/69   Pulse 79   Ht 5' 3.5" (1.613 m)   Wt 144 lb (65.3 kg)   BMI 25.11 kg/m  Physical Exam  Constitutional: She is oriented to person, place, and time. She appears well-developed and well-nourished. No distress.  Healthy-appearing.  Well-groomed  HENT:  Head: Normocephalic and atraumatic.  Neck: Normal range of motion. Neck supple. No JVD present.  Cardiovascular: Normal rate, regular rhythm, normal heart sounds, intact distal pulses and normal pulses.  No extrasystoles are present. PMI is not displaced. Exam reveals no gallop and no friction rub.  No murmur heard. Pulmonary/Chest: Effort normal and breath sounds normal. No respiratory distress. She has no wheezes. She has no rales.  Musculoskeletal: Normal range of motion.  Neurological: She is alert and oriented to person, place, and time.  Psychiatric: She has a normal mood and affect. Her behavior is normal. Judgment and thought content normal.  Vitals reviewed.    Adult ECG Report Not checked  Other studies Reviewed: Additional studies/ records that were reviewed today include:  Recent Labs:   Lab Results  Component Value Date   CREATININE 0.7 04/13/2018   BUN 14 04/13/2018   NA 140 04/13/2018    K 3.9 04/13/2018   CL 103 05/02/2016   CO2 28 05/02/2016   Lab Results  Component Value Date   CHOL 96 04/13/2018   HDL 22 (A) 04/13/2018   LDLCALC NOT CALC 12/30/2013   TRIG 458 (A) 04/13/2018    ASSESSMENT / PLAN: Problem List Items Addressed This Visit    Essential hypertension (Chronic)    Blood pressure now finally stable. She is actually taking her metoprolol 25 mg daily.  I am to have her take that in the morning and then the diltiazem at night.  Amlodipine was recently started along with increased dose of ACE inhibitor.  This is in conjunction with HCTZ.  Finally blood pressures look well-controlled.      Relevant Medications   metoprolol tartrate (LOPRESSOR) 25 MG tablet   LBBB (left bundle branch block) (Chronic)    No untoward symptoms with the bundle branch block.  No evidence of cardiomyopathy symptoms.      Relevant Medications   metoprolol tartrate (LOPRESSOR) 25 MG tablet   Mixed hyperlipidemia: High triglycerides, low HDL (Chronic)    Remains on fenofibrate and converted from Lovaza to Vascepa along with statin.  Had been converted to pravastatin and then put back on Crestor.  I agree with her vascular disease that this is probably necessary.  Her triglycerides are the major issue.  Very fluctuating levels. Question the benefit of increasing Vascepa.      Relevant Medications   metoprolol tartrate (LOPRESSOR) 25 MG tablet   Persistent atrial fibrillation (HCC) - s/p Ablation, miantaining NSR (Chronic)    As far as I can tell, no further breakthrough episodes since ablation.  Remaining  on diltiazem and very low-dose beta-blocker.  Not interested in stopping either 1. On aspirin now but not on DOAC.      Relevant Medications   metoprolol tartrate (LOPRESSOR) 25 MG tablet   Renal artery stenosis (HCC) (Chronic)    No significant stenosis.  We will recheck next year along with carotid Dopplers.      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  Do not need to follow-up this year, can recheck Dopplers next year.      Relevant Medications   metoprolol tartrate (LOPRESSOR) 25 MG tablet    Other Visit Diagnoses    New onset atrial fibrillation (HCC)       Relevant Medications   metoprolol tartrate (LOPRESSOR) 25 MG tablet    --Listing of new onset A. fib is incorrect.  I spent a total of 30 minutes with the patient and chart review. >  50% of the time was spent in direct patient consultation.   Current medicines are reviewed at length with the patient today.  (+/- concerns) n/a The following changes have been made:  n/a  Patient Instructions  Medication Instructions:   Take  Metoprolol tartrate 25 mg  One tablet in the morning only.   Take diltiazem 240 mg  in the afternoon.  If you need a refill on your cardiac medications before your next appointment, please call your pharmacy.   Lab work: Not needed If you have labs (blood work) drawn today and your tests are completely normal, you will receive your results only by: Marland Kitchen MyChart Message (if you have MyChart) OR . A paper copy in the mail If you have any lab test that is abnormal or we need to change your treatment, we will call you to review the results.  Testing/Procedures: Not needed  Follow-Up: At [facility], you and your health needs are our priority.  As part of our continuing mission to provide you with exceptional heart care, we have created designated Provider Care Teams.  These Care Teams include your primary Cardiologist (physician) and Advanced Practice Providers (APPs -  Physician Assistants and Nurse Practitioners) who all work together to provide you with the care you need, when you need it. You will need a follow up appointment in 12 months Feb 2021.  Please call our office 2 months in advance to schedule this appointment.  You may see Glenetta Hew, MD or  one of the following Advanced Practice Providers on your designated Care Team:   Rosaria Ferries, PA-C . Jory Sims, DNP, ANP  Any Other Special Instructions Will Be Listed Below (If Applicable).     Studies Ordered:   No orders of the defined types were placed in this encounter.     Glenetta Hew, M.D., M.S. Interventional Cardiologist   Pager # [phone] Phone # [phone] [address] [place], [place] [address]   Thank you for choosing Heartcare at [facility]!!

## 2018-09-01 ENCOUNTER — Encounter: Payer: Self-pay | Admitting: Cardiology

## 2018-09-01 ENCOUNTER — Other Ambulatory Visit (HOSPITAL_COMMUNITY): Payer: Self-pay | Admitting: Nurse Practitioner

## 2018-09-01 DIAGNOSIS — I4891 Unspecified atrial fibrillation: Secondary | ICD-10-CM

## 2018-09-01 NOTE — Assessment & Plan Note (Signed)
Remains on fenofibrate and converted from Lovaza to Vascepa along with statin.  Had been converted to pravastatin and then put back on Crestor.  I agree with her vascular disease that this is probably necessary.  Her triglycerides are the major issue.  Very fluctuating levels. Question the benefit of increasing Vascepa.

## 2018-09-01 NOTE — Assessment & Plan Note (Signed)
Blood pressure now finally stable. She is actually taking her metoprolol 25 mg daily.  I am to have her take that in the morning and then the diltiazem at night.  Amlodipine was recently started along with increased dose of ACE inhibitor.  This is in conjunction with HCTZ.  Finally blood pressures look well-controlled.

## 2018-09-01 NOTE — Assessment & Plan Note (Addendum)
No untoward symptoms with the bundle branch block.  No evidence of cardiomyopathy symptoms.

## 2018-09-01 NOTE — Assessment & Plan Note (Signed)
As far as I can tell, no further breakthrough episodes since ablation.  Remaining on diltiazem and very low-dose beta-blocker.  Not interested in stopping either 1. On aspirin now but not on DOAC.

## 2018-09-01 NOTE — Assessment & Plan Note (Signed)
No significant stenosis.  We will recheck next year along with carotid Dopplers.

## 2018-09-01 NOTE — Assessment & Plan Note (Signed)
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  Do not need to follow-up this year, can recheck Dopplers next year.

## 2018-09-16 ENCOUNTER — Other Ambulatory Visit: Payer: Self-pay | Admitting: Cardiology

## 2018-10-11 DIAGNOSIS — E1169 Type 2 diabetes mellitus with other specified complication: Secondary | ICD-10-CM | POA: Diagnosis not present

## 2018-10-11 DIAGNOSIS — E785 Hyperlipidemia, unspecified: Secondary | ICD-10-CM | POA: Diagnosis not present

## 2018-10-12 DIAGNOSIS — E782 Mixed hyperlipidemia: Secondary | ICD-10-CM | POA: Diagnosis not present

## 2018-10-12 DIAGNOSIS — E1169 Type 2 diabetes mellitus with other specified complication: Secondary | ICD-10-CM | POA: Diagnosis not present

## 2018-10-12 DIAGNOSIS — E785 Hyperlipidemia, unspecified: Secondary | ICD-10-CM | POA: Diagnosis not present

## 2018-10-16 DIAGNOSIS — H353221 Exudative age-related macular degeneration, left eye, with active choroidal neovascularization: Secondary | ICD-10-CM | POA: Diagnosis not present

## 2018-11-01 ENCOUNTER — Other Ambulatory Visit (HOSPITAL_COMMUNITY): Payer: Self-pay | Admitting: Nurse Practitioner

## 2018-11-01 DIAGNOSIS — I4891 Unspecified atrial fibrillation: Secondary | ICD-10-CM

## 2018-11-07 ENCOUNTER — Other Ambulatory Visit: Payer: Self-pay | Admitting: Family Medicine

## 2018-11-08 ENCOUNTER — Ambulatory Visit (HOSPITAL_COMMUNITY)
Admission: RE | Admit: 2018-11-08 | Discharge: 2018-11-08 | Disposition: A | Discharge status: Home / Self Care | Payer: Medicare Other | Source: Ambulatory Visit | Attending: Physician Assistant | Admitting: Physician Assistant

## 2018-11-08 ENCOUNTER — Encounter (HOSPITAL_COMMUNITY): Payer: Self-pay | Admitting: Nurse Practitioner

## 2018-11-08 ENCOUNTER — Other Ambulatory Visit: Payer: Self-pay

## 2018-11-08 DIAGNOSIS — I480 Paroxysmal atrial fibrillation: Secondary | ICD-10-CM | POA: Diagnosis not present

## 2018-11-08 NOTE — Progress Notes (Signed)
Electrophysiology TeleHealth Note   Due to national recommendations of social distancing due to [place] 19, Audio/video telehealth visit is felt to be most appropriate for this patient at this time.  See MyChart message/consent below from today for patient consent regarding telehealth for the [facility].    Date:  11/08/2018   ID:  Isabella Wright, DOB April 29, 1946, MRN [id number]  Location: home  Provider location: [address] [place], [place] [address] Evaluation Performed: Follow up   PCP:  Isabella Barrack, MD  Primary Cardiologist:  Isabella Wright Electrophysiologist:Isabella Wright   GY:JEHU   History of Present Illness: Isabella Wright is a 73 y.o. female who presents via audio conferencing for a telehealth visit today.   The pt is s/p ablation and reports that she is feeling very well. Sh has not noted any afib. She continues on xarelto, no bleeding issues.She is walking 2 1/2 miles a day. BP' s stable running around 314 systolic, this am 970/26, HR 70 and regular. No LLE, states this has never been an issue for her, on HCTZ for BP management.   Today, she denies symptoms of palpitations, chest pain, shortness of breath, orthopnea, PND, lower extremity edema, claudication, dizziness, presyncope, syncope, bleeding, or neurologic sequela. The patient is tolerating medications without difficulties and is otherwise without complaint today.   she denies symptoms of cough, fevers, chills, or new SOB worrisome for COVID 19.     Atrial Fibrillation Risk Factors:  she does not have symptoms or diagnosis of sleep apnea. she is not compliant with CPAP therapy. she does not have a history of rheumatic fever. she does not have a history of alcohol use. The patient does not have a history of early familial atrial fibrillation or other arrhythmias.  she has a BMI of There is no height or weight on file to calculate BMI.. There were no vitals filed for this visit.  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   Diabetes mellitus type 2, controlled ([place]) 2012   GERD (gastroesophageal reflux disease)    History of hiatal hernia    Hypertension 2000   2009: Renal Dopplers showed R RA ostial stenosis -- (told not a problem or 20 yrs)   Hypertriglyceridemia 1996   Very labile and difficult control: Began in '96 when she began taking Premarin after hysterectomy -- triglycerides increased to 3582 neuropathy in her feet> care for by endocrinologis; levels vary based on stress.;; Labs from March 2015: TC 150, TG 745, HDL 26, LDL 45   Kidney stones    LBBB (left bundle branch block) 2009   Initially diagnosed as rate related during stress echo.   Macular degeneration    "dry in right; treating like wet in the left" (03/15/2016)   Paroxysmal SVT (supraventricular tachycardia) ([place]) 2009   less frequent following I&D of chest wall abscess (Jan 015), not previously documented, may have been afib.   Personal history of atrial fibrillation 01/04/2016   a. 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 ->  No evidence of acute cerebral infarct.  Brain MRI shows abnormal appearance of distal left vertebral artery flow.  Vertebral artery occlusion, left    Noted on MRI/A of the neck in April 2018 --left vertebral artery occluded at origin and reconstitutes at level of V3.  This is similar to what was found on carotid Dopplers. -->  ASYMPTOMATIC   Past Surgical History:  Procedure Laterality Date   Abdominal Doppler ultrasound  February 2009    Right ostial renal artery stenosis; normal renal structure.   APPENDECTOMY  1968   CARDIAC ELECTROPHYSIOLOGY MAPPING  AND ABLATION  2017   CYSTOSCOPY W/ URETEROSCOPY W/ LITHOTRIPSY   1999   Large calcium oxalate stone   CYSTOSCOPY/RETROGRADE/URETEROSCOPY/STONE EXTRACTION WITH BASKET  1972   ELECTROPHYSIOLOGIC STUDY N/A 05/13/2016   Procedure: Atrial Fibrillation Ablation;  Surgeon: Thompson Grayer, MD;  Location: [facility];  Service: Cardiovascular;  Laterality: N/A;   EXCISIONAL HEMORRHOIDECTOMY   1981   FRACTURE SURGERY     IRRIGATION AND DEBRIDEMENT SEBACEOUS CYST  January 2015    chest wall; with antibiotics --> following this treatment, SVT episodes became much less frequent.   NM MYOVIEW LTD  12/2015   LOW RISK.  No ischemia or infarction.  Small septal-anterior defect likely related to left bundle branch block.   ORIF PROXIMAL TIBIAL PLATEAU FRACTURE Left 2007   , Related shattered left tibial plateau with reattachment of torn ligaments and insertion of titanium plate and screws.   RENAL ARTERY DUPLEX  01/02/2014   R&L RA - mildly elevated velocities - < 60%; Bilateral Kidneys - normal shape & size.   SARCOMA EXCISION Left 2015   dermatofibro sarcoma protuberens, wide excision of groin from front side to buttocks"   SOFT TISSUE BIOPSY Left 2015   TOTAL ABDOMINAL HYSTERECTOMY  1994   TRANSTHORACIC ECHOCARDIOGRAM  12/2015   Normal LV size and function.  EF 55%.  Mild LVH.  Septal-lateral dyssynchrony (from LBBB).  No significant valvular abnormalities.   TREADMILL STRESS ECHO  01/2009   No regional wall motion abnormalities with stress == non-ischemic; rate related incomplete left bundle branch block     Current Outpatient Medications  Medication Sig Dispense Refill   ACCU-CHEK AVIVA PLUS test strip 1 EACH BY OTHER ROUTE 4 (FOUR) TIMES DAILY. USE AS INSTRUCTED DIAG E11.65  5   ACCU-CHEK SOFTCLIX LANCETS lancets 1 EACH BY OTHER ROUTE 3 (THREE) TIMES A DAY.  5   acetaminophen (TYLENOL) 325 MG tablet Take 2 tablets (650 mg total) by mouth every 4 (four) hours as needed for  headache or mild pain.     amLODipine (NORVASC) 5 MG tablet Take 1 tablet (5 mg total) by mouth daily. 30 tablet 5   cholecalciferol (VITAMIN D) 1000 UNITS tablet Take 2,000 Units by mouth daily.      Continuous Blood Gluc Sensor (FREESTYLE LIBRE SENSOR SYSTEM) MISC 1 EACH BY DOES NOT APPLY ROUTE EVERY 10 DAYS.  99   diltiazem (DILACOR XR) 240 MG 24 hr capsule Take 1 capsule (240 mg total) by mouth daily. 30 capsule 9   Dulaglutide (TRULICITY) 1.61 WR/6.0AV SOPN Inject 0.75 mg into the skin once a week. Saturday     fenofibrate 160 MG tablet TAKE 1 TABLET BY MOUTH EVERY DAY FOR HIGH TRIGLYCERIDES 90 tablet 0   fluticasone (FLONASE) 50 MCG/ACT nasal spray Place 1-2 sprays into both nostrils at bedtime as needed for allergies or rhinitis.     hydrochlorothiazide (HYDRODIURIL) 25 MG tablet TAKE 1 TABLET BY MOUTH EVERY DAY 90 tablet 1   Insulin Aspart (NOVOLOG FLEXPEN [place]) Inject 10 Units into  the skin 3 (three) times daily before meals. If blood sugar is higher than 120 give 10 units before each meal     Insulin Detemir (LEVEMIR FLEXTOUCH) 100 UNIT/ML Pen Inject 50 Units into the skin at bedtime.      metFORMIN (GLUCOPHAGE-XR) 500 MG 24 hr tablet Take 1 tablet (500 mg total) by mouth daily with breakfast. 1000 MG TAKE IN THE EVENING     metoprolol tartrate (LOPRESSOR) 25 MG tablet Take 1 tablet (25 mg total) by mouth daily. 90 tablet 2   Multiple Vitamins-Minerals (PRESERVISION AREDS 2 PO) Take 1 tablet by mouth 2 (two) times daily.      omeprazole (PRILOSEC) 20 MG capsule Take 1 capsule (20 mg total) by mouth daily. 90 capsule 3   ramipril (ALTACE) 10 MG capsule Take 2 capsules (20 mg total) by mouth daily. 180 capsule 3   rosuvastatin (CRESTOR) 10 MG tablet TAKE 1 TABLET BY MOUTH EVERY DAY 90 tablet 3   VASCEPA 1 g CAPS TAKE 2 CAPSULES BY MOUTH 2 (TWO) TIMES DAILY. 120 capsule 6   XARELTO 20 MG TABS tablet TAKE 1 TABLET (20 MG TOTAL) BY MOUTH DAILY WITH SUPPER. 90 tablet 1   No  current facility-administered medications for this encounter.     Allergies:   Fluorescein; Demerol [meperidine]; Exenatide; and Liraglutide   Social History:  The patient  reports that she has quit smoking. Her smoking use included cigarettes. She has a 7.50 pack-year smoking history. She has never used smokeless tobacco. She reports that she does not drink alcohol or use drugs.   Family History:  The patient's  family history includes Breast cancer in her sister; Heart disease in her brother, father, and mother.    ROS:  Please see the history of present illness.   All other systems are personally reviewed and negative.   Exam: NA telephone visit  Recent Labs: 04/13/2018: BUN 14; Creatinine 0.7; Potassium 3.9; Sodium 140 04/14/2018: ALT 19  personally reviewed    Other studies personally reviewed: Epic records reviewed    ASSESSMENT AND PLAN:  1.  Paroxysmal atrial fibrillation afib very quiet since ablation Continue  diltiazem and metoprolol for now without dose changes Continue  xarelto 20 mg daly   This patients CHA2DS2-VASc Score and unadjusted Ischemic Stroke Rate (% per year) is equal to 4.8 % stroke rate/year from a score of 4  Above score calculated as 1 point each if present [CHF, HTN, DM, Vascular=MI/PAD/Aortic Plaque, Age if 65-74, or Female] Above score calculated as 2 points each if present [Age > 75, or Stroke/TIA/TE]  2.HTN Stable  No change in approach   COVID screen The patient does not have any symptoms that suggest any further testing/ screening at this time.  Social distancing reinforced today.    Follow-up:  With Dr. Rayann Heman in 6 months  Current medicines are reviewed at length with the patient today.   The patient does not have concerns regarding her medicines.  The following changes were made today:  none  Labs/ tests ordered today include: none No orders of the defined types were placed in this encounter.   Patient Risk:  after full review  of this patients clinical status, I feel that they are at moderate risk at this time.   Today, I have spent  30 minutes with the patient with telehealth technology discussing afib/htn/covid    Signed, Roderic Palau NP 11/08/2018 2:03 PM  [facility] [place],  [place] [address] [phone]   I hereby voluntarily request, consent and authorize the [facility] and its employed or contracted physicians, physician assistants, nurse practitioners or other licensed health care professionals (the Practitioner), to provide me with telemedicine health care services (the "Services") as deemed necessary by the treating Practitioner. I acknowledge and consent to receive the Services by the Practitioner via telemedicine. I understand that the telemedicine visit will involve communicating with the Practitioner through live audiovisual communication technology and the disclosure of certain medical information by electronic transmission. I acknowledge that I have been given the opportunity to request an in-person assessment or other available alternative prior to the telemedicine visit and am voluntarily participating in the telemedicine visit.   I understand that I have the right to withhold or withdraw my consent to the use of telemedicine in the course of my care at any time, without affecting my right to future care or treatment, and that the Practitioner or I may terminate the telemedicine visit at any time. I understand that I have the right to inspect all information obtained and/or recorded in the course of the telemedicine visit and may receive copies of available information for a reasonable fee.  I understand that some of the potential risks of receiving the Services via telemedicine include:   Delay or interruption in medical evaluation due to technological equipment failure or disruption;  Information transmitted may not be sufficient (e.g. poor  resolution of images) to allow for appropriate medical decision making by the Practitioner; and/or  In rare instances, security protocols could fail, causing a breach of personal health information.   Furthermore, I acknowledge that it is my responsibility to provide information about my medical history, conditions and care that is complete and accurate to the best of my ability. I acknowledge that Practitioner's advice, recommendations, and/or decision may be based on factors not within their control, such as incomplete or inaccurate data provided by me or distortions of diagnostic images or specimens that may result from electronic transmissions. I understand that the practice of medicine is not an exact science and that Practitioner makes no warranties or guarantees regarding treatment outcomes. I acknowledge that I will receive a copy of this consent concurrently upon execution via email to the email address I last provided but may also request a printed copy by calling the office of the [facility].  I understand that my insurance will be billed for this visit.   I have read or had this consent read to me.  I understand the contents of this consent, which adequately explains the benefits and risks of the Services being provided via telemedicine.  I have been provided ample opportunity to ask questions regarding this consent and the Services and have had my questions answered to my satisfaction.  I give my informed consent for the services to be provided through the use of telemedicine in my medical care  By participating in this telemedicine visit I agree to the above.

## 2018-12-03 ENCOUNTER — Other Ambulatory Visit: Payer: Self-pay | Admitting: Family Medicine

## 2018-12-17 ENCOUNTER — Other Ambulatory Visit: Payer: Self-pay | Admitting: Cardiology

## 2018-12-20 ENCOUNTER — Ambulatory Visit: Payer: Medicare Other | Admitting: Family Medicine

## 2018-12-23 ENCOUNTER — Other Ambulatory Visit: Payer: Self-pay | Admitting: Cardiology

## 2019-01-08 DIAGNOSIS — H353221 Exudative age-related macular degeneration, left eye, with active choroidal neovascularization: Secondary | ICD-10-CM | POA: Diagnosis not present

## 2019-01-14 DIAGNOSIS — E785 Hyperlipidemia, unspecified: Secondary | ICD-10-CM | POA: Diagnosis not present

## 2019-01-14 DIAGNOSIS — C4490 Unspecified malignant neoplasm of skin, unspecified: Secondary | ICD-10-CM | POA: Diagnosis not present

## 2019-01-14 DIAGNOSIS — E1169 Type 2 diabetes mellitus with other specified complication: Secondary | ICD-10-CM | POA: Diagnosis not present

## 2019-01-14 DIAGNOSIS — Z9189 Other specified personal risk factors, not elsewhere classified: Secondary | ICD-10-CM | POA: Diagnosis not present

## 2019-01-14 DIAGNOSIS — C44799 Other specified malignant neoplasm of skin of left lower limb, including hip: Secondary | ICD-10-CM | POA: Diagnosis not present

## 2019-01-17 ENCOUNTER — Ambulatory Visit: Payer: Medicare Other | Admitting: Family Medicine

## 2019-02-01 ENCOUNTER — Other Ambulatory Visit: Payer: Self-pay | Admitting: Family Medicine

## 2019-02-19 DIAGNOSIS — Z803 Family history of malignant neoplasm of breast: Secondary | ICD-10-CM | POA: Diagnosis not present

## 2019-02-19 DIAGNOSIS — Z1231 Encounter for screening mammogram for malignant neoplasm of breast: Secondary | ICD-10-CM | POA: Diagnosis not present

## 2019-02-19 LAB — HM MAMMOGRAPHY

## 2019-02-20 ENCOUNTER — Other Ambulatory Visit (HOSPITAL_COMMUNITY): Payer: Self-pay | Admitting: Nurse Practitioner

## 2019-02-22 ENCOUNTER — Encounter: Payer: Self-pay | Admitting: Family Medicine

## 2019-03-01 DIAGNOSIS — E1100 Type 2 diabetes mellitus with hyperosmolarity without nonketotic hyperglycemic-hyperosmolar coma (NKHHC): Secondary | ICD-10-CM | POA: Diagnosis not present

## 2019-03-01 DIAGNOSIS — Z9189 Other specified personal risk factors, not elsewhere classified: Secondary | ICD-10-CM | POA: Diagnosis not present

## 2019-03-01 DIAGNOSIS — I10 Essential (primary) hypertension: Secondary | ICD-10-CM | POA: Diagnosis not present

## 2019-03-01 DIAGNOSIS — Z79899 Other long term (current) drug therapy: Secondary | ICD-10-CM | POA: Diagnosis not present

## 2019-03-01 DIAGNOSIS — C44799 Other specified malignant neoplasm of skin of left lower limb, including hip: Secondary | ICD-10-CM | POA: Diagnosis not present

## 2019-03-01 DIAGNOSIS — I471 Supraventricular tachycardia: Secondary | ICD-10-CM | POA: Diagnosis not present

## 2019-03-01 DIAGNOSIS — I701 Atherosclerosis of renal artery: Secondary | ICD-10-CM | POA: Diagnosis not present

## 2019-03-12 DIAGNOSIS — L218 Other seborrheic dermatitis: Secondary | ICD-10-CM | POA: Diagnosis not present

## 2019-03-12 DIAGNOSIS — L812 Freckles: Secondary | ICD-10-CM | POA: Diagnosis not present

## 2019-03-12 DIAGNOSIS — D225 Melanocytic nevi of trunk: Secondary | ICD-10-CM | POA: Diagnosis not present

## 2019-03-12 DIAGNOSIS — L281 Prurigo nodularis: Secondary | ICD-10-CM | POA: Diagnosis not present

## 2019-03-12 DIAGNOSIS — D1801 Hemangioma of skin and subcutaneous tissue: Secondary | ICD-10-CM | POA: Diagnosis not present

## 2019-03-12 DIAGNOSIS — L821 Other seborrheic keratosis: Secondary | ICD-10-CM | POA: Diagnosis not present

## 2019-03-13 ENCOUNTER — Other Ambulatory Visit: Payer: Self-pay

## 2019-03-13 MED ORDER — FENOFIBRATE 160 MG PO TABS: ORAL_TABLET | ORAL | 0 refills | Status: DC

## 2019-03-19 DIAGNOSIS — H43813 Vitreous degeneration, bilateral: Secondary | ICD-10-CM | POA: Diagnosis not present

## 2019-03-19 DIAGNOSIS — H2513 Age-related nuclear cataract, bilateral: Secondary | ICD-10-CM | POA: Diagnosis not present

## 2019-03-19 DIAGNOSIS — H353114 Nonexudative age-related macular degeneration, right eye, advanced atrophic with subfoveal involvement: Secondary | ICD-10-CM | POA: Diagnosis not present

## 2019-03-19 DIAGNOSIS — H353221 Exudative age-related macular degeneration, left eye, with active choroidal neovascularization: Secondary | ICD-10-CM | POA: Diagnosis not present

## 2019-04-15 ENCOUNTER — Telehealth (HOSPITAL_COMMUNITY): Payer: Self-pay | Admitting: *Deleted

## 2019-04-15 ENCOUNTER — Other Ambulatory Visit (HOSPITAL_COMMUNITY): Payer: Self-pay | Admitting: *Deleted

## 2019-04-15 MED ORDER — HYDROCHLOROTHIAZIDE 12.5 MG PO TABS: 12.5000 mg | ORAL_TABLET | Freq: Every day | ORAL | 2 refills | Status: DC

## 2019-04-15 NOTE — Telephone Encounter (Signed)
Pt feels she is on too much BP medication - she has self decreased her HCTZ to 12.5mg  a day and feels she is still over medicated. She would like trying to decrease her ramipril to 10mg  a day - she will monitor her BP.and call back if bp not controlled.

## 2019-04-16 DIAGNOSIS — E782 Mixed hyperlipidemia: Secondary | ICD-10-CM | POA: Diagnosis not present

## 2019-04-16 DIAGNOSIS — Z23 Encounter for immunization: Secondary | ICD-10-CM | POA: Diagnosis not present

## 2019-04-16 DIAGNOSIS — E1169 Type 2 diabetes mellitus with other specified complication: Secondary | ICD-10-CM | POA: Diagnosis not present

## 2019-04-16 DIAGNOSIS — E785 Hyperlipidemia, unspecified: Secondary | ICD-10-CM | POA: Diagnosis not present

## 2019-04-22 ENCOUNTER — Ambulatory Visit (INDEPENDENT_AMBULATORY_CARE_PROVIDER_SITE_OTHER): Payer: Medicare Other | Admitting: Family Medicine

## 2019-04-22 ENCOUNTER — Other Ambulatory Visit: Payer: Self-pay

## 2019-04-22 ENCOUNTER — Encounter: Payer: Self-pay | Admitting: Family Medicine

## 2019-04-22 VITALS — BP 128/76 | HR 66 | Temp 98.2°F | Ht 63.5 in | Wt 145.0 lb

## 2019-04-22 DIAGNOSIS — K219 Gastro-esophageal reflux disease without esophagitis: Secondary | ICD-10-CM | POA: Diagnosis not present

## 2019-04-22 DIAGNOSIS — G4700 Insomnia, unspecified: Secondary | ICD-10-CM | POA: Diagnosis not present

## 2019-04-22 DIAGNOSIS — N951 Menopausal and female climacteric states: Secondary | ICD-10-CM

## 2019-04-22 DIAGNOSIS — E782 Mixed hyperlipidemia: Secondary | ICD-10-CM | POA: Diagnosis not present

## 2019-04-22 DIAGNOSIS — M65311 Trigger thumb, right thumb: Secondary | ICD-10-CM

## 2019-04-22 DIAGNOSIS — E118 Type 2 diabetes mellitus with unspecified complications: Secondary | ICD-10-CM

## 2019-04-22 DIAGNOSIS — I10 Essential (primary) hypertension: Secondary | ICD-10-CM

## 2019-04-22 DIAGNOSIS — Z794 Long term (current) use of insulin: Secondary | ICD-10-CM | POA: Diagnosis not present

## 2019-04-22 DIAGNOSIS — I4819 Other persistent atrial fibrillation: Secondary | ICD-10-CM | POA: Diagnosis not present

## 2019-04-22 DIAGNOSIS — L299 Pruritus, unspecified: Secondary | ICD-10-CM

## 2019-04-22 LAB — CBC
HCT: 35.3 % — ABNORMAL LOW (ref 36.0–46.0)
Hemoglobin: 11.3 g/dL — ABNORMAL LOW (ref 12.0–15.0)
MCHC: 32.1 g/dL (ref 30.0–36.0)
MCV: 71.9 fl — ABNORMAL LOW (ref 78.0–100.0)
Platelets: 252 10*3/uL (ref 150.0–400.0)
RBC: 4.91 Mil/uL (ref 3.87–5.11)
RDW: 16.4 % — ABNORMAL HIGH (ref 11.5–15.5)
WBC: 4.9 10*3/uL (ref 4.0–10.5)

## 2019-04-22 NOTE — Assessment & Plan Note (Signed)
Encourage continued sleep hygiene measures.  Discussed using gabapentin however patient declined.

## 2019-04-22 NOTE — Assessment & Plan Note (Signed)
Recommend over-the-counter antihistamine such as Claritin or Zyrtec.

## 2019-04-22 NOTE — Assessment & Plan Note (Signed)
At goal.  Continue Norvasc 5 mg daily, HCTZ 12.5 mg daily, and ramipril 20 mg daily.

## 2019-04-22 NOTE — Progress Notes (Signed)
Please inform patient of the following:  Her hemoglobin has dropped slightly - recommend that she follow up with her oncologist soon. Recommend that she start iron supplementation if she is not doing so already.  Recommend ferrous sulfate 325mg  every other day.   Algis Greenhouse. Jerline Pain, MD 04/22/2019 4:11 PM

## 2019-04-22 NOTE — Assessment & Plan Note (Signed)
Stable.  Continue omeprazole 20mg daily

## 2019-04-22 NOTE — Assessment & Plan Note (Signed)
Continue management per endocrinology.  Last A1c 7.4.

## 2019-04-22 NOTE — Assessment & Plan Note (Signed)
Continue Crestor 10 mg daily, fenofibrate 160 mg daily, and VAscepa 2g twice daily.

## 2019-04-22 NOTE — Patient Instructions (Signed)
It was very nice to see you today!  I will refer you  To see Dr Tamala Julian for your thumb.  Please try clairitin or zyrtec for your itching.  We will check a CBC today.  Come back in 1 year for your next visit, or sooner if needed.   Take care, Dr Jerline Pain  Please try these tips to maintain a healthy lifestyle:   Eat at least 3 REAL meals and 1-2 snacks per day.  Aim for no more than 5 hours between eating.  If you eat breakfast, please do so within one hour of getting up.    Obtain twice as many fruits/vegetables as protein or carbohydrate foods for both lunch and dinner. (Half of each meal should be fruits/vegetables, one quarter protein, and one quarter starchy carbs)   Cut down on sweet beverages. This includes juice, soda, and sweet tea.    Exercise at least 150 minutes every week.

## 2019-04-22 NOTE — Assessment & Plan Note (Signed)
Continue management per cardiology.  Anticoagulated on Xarelto.  Regular rhythm on exam today.  Continue rate control with diltiazem and metoprolol.

## 2019-04-22 NOTE — Progress Notes (Signed)
Chief Complaint:  Isabella Wright is a 73 y.o. female who presents today with a chief complaint of trigger thumb.   Assessment/Plan:  Right trigger thumb Place referral to sports medicine.  Persistent atrial fibrillation (HCC) - s/p Ablation, miantaining NSR Continue management per cardiology.  Anticoagulated on Xarelto.  Regular rhythm on exam today.  Continue rate control with diltiazem and metoprolol.  Essential hypertension At goal.  Continue Norvasc 5 mg daily, HCTZ 12.5 mg daily, and ramipril 20 mg daily.  Mixed hyperlipidemia: High triglycerides, low HDL Continue Crestor 10 mg daily, fenofibrate 160 mg daily, and VAscepa 2g twice daily.   Gastroesophageal reflux disease Stable.  Continue omeprazole 20 mg daily.  Vasomotor symptoms due to menopause Discussed options including trial of Celexa or gabapentin.  She deferred both these options.  Insomnia Encourage continued sleep hygiene measures.  Discussed using gabapentin however patient declined.  Pruritus Recommend over-the-counter antihistamine such as Claritin or Zyrtec.  Diabetes mellitus type 2, controlled ([place]) Continue management per endocrinology.  Last A1c 7.4.     Subjective:  HPI:  Over last few months she has had some difficulty with fully flexing her right thumb.  This started after she was attempting to open a jar that was very difficult to open.  Since then he has occasionally become stuck in place.  She has tried using Voltaren gel with no improvement.  Usually painful area as well.  No other treatments tried.  She is also had some increasing itching, more predominantly around her back and upper neck.  She has tried over-the-counter treatments including Sarna which does not help.  She is also been on hydroxyzine in the past for this which made her too drowsy.  She has had some difficulty staying asleep.  This is usually associated with hot flashes.  This is been going on for several years at this point.   Symptoms seem to be stable.  Her stable, chronic medical conditions are outlined below:  # Essential Hypertension / Atrial Fibrillation - Follows with cardiology - On norvasc 5mg  daily, diltiazem 240mg  daily, HCTZ 12.5mg  daily, metoprolol 25mg  daily, and ramipril 20mg  daily - Anticoagulated on xarelto 20mg  daily  # Type 2 Diabetes - Follows with endocrinology - On trulicity 0.75mg  weekly, levemir 50 units daily, novolog 10units three times daily, and metformin 500mg  daily  # Dyslipidemia - On crestor 10mg  daily, fenofibrate 160mg  daily and Vascepa 2g twice daily  # GERD - On omeprazole 20mg  daily  ROS: Per HPI  PMH: She reports that she has quit smoking. Her smoking use included cigarettes. She has a 7.50 pack-year smoking history. She has never used smokeless tobacco. She reports that she does not drink alcohol or use drugs.      Objective:  Physical Exam: BP 128/76    Pulse 66    Temp 98.2 F (36.8 C)    Ht 5' 3.5" (1.613 m)    Wt 145 lb (65.8 kg)    SpO2 98%    BMI 25.28 kg/m   Gen: NAD, resting comfortably CV: Regular rate and rhythm with no murmurs appreciated Pulm: Normal work of breathing, clear to auscultation bilaterally with no crackles, wheezes, or rhonchi GI: Normal bowel sounds present. Soft, Nontender, Nondistended. MSK: No edema, cyanosis, or clubbing noted.  Right thumb painful to palpation along CMC joint.  Very limited range of motion at interphalangeal joint. Skin: Warm, dry Neuro: Grossly normal, moves all extremities Psych: Normal affect and thought content  Time Spent: I spent >  40 minutes face-to-face with the patient, with more than half spent on counseling for management plan for her trigger thumb, pruritus, insomnia, menopausal symptoms, hypertension, atrial fibrillation, type 2 diabetes, GERD, and dyslipidemia.      Algis Greenhouse. Jerline Pain, MD 04/22/2019 10:50 AM

## 2019-04-22 NOTE — Assessment & Plan Note (Signed)
Discussed options including trial of Celexa or gabapentin.  She deferred both these options.

## 2019-04-24 ENCOUNTER — Encounter: Payer: Self-pay | Admitting: Family Medicine

## 2019-04-24 ENCOUNTER — Ambulatory Visit (INDEPENDENT_AMBULATORY_CARE_PROVIDER_SITE_OTHER): Payer: Medicare Other | Admitting: Family Medicine

## 2019-04-24 ENCOUNTER — Ambulatory Visit: Payer: Medicare Other

## 2019-04-24 DIAGNOSIS — M65311 Trigger thumb, right thumb: Secondary | ICD-10-CM

## 2019-04-24 NOTE — Progress Notes (Signed)
Office Visit Note   Patient: Isabella Wright           Date of Birth: [birth year]           MRN: [id number] Visit Date: 04/24/2019 Requested by: Vivi Barrack, MD [address] [place],  [place] [address] PCP: Vivi Barrack, MD  Subjective: Chief Complaint  Patient presents with  . Right Thumb - Pain    Pain in thumb since June of this year. Pain in [facility] joint. Trigger thumb.    HPI: Right thumb pain.  She is right-hand dominant.  In June she was attempting to loosen a water faucet.  Her thumb was sore and the next day it began triggering.  It has not improved since then.  Very painful when she tries to flex it.               ROS: No fevers or chills.  All other systems were reviewed and are negative.  Objective: Vital Signs: There were no vitals taken for this visit.  Physical Exam:  General:  Alert and oriented, in no acute distress. Pulm:  Breathing unlabored. Psy:  Normal mood, congruent affect. Skin: No rash or erythema. Right thumb: Tender nodule at the A1 pulley and triggering in flexion.  Full passive  Imaging: None   Assessment & Plan: 1.  Right trigger thumb - Discussed options and she wants to try injection.  OT if no improvement.     Procedures: Rght thumb injection:  After sterile prep with alcohol, injected 2 cc 1% lido without epi, then 20 mg methylprednisolone into A1 Pulley.    PMFS History: Patient Active Problem List   Diagnosis Date Noted  . Pruritus 04/22/2019  . Insomnia 04/22/2019  . Vasomotor symptoms due to menopause 04/22/2019  . Gastroesophageal reflux disease 04/22/2019  . Hyperlipemia, mixed 03/06/2017  . Vertebral artery obstruction, left 10/29/2016  . Persistent atrial fibrillation ([place]) - s/p Ablation, miantaining NSR 03/15/2016  . Essential hypertension 03/19/2014  . Encounter for long-term (current) use of other medications 12/03/2013  . Renal artery stenosis ([place]) 12/03/2013  . Mixed hyperlipidemia: High triglycerides, low HDL  12/03/2013  . LBBB (left bundle branch block) 12/03/2013  . SVT (supraventricular tachycardia) ([place]) 12/03/2013  . Diabetes mellitus type 2, controlled ([place]) 07/18/2010   Past Medical History:  Diagnosis Date  . Colon polyps 2016  . 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 Diabetes mellitus type 2, controlled ([place]) 2012  . GERD (gastroesophageal reflux disease)   . History of hiatal hernia   . Hypertension 2000   2009: Renal Dopplers showed R RA ostial stenosis -- (told not a problem or 20 yrs)  . Hypertriglyceridemia 1996   Very labile and difficult control: Began in '96 when she began taking Premarin after hysterectomy -- triglycerides increased to 3582 neuropathy in her feet> care for by endocrinologis; levels vary based on stress.;; Labs from March 2015: TC 150, TG 745, HDL 26, LDL 45  . Kidney stones   . LBBB (left bundle branch block) 2009   Initially diagnosed as rate related during stress echo.  . Macular degeneration    "dry in right; treating like wet in the left" (03/15/2016)  . Paroxysmal SVT (supraventricular tachycardia) ([place]) 2009   less frequent following I&D of chest wall abscess (Jan 015), not previously documented, may have been afib.  . Personal history of atrial fibrillation 01/04/2016   a. s/p ablation in 04/2016  . Sarcoma ([place])  2015   dermatofibro sarcoma protuberens left groin  . 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 ->  No evidence of acute cerebral infarct.  Brain MRI shows abnormal appearance of distal left vertebral artery flow.  . Vertebral artery occlusion, left    Noted on MRI/A of the neck in April 2018 --left vertebral artery occluded at origin and reconstitutes at level of V3.  This is similar to what was found on carotid Dopplers. -->  ASYMPTOMATIC    Family History  Problem Relation  Age of Onset  . Heart disease Brother        Died at 47  . Heart disease Mother        Died at 69  . Heart disease Father        Died at 4  . Breast cancer Sister        Died at 49    Past Surgical History:  Procedure Laterality Date  . Abdominal Doppler ultrasound  February 2009    Right ostial renal artery stenosis; normal renal structure.  . APPENDECTOMY  1968  . CARDIAC ELECTROPHYSIOLOGY [place] AND ABLATION  2017  . CYSTOSCOPY W/ URETEROSCOPY W/ LITHOTRIPSY   1999   Large calcium oxalate stone  . CYSTOSCOPY/RETROGRADE/URETEROSCOPY/STONE EXTRACTION WITH BASKET  1972  . ELECTROPHYSIOLOGIC STUDY N/A 05/13/2016   Procedure: Atrial Fibrillation Ablation;  Surgeon: Thompson Grayer, MD;  Location: [place] [facility];  Service: Cardiovascular;  Laterality: N/A;  . EXCISIONAL HEMORRHOIDECTOMY   1981  . FRACTURE SURGERY    . IRRIGATION AND DEBRIDEMENT SEBACEOUS CYST  January 2015    chest wall; with antibiotics --> following this treatment, SVT episodes became much less frequent.  Marland Kitchen NM MYOVIEW LTD  12/2015   LOW RISK.  No ischemia or infarction.  Small septal-anterior defect likely related to left bundle branch block.  . ORIF PROXIMAL TIBIAL PLATEAU FRACTURE Left 2007   , Related shattered left tibial plateau with reattachment of torn ligaments and insertion of titanium plate and screws.  Marland Kitchen RENAL ARTERY DUPLEX  01/02/2014   R&L RA - mildly elevated velocities - < 60%; Bilateral Kidneys - normal shape & size.  Marland Kitchen SARCOMA EXCISION Left 2015   dermatofibro sarcoma protuberens, wide excision of groin from front side to buttocks"  . SOFT TISSUE BIOPSY Left 2015  . TOTAL ABDOMINAL HYSTERECTOMY  1994  . TRANSTHORACIC ECHOCARDIOGRAM  12/2015   Normal LV size and function.  EF 55%.  Mild LVH.  Septal-lateral dyssynchrony (from LBBB).  No significant valvular abnormalities.  Marland Kitchen TREADMILL STRESS ECHO  01/2009   No regional wall motion abnormalities with stress == non-ischemic; rate related incomplete  left bundle branch block   Social History   Occupational History  . Not on file  Tobacco Use  . Smoking status: Former Smoker    Packs/day: 0.50    Years: 15.00    Pack years: 7.50    Types: Cigarettes  . Smokeless tobacco: Never Used  . 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 Alcohol use: No  . Drug use: No  . Sexual activity: Not Currently

## 2019-04-26 DIAGNOSIS — E1100 Type 2 diabetes mellitus with hyperosmolarity without nonketotic hyperglycemic-hyperosmolar coma (NKHHC): Secondary | ICD-10-CM | POA: Diagnosis not present

## 2019-04-26 DIAGNOSIS — C44799 Other specified malignant neoplasm of skin of left lower limb, including hip: Secondary | ICD-10-CM | POA: Diagnosis not present

## 2019-04-26 DIAGNOSIS — D509 Iron deficiency anemia, unspecified: Secondary | ICD-10-CM | POA: Diagnosis not present

## 2019-04-26 DIAGNOSIS — Z9189 Other specified personal risk factors, not elsewhere classified: Secondary | ICD-10-CM | POA: Diagnosis not present

## 2019-04-26 DIAGNOSIS — I10 Essential (primary) hypertension: Secondary | ICD-10-CM | POA: Diagnosis not present

## 2019-04-26 HISTORY — DX: Iron deficiency anemia, unspecified: D50.9

## 2019-04-26 LAB — CBC AND DIFFERENTIAL
HCT: 36 (ref 36–46)
Hemoglobin: 11.6 — AB (ref 12.0–16.0)
Platelets: 238 (ref 150–399)
WBC: 4.9

## 2019-04-30 ENCOUNTER — Telehealth: Payer: Self-pay | Admitting: *Deleted

## 2019-04-30 DIAGNOSIS — D509 Iron deficiency anemia, unspecified: Secondary | ICD-10-CM | POA: Diagnosis not present

## 2019-04-30 DIAGNOSIS — K219 Gastro-esophageal reflux disease without esophagitis: Secondary | ICD-10-CM | POA: Diagnosis not present

## 2019-04-30 DIAGNOSIS — Z8679 Personal history of other diseases of the circulatory system: Secondary | ICD-10-CM | POA: Diagnosis not present

## 2019-04-30 DIAGNOSIS — Z86010 Personal history of colonic polyps: Secondary | ICD-10-CM | POA: Diagnosis not present

## 2019-04-30 HISTORY — DX: Iron deficiency anemia, unspecified: D50.9

## 2019-04-30 NOTE — Telephone Encounter (Signed)
Patient with diagnosis of afib on Xarelto for anticoagulation.    Procedure: COLONOSCOPY Date of procedure: 05/15/2019  CHADS2-VASc score of  4 ( HTN, AGE, DM2, female)  CrCl 66 ml/min  Per office protocol, patient can hold Xarelto for 2 days prior to procedure.

## 2019-04-30 NOTE — Telephone Encounter (Signed)
   [facility] Pre-operative Risk Assessment    Request for surgical clearance:  1. What type of surgery is being performed? COLONOSCOPY   2. When is this surgery scheduled? 05/15/19   3. What type of clearance is required (medical clearance vs. Pharmacy clearance to hold med vs. Both)? BOTH  4. Are there any medications that need to be held prior to surgery and how long? XARELTO 2 DAYS PRIOR TO PROCEDURE    5. Practice name and name of physician performing surgery?  [facility];  DR. Levonne Spiller AKDAMAR  6. What is your office phone number [phone]    7.   What is your office fax number [phone]  8.   Anesthesia type (None, local, MAC, general) ? NOT LISTED; PROPOFOL ?    Isabella Wright 04/30/2019, 3:34 PM  _________________________________________________________________   (provider comments below)

## 2019-05-01 ENCOUNTER — Encounter: Payer: Self-pay | Admitting: Family Medicine

## 2019-05-01 ENCOUNTER — Encounter: Payer: Self-pay | Admitting: Physical Therapy

## 2019-05-01 NOTE — Telephone Encounter (Signed)
   Primary Cardiologist: Glenetta Hew, MD  Chart reviewed as part of pre-operative protocol coverage. Patient was contacted 05/01/2019 in reference to pre-operative risk assessment for pending surgery as outlined below.  Isabella Wright was last seen on 11/08/2018 by atrial fibrillation clinic.  Since that day, Isabella Wright has done well without chest pain or palpitation.  Therefore, based on ACC/AHA guidelines, the patient would be at acceptable risk for the planned procedure without further cardiovascular testing.   I will route this recommendation to the requesting party via Epic fax function and remove from pre-op pool.  Please call with questions. Per our clinical pharmacist, ok to hold Xarelto for 2 days prior to the procedure and restart as soon as possible afterward.   [place], [place] 05/01/2019, 11:34 AM

## 2019-05-07 ENCOUNTER — Other Ambulatory Visit: Payer: Self-pay | Admitting: Cardiology

## 2019-05-07 ENCOUNTER — Telehealth: Payer: Self-pay | Admitting: Family Medicine

## 2019-05-07 NOTE — Telephone Encounter (Signed)
See note  Copied from [place] [id number]. Topic: General - Other >> May 07, 2019  9:33 AM Yvette Rack wrote: Reason for CRM: Pt stated she has been summoned for jury duty and she would like to ask Dr. Jerline Pain for a letter to excuse her from jury duty. Pt stated she is 73 years old and has health conditions which are the reasons she would like the letter. Pt also said that she needs the letter completed as soon as possible because she has to turn it in 10 days prior to court date. Pt requests call back once letter is completed and ready for pick up

## 2019-05-07 NOTE — Telephone Encounter (Signed)
Patient notified letter is ready for pick up. 

## 2019-05-15 DIAGNOSIS — K523 Indeterminate colitis: Secondary | ICD-10-CM | POA: Diagnosis not present

## 2019-05-15 DIAGNOSIS — K219 Gastro-esophageal reflux disease without esophagitis: Secondary | ICD-10-CM | POA: Diagnosis not present

## 2019-05-15 DIAGNOSIS — D509 Iron deficiency anemia, unspecified: Secondary | ICD-10-CM | POA: Diagnosis not present

## 2019-05-15 DIAGNOSIS — D500 Iron deficiency anemia secondary to blood loss (chronic): Secondary | ICD-10-CM | POA: Diagnosis not present

## 2019-05-15 DIAGNOSIS — K5730 Diverticulosis of large intestine without perforation or abscess without bleeding: Secondary | ICD-10-CM | POA: Diagnosis not present

## 2019-05-15 DIAGNOSIS — D122 Benign neoplasm of ascending colon: Secondary | ICD-10-CM | POA: Diagnosis not present

## 2019-05-15 DIAGNOSIS — K317 Polyp of stomach and duodenum: Secondary | ICD-10-CM | POA: Diagnosis not present

## 2019-05-15 DIAGNOSIS — K3189 Other diseases of stomach and duodenum: Secondary | ICD-10-CM | POA: Diagnosis not present

## 2019-05-15 DIAGNOSIS — K635 Polyp of colon: Secondary | ICD-10-CM | POA: Diagnosis not present

## 2019-05-27 ENCOUNTER — Telehealth: Payer: Medicare Other | Admitting: Internal Medicine

## 2019-05-28 ENCOUNTER — Other Ambulatory Visit: Payer: Self-pay | Admitting: Internal Medicine

## 2019-05-28 ENCOUNTER — Encounter: Payer: Self-pay | Admitting: Physical Therapy

## 2019-05-28 DIAGNOSIS — H2513 Age-related nuclear cataract, bilateral: Secondary | ICD-10-CM | POA: Diagnosis not present

## 2019-05-28 DIAGNOSIS — H43813 Vitreous degeneration, bilateral: Secondary | ICD-10-CM | POA: Diagnosis not present

## 2019-05-28 DIAGNOSIS — H353114 Nonexudative age-related macular degeneration, right eye, advanced atrophic with subfoveal involvement: Secondary | ICD-10-CM | POA: Diagnosis not present

## 2019-05-28 DIAGNOSIS — H353221 Exudative age-related macular degeneration, left eye, with active choroidal neovascularization: Secondary | ICD-10-CM | POA: Diagnosis not present

## 2019-06-03 ENCOUNTER — Encounter: Payer: Self-pay | Admitting: Internal Medicine

## 2019-06-03 ENCOUNTER — Telehealth (INDEPENDENT_AMBULATORY_CARE_PROVIDER_SITE_OTHER): Payer: Medicare Other | Admitting: Internal Medicine

## 2019-06-03 ENCOUNTER — Other Ambulatory Visit: Payer: Self-pay

## 2019-06-03 VITALS — BP 162/93 | HR 71 | Ht 63.5 in | Wt 143.0 lb

## 2019-06-03 DIAGNOSIS — I10 Essential (primary) hypertension: Secondary | ICD-10-CM | POA: Diagnosis not present

## 2019-06-03 DIAGNOSIS — I701 Atherosclerosis of renal artery: Secondary | ICD-10-CM | POA: Diagnosis not present

## 2019-06-03 DIAGNOSIS — I480 Paroxysmal atrial fibrillation: Secondary | ICD-10-CM | POA: Diagnosis not present

## 2019-06-03 NOTE — Progress Notes (Signed)
Electrophysiology TeleHealth Note  Due to national recommendations of social distancing due to [place] 19, an audio telehealth visit is felt to be most appropriate for this patient at this time.  Verbal consent was obtained by me for the telehealth visit today.  The patient does not have capability for a virtual visit.  A phone visit is therefore required today.   Date:  06/03/2019   ID:  Isabella Wright, DOB Nov 03, 1945, MRN [id number]  Location: patient's home  Provider location:  [facility]  Evaluation Performed: Follow-up visit  PCP:  Vivi Barrack, MD   Electrophysiologist:  Dr Rayann Heman  Chief Complaint:  palpitations  History of Present Illness:    Isabella Wright is a 73 y.o. female who presents via telehealth conferencing today.  Since last being seen in our clinic, the patient reports doing very well.  No afib.  She has had some anemia, which is improving.  Today, she denies symptoms of palpitations, chest pain, shortness of breath,  lower extremity edema, dizziness, presyncope, or syncope.  The patient is otherwise without complaint today.  The patient denies symptoms of fevers, chills, cough, or new SOB worrisome for COVID 19.  Past Medical History:  Diagnosis Date  . Colon polyps 2016  . 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 Diabetes mellitus type 2, controlled ([place]) 2012  . GERD (gastroesophageal reflux disease)   . History of hiatal hernia   . Hypertension 2000   2009: Renal Dopplers showed R RA ostial stenosis -- (told not a problem or 20 yrs)  . Hypertriglyceridemia 1996   Very labile and difficult control: Began in '96 when she began taking Premarin after hysterectomy -- triglycerides increased to 3582 neuropathy in her feet> care for by endocrinologis; levels vary based on stress.;; Labs from March 2015: TC 150, TG 745, HDL 26, LDL 45  . Iron deficiency anemia 04/30/2019  . Kidney stones   . LBBB (left bundle branch block) 2009    Initially diagnosed as rate related during stress echo.  . Macular degeneration    "dry in right; treating like wet in the left" (03/15/2016)  . Microcytic anemia 04/26/2019  . Paroxysmal SVT (supraventricular tachycardia) ([place]) 2009   less frequent following I&D of chest wall abscess (Jan 015), not previously documented, may have been afib.  . Personal history of atrial fibrillation 01/04/2016   a. s/p ablation in 04/2016  . Sarcoma ([place]) 2015   dermatofibro sarcoma protuberens left groin  . 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 ->  No evidence of acute cerebral infarct.  Brain MRI shows abnormal appearance of distal left vertebral artery flow.  . Vertebral artery occlusion, left    Noted on MRI/A of the neck in April 2018 --left vertebral artery occluded at origin and reconstitutes at level of V3.  This is similar to what was found on carotid Dopplers. -->  ASYMPTOMATIC    Past Surgical History:  Procedure Laterality Date  . Abdominal Doppler ultrasound  February 2009    Right ostial renal artery stenosis; normal renal structure.  . APPENDECTOMY  1968  . CARDIAC ELECTROPHYSIOLOGY [place] AND ABLATION  2017  . CYSTOSCOPY W/ URETEROSCOPY W/ LITHOTRIPSY   1999   Large calcium oxalate stone  . CYSTOSCOPY/RETROGRADE/URETEROSCOPY/STONE EXTRACTION WITH BASKET  1972  . ELECTROPHYSIOLOGIC STUDY  N/A 05/13/2016   Procedure: Atrial Fibrillation Ablation;  Surgeon: Thompson Grayer, MD;  Location: [place] [facility];  Service: Cardiovascular;  Laterality: N/A;  . EXCISIONAL HEMORRHOIDECTOMY   1981  . FRACTURE SURGERY    . IRRIGATION AND DEBRIDEMENT SEBACEOUS CYST  January 2015    chest wall; with antibiotics --> following this treatment, SVT episodes became much less frequent.  Marland Kitchen NM MYOVIEW LTD  12/2015   LOW RISK.  No ischemia or infarction.  Small septal-anterior  defect likely related to left bundle branch block.  . ORIF PROXIMAL TIBIAL PLATEAU FRACTURE Left 2007   , Related shattered left tibial plateau with reattachment of torn ligaments and insertion of titanium plate and screws.  Marland Kitchen RENAL ARTERY DUPLEX  01/02/2014   R&L RA - mildly elevated velocities - < 60%; Bilateral Kidneys - normal shape & size.  Marland Kitchen SARCOMA EXCISION Left 2015   dermatofibro sarcoma protuberens, wide excision of groin from front side to buttocks"  . SOFT TISSUE BIOPSY Left 2015  . TOTAL ABDOMINAL HYSTERECTOMY  1994  . TRANSTHORACIC ECHOCARDIOGRAM  12/2015   Normal LV size and function.  EF 55%.  Mild LVH.  Septal-lateral dyssynchrony (from LBBB).  No significant valvular abnormalities.  Marland Kitchen TREADMILL STRESS ECHO  01/2009   No regional wall motion abnormalities with stress == non-ischemic; rate related incomplete left bundle branch block    Current Outpatient Medications  Medication Sig Dispense Refill  . ACCU-CHEK AVIVA PLUS test strip 1 EACH BY OTHER ROUTE 4 (FOUR) TIMES DAILY. USE AS INSTRUCTED DIAG E11.65  5  . ACCU-CHEK SOFTCLIX LANCETS lancets 1 EACH BY OTHER ROUTE 3 (THREE) TIMES A DAY.  5  . acetaminophen (TYLENOL) 325 MG tablet Take 2 tablets (650 mg total) by mouth every 4 (four) hours as needed for headache or mild pain.    Marland Kitchen amLODipine (NORVASC) 5 MG tablet TAKE 1 TABLET BY MOUTH EVERY DAY 30 tablet 5  . cholecalciferol (VITAMIN D) 1000 UNITS tablet Take 2,000 Units by mouth daily.     . Continuous Blood Gluc Sensor (FREESTYLE LIBRE SENSOR SYSTEM) MISC 1 EACH BY DOES NOT APPLY ROUTE EVERY 10 DAYS.  99  . DILT-XR 240 MG 24 hr capsule TAKE 1 CAPSULE BY MOUTH EVERY DAY 90 capsule 3  . Dulaglutide (TRULICITY) A999333 0000000 SOPN Inject 0.75 mg into the skin once a week. Saturday    . fenofibrate 160 MG tablet TAKE 1 TABLET BY MOUTH EVERY DAY FOR HIGH TRIGLYCERIDES 90 tablet 0  . fluticasone (FLONASE) 50 MCG/ACT nasal spray Place 1-2 sprays into both nostrils at bedtime as  needed for allergies or rhinitis.    . hydrochlorothiazide (HYDRODIURIL) 12.5 MG tablet Take 1 tablet (12.5 mg total) by mouth daily. 90 tablet 2  . Insulin Aspart (NOVOLOG FLEXPEN [place]) Inject 10 Units into the skin 3 (three) times daily before meals. If blood sugar is higher than 120 give 10 units before each meal    . Insulin Detemir (LEVEMIR FLEXTOUCH) 100 UNIT/ML Pen Inject 50 Units into the skin at bedtime.     . metFORMIN (GLUCOPHAGE-XR) 500 MG 24 hr tablet Take 1 tablet (500 mg total) by mouth daily with breakfast. 1000 MG TAKE IN THE EVENING    . metoprolol tartrate (LOPRESSOR) 25 MG tablet Take 1 tablet (25 mg total) by mouth daily. 90 tablet 2  . Multiple Vitamins-Minerals (PRESERVISION AREDS 2 PO) Take 1 tablet by mouth 2 (two) times daily.     Marland Kitchen omeprazole (PRILOSEC) 20 MG  capsule Take 1 capsule (20 mg total) by mouth daily. 90 capsule 3  . ramipril (ALTACE) 10 MG capsule TAKE 2 CAPSULES BY MOUTH DAILY 180 capsule 0  . rosuvastatin (CRESTOR) 10 MG tablet TAKE 1 TABLET BY MOUTH EVERY DAY 90 tablet 3  . VASCEPA 1 g CAPS TAKE 2 CAPSULES BY MOUTH 2 (TWO) TIMES DAILY. 360 capsule 1  . XARELTO 20 MG TABS tablet TAKE 1 TABLET (20 MG TOTAL) BY MOUTH DAILY WITH SUPPER. 90 tablet 1   No current facility-administered medications for this visit.     Allergies:   Fluorescein, Demerol [meperidine], Exenatide, and Liraglutide   Social History:  The patient  reports that she has quit smoking. Her smoking use included cigarettes. She has a 7.50 pack-year smoking history. She has never used smokeless tobacco. She reports that she does not drink alcohol or use drugs.   Family History:  The patient's family history includes Breast cancer in her sister; Heart disease in her brother, father, and mother.   ROS:  Please see the history of present illness.   All other systems are personally reviewed and negative.    Exam:    Vital Signs:  BP (!) 162/93   Pulse 71   Ht 5' 3.5" (1.613 m)   Wt 143 lb  (64.9 kg)   BMI 24.93 kg/m   Well sounding, alert and conversant   Labs/Other Tests and Data Reviewed:    Recent Labs: 04/26/2019: Hemoglobin 11.6; Platelets 238   Wt Readings from Last 3 Encounters:  06/03/19 143 lb (64.9 kg)  04/22/19 145 lb (65.8 kg)  08/31/18 144 lb (65.3 kg)     ASSESSMENT & PLAN:    1.  Persistent atrial fibrillation Doing very well post ablation off AAD therapy chads2vasc score is 4.  She is on xarelto  2. HTN Elevated in the morning but improves after taking her medicines No changes  3. CAD No ischemic symptoms No changes  Follow-up:     Patient Risk:  after full review of this patients clinical status, I feel that they are at moderate risk at this time.  Today, I have spent 15 minutes with the patient with telehealth technology discussing arrhythmia management .    Army Fossa, MD  06/03/2019 10:01 AM     [facility] [address] [place] [address] [phone] (office) [phone] (fax)

## 2019-06-06 ENCOUNTER — Other Ambulatory Visit: Payer: Self-pay | Admitting: Cardiology

## 2019-06-18 ENCOUNTER — Other Ambulatory Visit: Payer: Self-pay | Admitting: Cardiology

## 2019-06-19 ENCOUNTER — Telehealth: Payer: Medicare Other | Admitting: Internal Medicine

## 2019-07-10 ENCOUNTER — Other Ambulatory Visit: Payer: Self-pay

## 2019-07-10 MED ORDER — AMLODIPINE BESYLATE 5 MG PO TABS: 5.0000 mg | ORAL_TABLET | Freq: Every day | ORAL | 5 refills | Status: DC

## 2019-07-15 ENCOUNTER — Other Ambulatory Visit: Payer: Self-pay | Admitting: Family Medicine

## 2019-07-15 MED ORDER — OMEPRAZOLE 20 MG PO CPDR: 20.0000 mg | DELAYED_RELEASE_CAPSULE | Freq: Every day | ORAL | 3 refills | Status: DC

## 2019-07-15 NOTE — Telephone Encounter (Signed)
Pharm is calling and pt needs a refill on omeprazole 20 mg

## 2019-07-16 DIAGNOSIS — E1169 Type 2 diabetes mellitus with other specified complication: Secondary | ICD-10-CM | POA: Diagnosis not present

## 2019-07-16 DIAGNOSIS — D509 Iron deficiency anemia, unspecified: Secondary | ICD-10-CM | POA: Diagnosis not present

## 2019-07-16 DIAGNOSIS — E782 Mixed hyperlipidemia: Secondary | ICD-10-CM | POA: Diagnosis not present

## 2019-07-16 DIAGNOSIS — E785 Hyperlipidemia, unspecified: Secondary | ICD-10-CM | POA: Diagnosis not present

## 2019-08-06 DIAGNOSIS — H2513 Age-related nuclear cataract, bilateral: Secondary | ICD-10-CM | POA: Diagnosis not present

## 2019-08-06 DIAGNOSIS — H43813 Vitreous degeneration, bilateral: Secondary | ICD-10-CM | POA: Diagnosis not present

## 2019-08-06 DIAGNOSIS — H353114 Nonexudative age-related macular degeneration, right eye, advanced atrophic with subfoveal involvement: Secondary | ICD-10-CM | POA: Diagnosis not present

## 2019-08-06 DIAGNOSIS — H353221 Exudative age-related macular degeneration, left eye, with active choroidal neovascularization: Secondary | ICD-10-CM | POA: Diagnosis not present

## 2019-08-08 ENCOUNTER — Ambulatory Visit: Payer: Medicare Other | Attending: Internal Medicine

## 2019-08-08 DIAGNOSIS — Z23 Encounter for immunization: Secondary | ICD-10-CM

## 2019-08-08 NOTE — Progress Notes (Signed)
   Covid-19 Vaccination Clinic  Name:  Isabella Wright    MRN: [id number] DOB: September 16, 1945  08/08/2019  [name] was observed post Covid-19 immunization for 15 minutes without incidence. She was provided with Vaccine Information Sheet and instruction to access the V-Safe system.   [name] was instructed to call 911 with any severe reactions post vaccine: Marland Kitchen Difficulty breathing  . Swelling of your face and throat  . A fast heartbeat  . A bad rash all over your body  . Dizziness and weakness    Immunizations Administered    Name Date Dose VIS Date Route   Pfizer COVID-19 Vaccine 08/08/2019  1:03 PM 0.3 mL 06/28/2019 Intramuscular   Manufacturer: [place]   Lot: [id number]   [place]: [id number]

## 2019-08-15 ENCOUNTER — Other Ambulatory Visit: Payer: Self-pay | Admitting: Family Medicine

## 2019-08-15 NOTE — Telephone Encounter (Signed)
Last OV 04/22/19 Last refill 03/13/19 #90/0 Next OV not scheduled

## 2019-08-29 ENCOUNTER — Ambulatory Visit: Payer: Medicare Other | Attending: Internal Medicine

## 2019-08-29 DIAGNOSIS — Z23 Encounter for immunization: Secondary | ICD-10-CM | POA: Insufficient documentation

## 2019-08-29 NOTE — Progress Notes (Signed)
   Covid-19 Vaccination Clinic  Name:  Isabella Wright    MRN: [id number] DOB: 15-Jul-1946  08/29/2019  [name] was observed post Covid-19 immunization for 15 minutes without incidence. She was provided with Vaccine Information Sheet and instruction to access the V-Safe system.   [name] was instructed to call 911 with any severe reactions post vaccine: Marland Kitchen Difficulty breathing  . Swelling of your face and throat  . A fast heartbeat  . A bad rash all over your body  . Dizziness and weakness    Immunizations Administered    Name Date Dose VIS Date Route   Pfizer COVID-19 Vaccine 08/29/2019 12:50 PM 0.3 mL 06/28/2019 Intramuscular   Manufacturer: [place]   Lot: [id number]   [place]: [id number]

## 2019-09-01 ENCOUNTER — Other Ambulatory Visit: Payer: Self-pay | Admitting: Internal Medicine

## 2019-09-03 ENCOUNTER — Ambulatory Visit (INDEPENDENT_AMBULATORY_CARE_PROVIDER_SITE_OTHER): Payer: Medicare Other | Admitting: Cardiology

## 2019-09-03 ENCOUNTER — Other Ambulatory Visit: Payer: Self-pay | Admitting: Cardiology

## 2019-09-03 ENCOUNTER — Other Ambulatory Visit: Payer: Self-pay

## 2019-09-03 ENCOUNTER — Encounter: Payer: Self-pay | Admitting: Cardiology

## 2019-09-03 ENCOUNTER — Other Ambulatory Visit (HOSPITAL_COMMUNITY): Payer: Self-pay | Admitting: Nurse Practitioner

## 2019-09-03 VITALS — BP 155/79 | HR 76 | Ht 63.5 in | Wt 147.4 lb

## 2019-09-03 DIAGNOSIS — I447 Left bundle-branch block, unspecified: Secondary | ICD-10-CM | POA: Diagnosis not present

## 2019-09-03 DIAGNOSIS — I10 Essential (primary) hypertension: Secondary | ICD-10-CM | POA: Diagnosis not present

## 2019-09-03 DIAGNOSIS — D508 Other iron deficiency anemias: Secondary | ICD-10-CM

## 2019-09-03 DIAGNOSIS — I4891 Unspecified atrial fibrillation: Secondary | ICD-10-CM

## 2019-09-03 DIAGNOSIS — I471 Supraventricular tachycardia: Secondary | ICD-10-CM

## 2019-09-03 DIAGNOSIS — E782 Mixed hyperlipidemia: Secondary | ICD-10-CM | POA: Diagnosis not present

## 2019-09-03 DIAGNOSIS — I4819 Other persistent atrial fibrillation: Secondary | ICD-10-CM | POA: Diagnosis not present

## 2019-09-03 DIAGNOSIS — I701 Atherosclerosis of renal artery: Secondary | ICD-10-CM

## 2019-09-03 MED ORDER — AMLODIPINE BESYLATE 10 MG PO TABS: 10.0000 mg | ORAL_TABLET | Freq: Every day | ORAL | 3 refills | Status: DC

## 2019-09-03 NOTE — Progress Notes (Signed)
Primary Care Provider: Vivi Barrack, MD Cardiologist: Glenetta Hew, MD Electrophysiologist:  Thompson Grayer, MD  Clinic Note: Chief Complaint  Patient presents with  . Follow-up    Annual  . Hypertension    Labile  . Atrial Fibrillation    Status post ablation    HPI:    Isabella Wright is a 74 y.o. female with a PMH notable for persistent A. fib status post ablation no longer on antirhythmic therapy.  On Xarelto.  She  presents today for annual follow-up.   Mahogony KAMI MAULDEN was last seen by me February 2020.  Was just seen by Dr. Rayann Heman via telemedicine visit on June 03, 2019.  During that visit she noted feeling quite well.  No further episodes of A. fib.  Had some issues with anemia that was getting better.  No other significant symptoms.  Recent Hospitalizations: None --> Is being evaluated and treated for microcytic anemia.   Reviewed  CV studies:    The following studies were reviewed today: (if available, images/films reviewed: From Epic Chart or Care Everywhere) . None:   Interval History:   Isabella Wright returns today overall feeling quite well.  She is excited to announce that she has had both of her COVID-19 vaccines.  Very happy about that.  She says that she still has her off and on intermittent issues with anxiety and stress.  She seems to stress out quite a lot and her blood pressure goes up and down a lot associated with it.  She has not noticed any palpitations or rapid irregular heartbeats however.  She is try to stay active, but not able to do amount of exercise that she usually does.  She walks still but is having issues with going to the gym for fear of Covid despite her having her vaccine.  Other than having some blood pressure issues of late, she does not have any cardiac symptoms.  CV Review of Symptoms (Summary): no chest pain or dyspnea on exertion positive for - Rare fleeting palpitations with anxiety or stress along with somewhat labile blood  pressures negative for - edema, orthopnea, paroxysmal nocturnal dyspnea, rapid heart rate, shortness of breath or Syncope/near syncope, TIA/amaurosis fugax, claudication  The patient does not have symptoms concerning for COVID-19 infection (fever, chills, cough, or new shortness of breath).  The patient is practicing social distancing & Masking.  Has had both COVID-19 vaccine shots   REVIEWED OF SYSTEMS   Review of Systems  Constitutional: Positive for weight loss. Negative for malaise/fatigue.  HENT: Negative for congestion and nosebleeds.   Respiratory: Negative for cough and shortness of breath.   Gastrointestinal: Negative for abdominal pain, blood in stool, heartburn and melena.  Genitourinary: Negative for hematuria.  Musculoskeletal: Positive for joint pain.  Neurological: Positive for headaches (Intermittent). Negative for dizziness, focal weakness and weakness.  Psychiatric/Behavioral: The patient is nervous/anxious.        Easily stressed, but not really with any depression symptoms.  Mild anxiety.   I have reviewed and (if needed) personally updated the patient's problem list, medications, allergies, past medical and surgical history, social and family history.   PAST MEDICAL HISTORY   Past Medical History:  Diagnosis Date  . Colon polyps 2016  . 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 Diabetes mellitus type 2, controlled ([place]) 2012  . GERD (gastroesophageal reflux disease)   . History of hiatal hernia   . Hypertension 2000   2009:  Renal Dopplers showed R RA ostial stenosis -- (told not a problem or 20 yrs)  . Hypertriglyceridemia 1996   Very labile and difficult control: Began in '96 when she began taking Premarin after hysterectomy -- triglycerides increased to 3582 neuropathy in her feet> care for by endocrinologis; levels vary based on stress.;; Labs from March 2015: TC 150, TG 745, HDL 26, LDL 45  . Iron deficiency anemia  04/30/2019  . Kidney stones   . LBBB (left bundle branch block) 2009   Initially diagnosed as rate related during stress echo.  . Macular degeneration    "dry in right; treating like wet in the left" (03/15/2016)  . Microcytic anemia 04/26/2019  . Paroxysmal SVT (supraventricular tachycardia) ([place]) 2009   less frequent following I&D of chest wall abscess (Jan 015), not previously documented, may have been afib.  . Personal history of atrial fibrillation 01/04/2016   a. s/p ablation in 04/2016  . Sarcoma ([place]) 2015   dermatofibro sarcoma protuberens left groin  . 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 ->  No evidence of acute cerebral infarct.  Brain MRI shows abnormal appearance of distal left vertebral artery flow.  . Vertebral artery occlusion, left    Noted on MRI/A of the neck in April 2018 --left vertebral artery occluded at origin and reconstitutes at level of V3.  This is similar to what was found on carotid Dopplers. -->  ASYMPTOMATIC    PAST SURGICAL HISTORY   Past Surgical History:  Procedure Laterality Date  . Abdominal Doppler ultrasound  February 2009    Right ostial renal artery stenosis; normal renal structure.  . APPENDECTOMY  1968  . CARDIAC ELECTROPHYSIOLOGY [place] AND ABLATION  2017  . CYSTOSCOPY W/ URETEROSCOPY W/ LITHOTRIPSY   1999   Large calcium oxalate stone  . CYSTOSCOPY/RETROGRADE/URETEROSCOPY/STONE EXTRACTION WITH BASKET  1972  . ELECTROPHYSIOLOGIC STUDY N/A 05/13/2016   Procedure: Atrial Fibrillation Ablation;  Surgeon: Thompson Grayer, MD;  Location: [facility];  Service: Cardiovascular;  Laterality: N/A;  . EXCISIONAL HEMORRHOIDECTOMY   1981  . FRACTURE SURGERY    . IRRIGATION AND DEBRIDEMENT SEBACEOUS CYST  January 2015    chest wall; with antibiotics --> following this treatment, SVT episodes became much less  frequent.  Marland Kitchen NM MYOVIEW LTD  12/2015   LOW RISK.  No ischemia or infarction.  Small septal-anterior defect likely related to left bundle branch block.  . ORIF PROXIMAL TIBIAL PLATEAU FRACTURE Left 2007   , Related shattered left tibial plateau with reattachment of torn ligaments and insertion of titanium plate and screws.  Marland Kitchen RENAL ARTERY DUPLEX  01/02/2014   R&L RA - mildly elevated velocities - < 60%; Bilateral Kidneys - normal shape & size.  Marland Kitchen SARCOMA EXCISION Left 2015   dermatofibro sarcoma protuberens, wide excision of groin from front side to buttocks"  . SOFT TISSUE BIOPSY Left 2015  . TOTAL ABDOMINAL HYSTERECTOMY  1994  . TRANSTHORACIC ECHOCARDIOGRAM  12/2015   Normal LV size and function.  EF 55%.  Mild LVH.  Septal-lateral dyssynchrony (from LBBB).  No significant valvular abnormalities.  Marland Kitchen TREADMILL STRESS ECHO  01/2009   No regional wall motion abnormalities with stress == non-ischemic; rate related incomplete left bundle branch block    MEDICATIONS/ALLERGIES   Current Meds  Medication Sig  . ACCU-CHEK AVIVA PLUS test strip 1  EACH BY OTHER ROUTE 4 (FOUR) TIMES DAILY. USE AS INSTRUCTED DIAG E11.65  . ACCU-CHEK SOFTCLIX LANCETS lancets 1 EACH BY OTHER ROUTE 3 (THREE) TIMES A DAY.  Marland Kitchen acetaminophen (TYLENOL) 325 MG tablet Take 2 tablets (650 mg total) by mouth every 4 (four) hours as needed for headache or mild pain.  . cholecalciferol (VITAMIN D) 1000 UNITS tablet Take 2,000 Units by mouth daily.   . Continuous Blood Gluc Sensor (FREESTYLE LIBRE SENSOR SYSTEM) MISC 1 EACH BY DOES NOT APPLY ROUTE EVERY 10 DAYS.  Marland Kitchen DILT-XR 240 MG 24 hr capsule TAKE 1 CAPSULE BY MOUTH EVERY DAY  . Dulaglutide (TRULICITY) A999333 [id number] SOPN Inject 0.75 mg into the skin once a week. Saturday  . fenofibrate 160 MG tablet TAKE 1 TABLET BY MOUTH EVERY DAY FOR HIGH TRIGLYCERIDES  . fluticasone (FLONASE) 50 MCG/ACT nasal spray Place 1-2 sprays into both nostrils at bedtime as needed for allergies or  rhinitis.  . hydrochlorothiazide (HYDRODIURIL) 12.5 MG tablet Take 1 tablet (12.5 mg total) by mouth daily.  . Insulin Aspart (NOVOLOG FLEXPEN [place]) Inject 10 Units into the skin 3 (three) times daily before meals. If blood sugar is higher than 120 give 10 units before each meal  . Insulin Detemir (LEVEMIR FLEXTOUCH) 100 UNIT/ML Pen Inject 50 Units into the skin at bedtime.   . metFORMIN (GLUCOPHAGE-XR) 500 MG 24 hr tablet Take 1 tablet (500 mg total) by mouth daily with breakfast. 1000 MG TAKE IN THE EVENING  . Multiple Vitamins-Minerals (PRESERVISION AREDS 2 PO) Take 1 tablet by mouth 2 (two) times daily.   Marland Kitchen omeprazole (PRILOSEC) 20 MG capsule Take 1 capsule (20 mg total) by mouth daily.  . ramipril (ALTACE) 10 MG capsule TAKE 2 CAPSULES BY MOUTH DAILY  . rosuvastatin (CRESTOR) 10 MG tablet TAKE 1 TABLET BY MOUTH EVERY DAY  . VASCEPA 1 g capsule TAKE 2 CAPSULES BY MOUTH 2 (TWO) TIMES DAILY.  Marland Kitchen XARELTO 20 MG TABS tablet TAKE 1 TABLET BY MOUTH DAILY WITH SUPPER LABS NEEDED FOR FURTHER REFILLS  . [DISCONTINUED] amLODipine (NORVASC) 5 MG tablet Take 1 tablet (5 mg total) by mouth daily.  . [DISCONTINUED] metoprolol tartrate (LOPRESSOR) 25 MG tablet Take 1 tablet (25 mg total) by mouth daily.    Allergies  Allergen Reactions  . Fluorescein Anaphylaxis    Pt states she had swelling of facial tissue with SOB and itching with a rapid heart rate.  . Premarin  [Conjugated Estrogens] Other (See Comments)  . Demerol [Meperidine] Nausea And Vomiting  . Exenatide Nausea And Vomiting  . Liraglutide Nausea And Vomiting    SOCIAL HISTORY/FAMILY HISTORY   Social History   Tobacco Use  . Smoking status: Former Smoker    Packs/day: 0.50    Years: 15.00    Pack years: 7.50    Types: Cigarettes  . Smokeless tobacco: Never Used  . Tobacco comment: "smoked in anesthesia school in the '70s; then just a social smoker til I quit"  Substance Use Topics  . Alcohol use: No  . 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 She is now widowed for just under 2 years. Her husband died of cancer. They do not have any children.   She recently (spring of 2015) moved to [place] to be closer to her brother, Isabella Wright and his family.   She been the caregiver for her ailing husband for 5  years. He passed away in August 2013, and she has had prolonged period of grieving partly due to her having lost 3 members of her immediate family during that time period as well.  --- She is under a lot of stress      She is a former smoker quit years ago. She does not drink alcohol. She is active, but not routine exercise. She says that she tries to eat healthy and knows what usually affects her triglyceride levels.    Family History family history includes Breast cancer in her sister; Heart disease in her brother, father, and mother.  OBJCTIVE -PE, EKG, labs   Wt Readings from Last 3 Encounters:  09/03/19 147 lb 6.4 oz (66.9 kg)  06/03/19 143 lb (64.9 kg)  04/22/19 145 lb (65.8 kg)    Physical Exam: BP (!) 155/79   Pulse 76   Ht 5' 3.5" (1.613 m)   Wt 147 lb 6.4 oz (66.9 kg)   SpO2 95%   BMI 25.70 kg/m  Physical Exam   Adult ECG Report  Rate: 76 ;  Rhythm: normal sinus rhythm and Left axis deviation.  LBBB.;   Narrative Interpretation: Stable EKG  Recent Labs: (Care Everywhere)   latest chemistries from October 2020-K+ 4.2.Cr 0.74.  Normal LFTs and other electrolytes.  Glucose 215  Lipid panel September 2020: TC 97, TG 401, HDL 26, LDL 55. Lab Results  Component Value Date   CHOL 96 04/13/2018   HDL 22 (A) 04/13/2018   LDLCALC NOT CALC 12/30/2013   TRIG 458 (A) 04/13/2018   Lab Results  Component Value Date   CREATININE 0.7 04/13/2018   BUN 14 04/13/2018   NA 140 04/13/2018   K 3.9 04/13/2018   CL 103 05/02/2016   CO2 28 05/02/2016   No results found for: TSH  ASSESSMENT/PLAN    Problem List  Items Addressed This Visit    Persistent atrial fibrillation ([place]) - s/p Ablation, miantaining NSR, CHA2DS2VASc 4 (on Xarelto) - Primary (Chronic)    No further breakthrough episodes since her ablation.  Remains on Lopressor at lower dose for baseline rate control along with Xarelto for anticoagulation.  Has been maintaining sinus rhythm  Would be acceptable to hold Xarelto 2 to 3 days preop for procedures or surgeries.       Relevant Medications   amLODipine (NORVASC) 10 MG tablet   Other Relevant Orders   EKG 12-Lead (Completed)   Essential hypertension (Chronic)    Blood pressure still going up.    Plan: Continue current meds but increase Norvasc to 10 mg.  She does have room to titrate metoprolol some, but would be leery of doing so in the absence of untoward symptoms.      Relevant Medications   amLODipine (NORVASC) 10 MG tablet   Renal artery stenosis ([facility]) (Chronic)    Dopplers showed no significant stenosis.  Unlikely related to hypertension.      Relevant Medications   amLODipine (NORVASC) 10 MG tablet   Mixed hyperlipidemia: High triglycerides, low HDL (Chronic)    She is doing great on combination of Crestor fenofibrate and Vascepa.  The only issue is here triglycerides are still high, likely related to diabetes.  She is on combination of fenofibrate and Vascepa for that.      Relevant Medications   amLODipine (NORVASC) 10 MG tablet   LBBB (left bundle branch block) (Chronic)   Relevant Medications   amLODipine (NORVASC) 10 MG tablet   Other Relevant Orders  EKG 12-Lead (Completed)   SVT (supraventricular tachycardia) ([facility]) (Chronic)    Symptoms seem to resolve since ablation.  No further recurrence.  Continue current dose of beta-blocker.      Relevant Medications   amLODipine (NORVASC) 10 MG tablet   Iron deficiency anemia (Chronic)    Being monitored by PCP.  If necessary to do procedures for evaluation, it would be acceptable to hold Xarelto.          COVID-19 Education: The signs and symptoms of COVID-19 were discussed with the patient and how to seek care for testing (follow up with PCP or arrange E-visit).   The importance of social distancing was discussed today.  I spent a total of 26 minutes with the patient. >  50% of the time was spent in direct patient consultation.  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  As such, we spent quite a long time discussing and answering her questions. Additional time spent with chart review  / charting (studies, outside notes, etc): 6 Total Time: 32 min   Current medicines are reviewed at length with the patient today.  (+/- concerns) n/a   Patient Instructions / Medication Changes & Studies & Tests Ordered   Patient Instructions  Medication Instructions:  - INCREASE AMLODIPINE 10 MG ONE TABLET DAILY  *If you need a refill on your cardiac medications before your next appointment, please call your pharmacy*  Lab Work: NOT NEEDED  Testing/Procedures: NOT NEEDED  Follow-Up: At [facility], you and your health needs are our priority.  As part of our continuing mission to provide you with exceptional heart care, we have created designated Provider Care Teams.  These Care Teams include your primary Cardiologist (physician) and Advanced Practice Providers (APPs -  Physician Assistants and Nurse Practitioners) who all work together to provide you with the care you need, when you need it.  Your next appointment:   12 month(s)- FEB 2022  The format for your next appointment:   In Person  Provider:   Glenetta Hew, MD  Other Instructions N/A    Studies Ordered:   Orders Placed This Encounter  Procedures  . EKG 12-Lead     Glenetta Hew, M.D., M.S. Interventional Cardiologist   Pager # [phone] Phone # [phone] [address] [place], [place] [address]   Thank you for choosing Heartcare at [facility]!!

## 2019-09-03 NOTE — Patient Instructions (Signed)
Medication Instructions:  - INCREASE AMLODIPINE 10 MG ONE TABLET DAILY  *If you need a refill on your cardiac medications before your next appointment, please call your pharmacy*  Lab Work: NOT NEEDED  Testing/Procedures: NOT NEEDED  Follow-Up: At [facility], you and your health needs are our priority.  As part of our continuing mission to provide you with exceptional heart care, we have created designated Provider Care Teams.  These Care Teams include your primary Cardiologist (physician) and Advanced Practice Providers (APPs -  Physician Assistants and Nurse Practitioners) who all work together to provide you with the care you need, when you need it.  Your next appointment:   12 month(s)- FEB 2022  The format for your next appointment:   In Person  Provider:   Glenetta Hew, MD  Other Instructions N/A

## 2019-09-06 DIAGNOSIS — Z8679 Personal history of other diseases of the circulatory system: Secondary | ICD-10-CM | POA: Diagnosis not present

## 2019-09-06 DIAGNOSIS — D509 Iron deficiency anemia, unspecified: Secondary | ICD-10-CM | POA: Diagnosis not present

## 2019-09-06 DIAGNOSIS — K219 Gastro-esophageal reflux disease without esophagitis: Secondary | ICD-10-CM | POA: Diagnosis not present

## 2019-09-06 DIAGNOSIS — Z86010 Personal history of colonic polyps: Secondary | ICD-10-CM | POA: Diagnosis not present

## 2019-09-10 ENCOUNTER — Encounter: Payer: Self-pay | Admitting: Cardiology

## 2019-09-10 NOTE — Assessment & Plan Note (Addendum)
Being monitored by PCP.  If necessary to do procedures for evaluation, it would be acceptable to hold Xarelto.

## 2019-09-10 NOTE — Assessment & Plan Note (Signed)
She is doing great on combination of Crestor fenofibrate and Vascepa.  The only issue is here triglycerides are still high, likely related to diabetes.  She is on combination of fenofibrate and Vascepa for that.

## 2019-09-10 NOTE — Assessment & Plan Note (Signed)
Symptoms seem to resolve since ablation.  No further recurrence.  Continue current dose of beta-blocker.

## 2019-09-10 NOTE — Assessment & Plan Note (Signed)
Dopplers showed no significant stenosis.  Unlikely related to hypertension.

## 2019-09-10 NOTE — Assessment & Plan Note (Signed)
Blood pressure still going up.    Plan: Continue current meds but increase Norvasc to 10 mg.  She does have room to titrate metoprolol some, but would be leery of doing so in the absence of untoward symptoms.

## 2019-09-10 NOTE — Assessment & Plan Note (Addendum)
No further breakthrough episodes since her ablation.  Remains on Lopressor at lower dose for baseline rate control along with Xarelto for anticoagulation.  Has been maintaining sinus rhythm  Would be acceptable to hold Xarelto 2 to 3 days preop for procedures or surgeries.

## 2019-09-18 ENCOUNTER — Other Ambulatory Visit: Payer: Self-pay | Admitting: Internal Medicine

## 2019-09-29 ENCOUNTER — Other Ambulatory Visit: Payer: Self-pay | Admitting: Cardiology

## 2019-09-29 ENCOUNTER — Other Ambulatory Visit: Payer: Self-pay | Admitting: Nurse Practitioner

## 2019-10-15 DIAGNOSIS — E782 Mixed hyperlipidemia: Secondary | ICD-10-CM | POA: Diagnosis not present

## 2019-10-15 DIAGNOSIS — E785 Hyperlipidemia, unspecified: Secondary | ICD-10-CM | POA: Diagnosis not present

## 2019-10-15 DIAGNOSIS — E1169 Type 2 diabetes mellitus with other specified complication: Secondary | ICD-10-CM | POA: Diagnosis not present

## 2019-10-22 DIAGNOSIS — H353114 Nonexudative age-related macular degeneration, right eye, advanced atrophic with subfoveal involvement: Secondary | ICD-10-CM | POA: Diagnosis not present

## 2019-10-22 DIAGNOSIS — H353221 Exudative age-related macular degeneration, left eye, with active choroidal neovascularization: Secondary | ICD-10-CM | POA: Diagnosis not present

## 2019-10-22 DIAGNOSIS — H43813 Vitreous degeneration, bilateral: Secondary | ICD-10-CM | POA: Diagnosis not present

## 2019-12-30 ENCOUNTER — Other Ambulatory Visit (HOSPITAL_COMMUNITY): Payer: Self-pay | Admitting: *Deleted

## 2019-12-30 MED ORDER — DILTIAZEM HCL ER 240 MG PO CP24: ORAL_CAPSULE | ORAL | 6 refills | Status: DC

## 2020-01-10 ENCOUNTER — Other Ambulatory Visit: Payer: Self-pay

## 2020-01-10 MED ORDER — HYDROCHLOROTHIAZIDE 12.5 MG PO TABS: 12.5000 mg | ORAL_TABLET | Freq: Every day | ORAL | 2 refills | Status: DC

## 2020-01-21 DIAGNOSIS — H43813 Vitreous degeneration, bilateral: Secondary | ICD-10-CM | POA: Diagnosis not present

## 2020-01-21 DIAGNOSIS — H353221 Exudative age-related macular degeneration, left eye, with active choroidal neovascularization: Secondary | ICD-10-CM | POA: Diagnosis not present

## 2020-01-21 DIAGNOSIS — H2513 Age-related nuclear cataract, bilateral: Secondary | ICD-10-CM | POA: Diagnosis not present

## 2020-01-21 DIAGNOSIS — H353114 Nonexudative age-related macular degeneration, right eye, advanced atrophic with subfoveal involvement: Secondary | ICD-10-CM | POA: Diagnosis not present

## 2020-01-31 DIAGNOSIS — E1169 Type 2 diabetes mellitus with other specified complication: Secondary | ICD-10-CM | POA: Diagnosis not present

## 2020-01-31 DIAGNOSIS — E785 Hyperlipidemia, unspecified: Secondary | ICD-10-CM | POA: Diagnosis not present

## 2020-02-11 ENCOUNTER — Other Ambulatory Visit: Payer: Self-pay | Admitting: Family Medicine

## 2020-02-12 ENCOUNTER — Encounter: Payer: Self-pay | Admitting: Family Medicine

## 2020-02-12 ENCOUNTER — Other Ambulatory Visit: Payer: Self-pay

## 2020-02-12 ENCOUNTER — Ambulatory Visit (INDEPENDENT_AMBULATORY_CARE_PROVIDER_SITE_OTHER): Payer: Medicare Other | Admitting: Family Medicine

## 2020-02-12 VITALS — BP 126/72 | HR 68 | Temp 97.2°F | Ht 63.5 in | Wt 144.5 lb

## 2020-02-12 DIAGNOSIS — E118 Type 2 diabetes mellitus with unspecified complications: Secondary | ICD-10-CM | POA: Diagnosis not present

## 2020-02-12 DIAGNOSIS — Z794 Long term (current) use of insulin: Secondary | ICD-10-CM | POA: Diagnosis not present

## 2020-02-12 DIAGNOSIS — I701 Atherosclerosis of renal artery: Secondary | ICD-10-CM | POA: Diagnosis not present

## 2020-02-12 DIAGNOSIS — I10 Essential (primary) hypertension: Secondary | ICD-10-CM

## 2020-02-12 MED ORDER — HYDRALAZINE HCL 25 MG PO TABS: 25.0000 mg | ORAL_TABLET | Freq: Three times a day (TID) | ORAL | 3 refills | Status: DC | PRN

## 2020-02-12 NOTE — Patient Instructions (Signed)
It was very nice to see you today!  I will send in hydralazine.  Please use if your blood pressure become significantly elevated again.  Please give our office a call if this happens.  Take care, Dr Jerline Pain  Please try these tips to maintain a healthy lifestyle:   Eat at least 3 REAL meals and 1-2 snacks per day.  Aim for no more than 5 hours between eating.  If you eat breakfast, please do so within one hour of getting up.    Each meal should contain half fruits/vegetables, one quarter protein, and one quarter carbs (no bigger than a computer mouse)   Cut down on sweet beverages. This includes juice, soda, and sweet tea.     Drink at least 1 glass of water with each meal and aim for at least 8 glasses per day   Exercise at least 150 minutes every week.
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
## 2020-02-12 NOTE — Assessment & Plan Note (Signed)
Recent ultrasound without significant stenosis.

## 2020-02-12 NOTE — Assessment & Plan Note (Signed)
At goal today.  We'll continue hydrochlorothiazide 12.5 mg daily and ramipril 20 mg daily.  Unclear why she had sudden elevation a couple weeks ago.  Does not currently have any symptoms of blood pressure is at goal.  She would like that as needed medication.  Will send in hydralazine.  Discussed reasons return to care.

## 2020-02-12 NOTE — Assessment & Plan Note (Signed)
Last A1c 7.0.  This is available in Care Everywhere.

## 2020-03-02 DIAGNOSIS — Z1231 Encounter for screening mammogram for malignant neoplasm of breast: Secondary | ICD-10-CM | POA: Diagnosis not present

## 2020-03-02 LAB — HM MAMMOGRAPHY

## 2020-03-03 ENCOUNTER — Other Ambulatory Visit: Payer: Self-pay | Admitting: Nurse Practitioner

## 2020-03-03 DIAGNOSIS — I4891 Unspecified atrial fibrillation: Secondary | ICD-10-CM

## 2020-03-04 ENCOUNTER — Encounter: Payer: Self-pay | Admitting: Family Medicine

## 2020-03-11 DIAGNOSIS — L820 Inflamed seborrheic keratosis: Secondary | ICD-10-CM | POA: Diagnosis not present

## 2020-03-11 DIAGNOSIS — L219 Seborrheic dermatitis, unspecified: Secondary | ICD-10-CM | POA: Diagnosis not present

## 2020-03-16 ENCOUNTER — Other Ambulatory Visit: Payer: Self-pay | Admitting: Cardiology

## 2020-03-18 DIAGNOSIS — C44799 Other specified malignant neoplasm of skin of left lower limb, including hip: Secondary | ICD-10-CM | POA: Diagnosis not present

## 2020-03-18 DIAGNOSIS — Z9189 Other specified personal risk factors, not elsewhere classified: Secondary | ICD-10-CM | POA: Diagnosis not present

## 2020-03-18 DIAGNOSIS — D500 Iron deficiency anemia secondary to blood loss (chronic): Secondary | ICD-10-CM | POA: Diagnosis not present

## 2020-03-18 DIAGNOSIS — I4891 Unspecified atrial fibrillation: Secondary | ICD-10-CM | POA: Diagnosis not present

## 2020-03-18 DIAGNOSIS — I471 Supraventricular tachycardia: Secondary | ICD-10-CM | POA: Diagnosis not present

## 2020-03-18 DIAGNOSIS — I701 Atherosclerosis of renal artery: Secondary | ICD-10-CM | POA: Diagnosis not present

## 2020-03-18 DIAGNOSIS — I4819 Other persistent atrial fibrillation: Secondary | ICD-10-CM | POA: Diagnosis not present

## 2020-03-18 DIAGNOSIS — I10 Essential (primary) hypertension: Secondary | ICD-10-CM | POA: Diagnosis not present

## 2020-03-18 DIAGNOSIS — D509 Iron deficiency anemia, unspecified: Secondary | ICD-10-CM | POA: Diagnosis not present

## 2020-03-18 DIAGNOSIS — K219 Gastro-esophageal reflux disease without esophagitis: Secondary | ICD-10-CM | POA: Diagnosis not present

## 2020-03-18 DIAGNOSIS — E1100 Type 2 diabetes mellitus with hyperosmolarity without nonketotic hyperglycemic-hyperosmolar coma (NKHHC): Secondary | ICD-10-CM | POA: Diagnosis not present

## 2020-03-18 LAB — CBC AND DIFFERENTIAL
HCT: 44 (ref 36–46)
Hemoglobin: 15.1 (ref 12.0–16.0)
Platelets: 185 (ref 150–399)
WBC: 4.8

## 2020-03-18 LAB — CBC: RBC: 5.15 — AB (ref 3.87–5.11)

## 2020-03-20 DIAGNOSIS — H524 Presbyopia: Secondary | ICD-10-CM | POA: Diagnosis not present

## 2020-03-20 DIAGNOSIS — H353114 Nonexudative age-related macular degeneration, right eye, advanced atrophic with subfoveal involvement: Secondary | ICD-10-CM | POA: Diagnosis not present

## 2020-03-20 DIAGNOSIS — H5203 Hypermetropia, bilateral: Secondary | ICD-10-CM | POA: Diagnosis not present

## 2020-03-20 DIAGNOSIS — H52223 Regular astigmatism, bilateral: Secondary | ICD-10-CM | POA: Diagnosis not present

## 2020-04-06 ENCOUNTER — Telehealth: Payer: Self-pay

## 2020-04-06 NOTE — Telephone Encounter (Signed)
Spoke with patient, she agreed to be seen by any of our provider here or other clinic, no virtual visit. Please schedule her

## 2020-04-06 NOTE — Telephone Encounter (Signed)
Pt woke up in the middle of the night with a severe headache. Everything she took wouldn't touch it. 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 She is requesting to be checked out this week. We have no openings. Please Advise

## 2020-04-06 NOTE — Telephone Encounter (Signed)
Pt scheduled  

## 2020-04-07 ENCOUNTER — Encounter: Payer: Self-pay | Admitting: Family Medicine

## 2020-04-08 ENCOUNTER — Other Ambulatory Visit: Payer: Self-pay

## 2020-04-08 ENCOUNTER — Encounter: Payer: Self-pay | Admitting: Family Medicine

## 2020-04-08 ENCOUNTER — Ambulatory Visit (INDEPENDENT_AMBULATORY_CARE_PROVIDER_SITE_OTHER): Payer: Medicare Other | Admitting: Family Medicine

## 2020-04-08 VITALS — BP 158/73 | HR 66 | Temp 98.2°F | Ht 63.5 in

## 2020-04-08 DIAGNOSIS — Z23 Encounter for immunization: Secondary | ICD-10-CM

## 2020-04-08 DIAGNOSIS — I6502 Occlusion and stenosis of left vertebral artery: Secondary | ICD-10-CM | POA: Diagnosis not present

## 2020-04-08 DIAGNOSIS — R42 Dizziness and giddiness: Secondary | ICD-10-CM

## 2020-04-08 DIAGNOSIS — I10 Essential (primary) hypertension: Secondary | ICD-10-CM

## 2020-04-08 NOTE — Patient Instructions (Signed)
It was very nice to see you today!  I think you are probably dehydrated.  Please make sure that you are getting plenty of fluids.  We will check an MRI to take a closer look at your vertebral arteries.  You should be called about getting this scheduled in the next couple of days.  Take care, Dr Jerline Pain  Please try these tips to maintain a healthy lifestyle:   Eat at least 3 REAL meals and 1-2 snacks per day.  Aim for no more than 5 hours between eating.  If you eat breakfast, please do so within one hour of getting up.    Each meal should contain half fruits/vegetables, one quarter protein, and one quarter carbs (no bigger than a computer mouse)   Cut down on sweet beverages. This includes juice, soda, and sweet tea.     Drink at least 1 glass of water with each meal and aim for at least 8 glasses per day   Exercise at least 150 minutes every week.

## 2020-04-08 NOTE — Progress Notes (Addendum)
   Isabella Wright is a 74 y.o. female who presents today for an office visit.  Assessment/Plan:  Headache/Dizziness Likely secondary to dehydration due to recent bout of GI foodborne illness.  She does have several other risk factors including strong family history of brain tumor and personal history of vertebral artery stenosis.  It is possible that she was experiencing subclavian steal syndrome during her exercise routine that was exacerbated by her dehydration.  In light of these other risk factors we will proceed with MRI of her neck to further evaluate her vertebral artery stenosis.  Also obtain brain MRI to rule out other potential etiologies.  Her neurologic exam today is reassuring.  Discussed reasons to return to care.  Essential hypertension Above goal though hesitant to increase meds given previous good control and concern for possible subclavian steal syndrome.  She will continue home monitoring.  Preventative Healthcare Flu vaccine given today.    Subjective:  HPI:  Patient here with dizziness and headache started about a week ago.  She had an episode of fall with likely food poisoning a few days prior to her symptoms starting.  Had persistent nausea and vomiting for a day or so.  Is also having some diarrhea.  She was working with her physical trainer about a week ago started having dizziness.  She stopped her workout and went home.  Later that night woke up with a severe headache.  Headache is mostly went away.  Dizziness is mostly when away as well.  She placed ice pack on the back of her head which seemed to help with a headache.  She has family history of brain tumor.  She is also has a personal history of vertebral artery stenosis that was diagnosed 2 years ago.       Objective:  Physical Exam: BP (!) 158/73   Pulse 66   Temp 98.2 F (36.8 C) (Temporal)   Ht 5' 3.5" (1.613 m)   SpO2 96%   BMI 25.20 kg/m   Gen: No acute distress, resting comfortably CV: Regular rate and  rhythm with no murmurs appreciated Pulm: Normal work of breathing, clear to auscultation bilaterally with no crackles, wheezes, or rhonchi Neuro: Cranial nerves II through XII intact.  Finger-nose-finger testing intact bilaterally.  Strength 5 out of 5 in upper and lower extremities.  Reflexes 2+ and symmetric in bilateral upper and lower extremities.  Sensation to light touch intact throughout. Neuro: Grossly normal, moves all extremities Psych: Normal affect and thought content  Time Spent: 42 minutes of total time was spent on the date of the encounter performing the following actions: chart review prior to seeing the patient including MRI from 2019 in care everywhere, obtaining history, performing a medically necessary exam, counseling on the treatment plan, placing orders, and documenting in our EHR.        Algis Greenhouse. Jerline Pain, MD 04/08/2020 8:40 AM

## 2020-04-08 NOTE — Assessment & Plan Note (Signed)
Above goal though hesitant to increase meds given previous good control and concern for possible subclavian steal syndrome.  She will continue home monitoring.

## 2020-04-20 ENCOUNTER — Other Ambulatory Visit: Payer: Self-pay

## 2020-04-20 ENCOUNTER — Ambulatory Visit (INDEPENDENT_AMBULATORY_CARE_PROVIDER_SITE_OTHER): Payer: Medicare Other

## 2020-04-20 ENCOUNTER — Telehealth: Payer: Self-pay | Admitting: *Deleted

## 2020-04-20 ENCOUNTER — Other Ambulatory Visit: Payer: Self-pay | Admitting: Family Medicine

## 2020-04-20 DIAGNOSIS — G08 Intracranial and intraspinal phlebitis and thrombophlebitis: Secondary | ICD-10-CM

## 2020-04-20 DIAGNOSIS — I6502 Occlusion and stenosis of left vertebral artery: Secondary | ICD-10-CM

## 2020-04-20 DIAGNOSIS — R42 Dizziness and giddiness: Secondary | ICD-10-CM | POA: Diagnosis not present

## 2020-04-20 DIAGNOSIS — I6509 Occlusion and stenosis of unspecified vertebral artery: Secondary | ICD-10-CM

## 2020-04-20 DIAGNOSIS — I6521 Occlusion and stenosis of right carotid artery: Secondary | ICD-10-CM | POA: Diagnosis not present

## 2020-04-20 MED ORDER — GADOBUTROL 1 MMOL/ML IV SOLN
6.5000 mL | Freq: Once | INTRAVENOUS | Status: AC | PRN
  Administered 2020-04-20: 6.5 mL via INTRAVENOUS

## 2020-04-20 NOTE — Progress Notes (Signed)
Please inform patient of the following:  MRI showed several findings that possible could explain some of her symptoms.  Radiology recommended an additional scan to rule out an occlusion - I will place the order.  She does have some stenosis in her vertebral arteries and a few other non specific findings. Recommend neurology referral to see if we need to do any further testing. I will place this referral.   She can schedule and OV if she has any further questions.  Algis Greenhouse. Jerline Pain, MD 04/20/2020 3:23 PM

## 2020-04-20 NOTE — Telephone Encounter (Signed)
Please see result note.  Isabella Wright. Jerline Pain, MD 04/20/2020 3:28 PM

## 2020-04-20 NOTE — Telephone Encounter (Signed)
Sherrill from radiology call with critical report  1-2 mm focus of right parietal restricted diffusion may reflect infarct versus artifact.  Recommended MRV of the head for further evaluation

## 2020-04-21 NOTE — Telephone Encounter (Signed)
Patient notified, schedule appointment with PCP

## 2020-04-23 ENCOUNTER — Other Ambulatory Visit: Payer: Self-pay

## 2020-04-23 ENCOUNTER — Telehealth: Payer: Self-pay

## 2020-04-23 DIAGNOSIS — R42 Dizziness and giddiness: Secondary | ICD-10-CM

## 2020-04-23 NOTE — Telephone Encounter (Signed)
New referral placed.

## 2020-04-23 NOTE — Telephone Encounter (Signed)
Patient had a referral to a [facility], but would prefer to see Dr.John Morris at [facility] -phone number is [phone].

## 2020-04-24 ENCOUNTER — Other Ambulatory Visit: Payer: Self-pay

## 2020-04-24 ENCOUNTER — Ambulatory Visit (INDEPENDENT_AMBULATORY_CARE_PROVIDER_SITE_OTHER): Payer: Medicare Other | Admitting: Family Medicine

## 2020-04-24 ENCOUNTER — Encounter: Payer: Self-pay | Admitting: Family Medicine

## 2020-04-24 VITALS — Temp 98.6°F | Ht 63.5 in

## 2020-04-24 DIAGNOSIS — R42 Dizziness and giddiness: Secondary | ICD-10-CM | POA: Diagnosis not present

## 2020-04-24 DIAGNOSIS — I6509 Occlusion and stenosis of unspecified vertebral artery: Secondary | ICD-10-CM | POA: Diagnosis not present

## 2020-04-24 NOTE — Progress Notes (Signed)
   Isabella Wright is a 74 y.o. female who presents today for an office visit.  Assessment/Plan:  New/Acute Problems: Dizziness / MRI Findings Extensively discussed MRI findings with patient. She is thankfully not had any further symptoms since her last visit. 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 Given her other additional findings including nonspecific white matter changes and possible concern for sagittal sinus occlusion, he elected to pursue further work-up at this point. She has a referral to neurology already pending. She also has MRV pending as well but will wait until she sees the neurologist to further pursue this.     Subjective:  HPI:  Patient here for MRI follow-up. She was seen about 2 weeks ago for episode of dizziness. She had known history of vertebral artery obstruction. We elected to obtain MRI with MRA at that time to rule out any other possible causes and to further evaluate progression of her vertebral artery stenosis. She has had well since her last episode. MRI was done 4 days ago which showed overall stable vertebral artery stenosis and internal carotid artery stenosis. She had a few additional concerning findings including concern for possible slow flow/occlusion and sagittal sinus, supratentorial white matter changes, and possible subacute infarct.       Objective:  Physical Exam: Temp 98.6 F (37 C) (Temporal)   Ht 5' 3.5" (1.613 m)   BMI 25.20 kg/m   Gen: No acute distress, resting comfortably Neuro: Grossly normal, moves all extremities Psych: Normal affect and thought content  Time Spent: 42 minutes of total time was spent on the date of the encounter performing the following actions: chart review prior to seeing the patient, obtaining history, performing a medically necessary exam, counseling on the treatment plan including referral and further imaging, placing orders, and documenting in our EHR.          Algis Greenhouse. Jerline Pain, MD 04/24/2020 2:27 PM

## 2020-04-24 NOTE — Patient Instructions (Addendum)
It was very nice to see you today!  Please give the neurologist a call to schedule your appointment.   Please wait on canceling the MRV until you have your appointment scheduled.  Take care, Dr Jerline Pain  Please try these tips to maintain a healthy lifestyle:   Eat at least 3 REAL meals and 1-2 snacks per day.  Aim for no more than 5 hours between eating.  If you eat breakfast, please do so within one hour of getting up.    Each meal should contain half fruits/vegetables, one quarter protein, and one quarter carbs (no bigger than a computer mouse)   Cut down on sweet beverages. This includes juice, soda, and sweet tea.     Drink at least 1 glass of water with each meal and aim for at least 8 glasses per day   Exercise at least 150 minutes every week.

## 2020-04-27 DIAGNOSIS — Z23 Encounter for immunization: Secondary | ICD-10-CM | POA: Diagnosis not present

## 2020-05-04 ENCOUNTER — Other Ambulatory Visit: Payer: Self-pay | Admitting: Family Medicine

## 2020-05-04 DIAGNOSIS — I6502 Occlusion and stenosis of left vertebral artery: Secondary | ICD-10-CM | POA: Diagnosis not present

## 2020-05-04 DIAGNOSIS — I10 Essential (primary) hypertension: Secondary | ICD-10-CM | POA: Diagnosis not present

## 2020-05-04 DIAGNOSIS — I4819 Other persistent atrial fibrillation: Secondary | ICD-10-CM | POA: Diagnosis not present

## 2020-05-04 DIAGNOSIS — Z8673 Personal history of transient ischemic attack (TIA), and cerebral infarction without residual deficits: Secondary | ICD-10-CM | POA: Diagnosis not present

## 2020-05-04 DIAGNOSIS — E782 Mixed hyperlipidemia: Secondary | ICD-10-CM | POA: Diagnosis not present

## 2020-05-04 DIAGNOSIS — E1100 Type 2 diabetes mellitus with hyperosmolarity without nonketotic hyperglycemic-hyperosmolar coma (NKHHC): Secondary | ICD-10-CM | POA: Diagnosis not present

## 2020-05-04 DIAGNOSIS — R42 Dizziness and giddiness: Secondary | ICD-10-CM | POA: Diagnosis not present

## 2020-05-04 DIAGNOSIS — E860 Dehydration: Secondary | ICD-10-CM | POA: Diagnosis not present

## 2020-05-05 DIAGNOSIS — K909 Intestinal malabsorption, unspecified: Secondary | ICD-10-CM | POA: Diagnosis not present

## 2020-05-05 DIAGNOSIS — E559 Vitamin D deficiency, unspecified: Secondary | ICD-10-CM | POA: Diagnosis not present

## 2020-05-05 DIAGNOSIS — E1169 Type 2 diabetes mellitus with other specified complication: Secondary | ICD-10-CM | POA: Diagnosis not present

## 2020-05-05 DIAGNOSIS — I482 Chronic atrial fibrillation, unspecified: Secondary | ICD-10-CM | POA: Diagnosis not present

## 2020-05-05 DIAGNOSIS — E785 Hyperlipidemia, unspecified: Secondary | ICD-10-CM | POA: Diagnosis not present

## 2020-05-06 ENCOUNTER — Telehealth: Payer: Self-pay

## 2020-05-06 NOTE — Telephone Encounter (Signed)
Patient is requesting her MVR results on Sunday be sent to dr.parker and dr.morris from [facility]

## 2020-05-08 ENCOUNTER — Other Ambulatory Visit: Payer: Self-pay | Admitting: *Deleted

## 2020-05-08 ENCOUNTER — Other Ambulatory Visit: Payer: Self-pay | Admitting: Family Medicine

## 2020-05-08 NOTE — Telephone Encounter (Signed)
She has her scan on 10/24 - this will automatically come to me. Dr Lynnette Caffey should be able to see in Care Everywhere but we can send hard copies if needed.  Algis Greenhouse. Jerline Pain, MD 05/08/2020 9:19 AM

## 2020-05-08 NOTE — Telephone Encounter (Signed)
Pt requesting results

## 2020-05-10 ENCOUNTER — Other Ambulatory Visit: Payer: Self-pay

## 2020-05-10 ENCOUNTER — Ambulatory Visit
Admission: RE | Admit: 2020-05-10 | Discharge: 2020-05-10 | Disposition: A | Discharge status: Home / Self Care | Payer: Medicare Other | Source: Ambulatory Visit | Attending: Family Medicine | Admitting: Family Medicine

## 2020-05-10 DIAGNOSIS — G08 Intracranial and intraspinal phlebitis and thrombophlebitis: Secondary | ICD-10-CM

## 2020-05-10 DIAGNOSIS — R519 Headache, unspecified: Secondary | ICD-10-CM | POA: Diagnosis not present

## 2020-05-10 DIAGNOSIS — R42 Dizziness and giddiness: Secondary | ICD-10-CM | POA: Diagnosis not present

## 2020-05-11 NOTE — Progress Notes (Signed)
Please inform patient of the following:  Her MRV is NORMAL. Her neurosurgeon should be able to see these results in care everywhere but we can send physical copy if patient wishes.  Algis Greenhouse. Jerline Pain, MD 05/11/2020 8:08 AM

## 2020-05-12 DIAGNOSIS — H43813 Vitreous degeneration, bilateral: Secondary | ICD-10-CM | POA: Diagnosis not present

## 2020-05-12 DIAGNOSIS — H353221 Exudative age-related macular degeneration, left eye, with active choroidal neovascularization: Secondary | ICD-10-CM | POA: Diagnosis not present

## 2020-05-12 DIAGNOSIS — H353114 Nonexudative age-related macular degeneration, right eye, advanced atrophic with subfoveal involvement: Secondary | ICD-10-CM | POA: Diagnosis not present

## 2020-05-13 ENCOUNTER — Other Ambulatory Visit: Payer: Self-pay

## 2020-05-13 MED ORDER — DILTIAZEM HCL ER 240 MG PO CP24: ORAL_CAPSULE | ORAL | 0 refills | Status: DC

## 2020-06-10 DIAGNOSIS — I447 Left bundle-branch block, unspecified: Secondary | ICD-10-CM | POA: Diagnosis not present

## 2020-06-10 DIAGNOSIS — E119 Type 2 diabetes mellitus without complications: Secondary | ICD-10-CM | POA: Diagnosis not present

## 2020-06-10 DIAGNOSIS — I10 Essential (primary) hypertension: Secondary | ICD-10-CM | POA: Diagnosis not present

## 2020-06-10 DIAGNOSIS — E782 Mixed hyperlipidemia: Secondary | ICD-10-CM | POA: Diagnosis not present

## 2020-06-10 DIAGNOSIS — Z8679 Personal history of other diseases of the circulatory system: Secondary | ICD-10-CM | POA: Diagnosis not present

## 2020-06-10 DIAGNOSIS — Z794 Long term (current) use of insulin: Secondary | ICD-10-CM | POA: Diagnosis not present

## 2020-06-15 ENCOUNTER — Other Ambulatory Visit: Payer: Self-pay

## 2020-06-15 ENCOUNTER — Encounter: Payer: Self-pay | Admitting: Internal Medicine

## 2020-06-15 ENCOUNTER — Ambulatory Visit (INDEPENDENT_AMBULATORY_CARE_PROVIDER_SITE_OTHER): Payer: Medicare Other | Admitting: Internal Medicine

## 2020-06-15 VITALS — BP 172/94 | HR 70 | Ht 63.5 in | Wt 144.8 lb

## 2020-06-15 DIAGNOSIS — I4819 Other persistent atrial fibrillation: Secondary | ICD-10-CM | POA: Diagnosis not present

## 2020-06-15 DIAGNOSIS — I701 Atherosclerosis of renal artery: Secondary | ICD-10-CM | POA: Diagnosis not present

## 2020-06-15 DIAGNOSIS — D6869 Other thrombophilia: Secondary | ICD-10-CM | POA: Diagnosis not present

## 2020-06-15 DIAGNOSIS — I10 Essential (primary) hypertension: Secondary | ICD-10-CM

## 2020-06-15 NOTE — Progress Notes (Signed)
PCP: Vivi Barrack, MD Primary Cardiologist: Dr Ellyn Hack previously (currently seeing Dr Mauricio Po at [place]) Primary EP: Dr Kenn File Isabella Wright is a 74 y.o. female who presents today for routine electrophysiology followup.  Since last being seen in our clinic, the patient reports doing very well.  She has had issues primarily with ongoing elevation in her BP.  She is seeing Dr Mauricio Po at [facility] and has testing/ therapy planned.  She has also seen Dr Lynnette Caffey with neurology and was told that she has had numerous small vessel strokes. Today, she denies symptoms of palpitations, chest pain, shortness of breath,  lower extremity edema, dizziness, presyncope, or syncope.  The patient is otherwise without complaint today.   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   Diabetes mellitus type 2, controlled ([place]) 2012   GERD (gastroesophageal reflux disease)    History of hiatal hernia    Hypertension 2000   2009: Renal Dopplers showed R RA ostial stenosis -- (told not a problem or 20 yrs)   Hypertriglyceridemia 1996   Very labile and difficult control: Began in '96 when she began taking Premarin after hysterectomy -- triglycerides increased to 3582 neuropathy in her feet> care for by endocrinologis; levels vary based on stress.;; Labs from March 2015: TC 150, TG 745, HDL 26, LDL 45   Iron deficiency anemia 04/30/2019   Kidney stones    LBBB (left bundle branch block) 2009   Initially diagnosed as rate related during stress echo.   Macular degeneration    "dry in right; treating like wet in the left" (03/15/2016)   Microcytic anemia 04/26/2019   Paroxysmal SVT (supraventricular tachycardia) ([place]) 2009   less frequent following I&D of chest wall abscess (Jan 015), not previously documented, may have been afib.   Personal history of atrial fibrillation 01/04/2016   a. 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 ->  No evidence of acute cerebral infarct.  Brain MRI shows abnormal appearance of distal left vertebral artery flow.   Vertebral artery occlusion, left    Noted on MRI/A of the neck in April 2018 --left vertebral artery occluded at origin and reconstitutes at level of V3.  This is similar to what was found on carotid Dopplers. -->  ASYMPTOMATIC   Past Surgical History:  Procedure Laterality Date   Abdominal Doppler ultrasound  February 2009    Right ostial renal artery stenosis; normal renal structure.   APPENDECTOMY  1968   CARDIAC ELECTROPHYSIOLOGY MAPPING AND ABLATION  2017   CYSTOSCOPY W/ URETEROSCOPY W/ LITHOTRIPSY   1999   Large calcium oxalate stone   CYSTOSCOPY/RETROGRADE/URETEROSCOPY/STONE EXTRACTION WITH BASKET  1972   ELECTROPHYSIOLOGIC STUDY N/A 05/13/2016   Procedure: Atrial Fibrillation Ablation;  Surgeon: Thompson Grayer, MD;  Location: [facility];  Service: Cardiovascular;  Laterality: N/A;   EXCISIONAL HEMORRHOIDECTOMY   1981   FRACTURE SURGERY     IRRIGATION AND DEBRIDEMENT SEBACEOUS CYST  January 2015    chest wall; with antibiotics --> following this treatment, SVT episodes became much less frequent.   NM MYOVIEW LTD  12/2015   LOW RISK.  No ischemia or infarction.  Small  septal-anterior defect likely related to left bundle branch block.   ORIF PROXIMAL TIBIAL PLATEAU FRACTURE Left 2007   , Related shattered left tibial plateau with reattachment of torn ligaments and insertion of titanium plate and screws.   RENAL ARTERY DUPLEX  01/02/2014   R&L RA - mildly elevated velocities - < 60%; Bilateral Kidneys - normal shape & size.   SARCOMA EXCISION Left 2015   dermatofibro sarcoma protuberens, wide excision of groin  from front side to buttocks"   SOFT TISSUE BIOPSY Left 2015   TOTAL ABDOMINAL HYSTERECTOMY  1994   TRANSTHORACIC ECHOCARDIOGRAM  12/2015   Normal LV size and function.  EF 55%.  Mild LVH.  Septal-lateral dyssynchrony (from LBBB).  No significant valvular abnormalities.   TREADMILL STRESS ECHO  01/2009   No regional wall motion abnormalities with stress == non-ischemic; rate related incomplete left bundle branch block    ROS- all systems are reviewed and negatives except as per HPI above  Current Outpatient Medications  Medication Sig Dispense Refill   ACCU-CHEK AVIVA PLUS test strip 1 EACH BY OTHER ROUTE 4 (FOUR) TIMES DAILY. USE AS INSTRUCTED DIAG E11.65  5   ACCU-CHEK SOFTCLIX LANCETS lancets 1 EACH BY OTHER ROUTE 3 (THREE) TIMES A DAY.  5   acetaminophen (TYLENOL) 325 MG tablet Take 2 tablets (650 mg total) by mouth every 4 (four) hours as needed for headache or mild pain.     cholecalciferol (VITAMIN D) 1000 UNITS tablet Take 2,000 Units by mouth daily.      Continuous Blood Gluc Sensor (FREESTYLE LIBRE SENSOR SYSTEM) MISC 1 EACH BY DOES NOT APPLY ROUTE EVERY 10 DAYS.  99   diltiazem (DILT-XR) 240 MG 24 hr capsule TAKE 1 CAPSULE BY MOUTH EVERY DAY. Please keep upcoming appt in November with Dr. Rayann Heman for future refills. Thank you 90 capsule 0   Dulaglutide (TRULICITY) 1.5 TO/6.7TI SOPN Inject 1.5 mg into the skin once a week.     fenofibrate 160 MG tablet TAKE 1 TABLET BY MOUTH EVERY DAY FOR high triglycerides 90 tablet 1   fluticasone (FLONASE) 50 MCG/ACT nasal spray Place 1-2 sprays into both nostrils at bedtime as needed for allergies or rhinitis.     hydrALAZINE (APRESOLINE) 25 MG tablet Take 1 tablet (25 mg total) by mouth 3 (three) times daily as needed. For BP > 180/120 90 tablet 3   hydrochlorothiazide (HYDRODIURIL) 12.5 MG tablet Take 1 tablet (12.5 mg total) by mouth daily. 90 tablet 2   Insulin Aspart (NOVOLOG FLEXPEN [place]) Inject 10 Units into the skin 3 (three)  times daily before meals. If blood sugar is higher than 120 give 10 units before each meal     Insulin Detemir (LEVEMIR FLEXTOUCH) 100 UNIT/ML Pen Inject 50 Units into the skin at bedtime.      Insulin Pen Needle (NOVOFINE PEN NEEDLE) 32G X 6 MM MISC USE WITH INSULIN INJECTIONS 3 TIMES DAILY     Insulin Pen Needle (NOVOFINE) 32G X 6 MM MISC SMARTSIG:Injection 3 Times Daily     ketoconazole (NIZORAL) 2 % shampoo SMARTSIG:Topical 2-3 Times Weekly     metFORMIN (GLUCOPHAGE-XR) 500 MG 24 hr tablet Take 1 tablet (500 mg total) by mouth daily with breakfast. 1000 MG TAKE IN THE EVENING     metoprolol tartrate (LOPRESSOR) 25 MG tablet TAKE 1 TABLET BY MOUTH EVERY DAY 90 tablet 1   Multiple Vitamins-Minerals (PRESERVISION AREDS 2 PO) Take 1 tablet by mouth 2 (two) times daily.  neomycin-polymyxin b-dexamethasone (MAXITROL) 3.5-10000-0.1 OINT Place 1 application into the left eye 2 (two) times daily.     neomycin-polymyxin-dexameth (MAXITROL) 0.1 % OINT Place 1 application into the left eye 2 (two) times daily.     omeprazole (PRILOSEC) 20 MG capsule TAKE 1 CAPSULE BY MOUTH EVERY DAY 90 capsule 2   ramipril (ALTACE) 10 MG capsule TAKE 2 CAPSULES BY MOUTH DAILY 180 capsule 3   rosuvastatin (CRESTOR) 10 MG tablet TAKE 1 TABLET BY MOUTH EVERY DAY 90 tablet 2   tobramycin (TOBREX) 0.3 % ophthalmic solution Place into the left eye.     tobramycin (TOBREX) 0.3 % ophthalmic solution Place into the left eye.     VASCEPA 1 g capsule TAKE 2 CAPSULES BY MOUTH 2 (TWO) TIMES DAILY. 120 capsule 6   XARELTO 20 MG TABS tablet TAKE 1 TABLET BY MOUTH DAILY WITH SUPPER. LABS NEEDED FOR FURTHER REFILLS 30 tablet 0   No current facility-administered medications for this visit.    Physical Exam: Vitals:   06/15/20 1136  BP: (!) 172/94  Pulse: 70  SpO2: 97%  Weight: 144 lb 12.8 oz (65.7 kg)  Height: 5' 3.5" (1.613 m)    GEN- The patient is well appearing, alert and oriented x 3 today.   Head-  normocephalic, atraumatic Eyes-  Sclera clear, conjunctiva pink Ears- hearing intact Oropharynx- clear Lungs- Clear to ausculation bilaterally, normal work of breathing Heart- Regular rate and rhythm, no murmurs, rubs or gallops, PMI not laterally displaced GI- soft, NT, ND, + BS Extremities- no clubbing, cyanosis, or edema  Wt Readings from Last 3 Encounters:  06/15/20 144 lb 12.8 oz (65.7 kg)  02/12/20 144 lb 8 oz (65.5 kg)  09/03/19 147 lb 6.4 oz (66.9 kg)    EKG tracing ordered today is personally reviewed and shows sinus rhythm with LBBB  Assessment and Plan:  1. Persistent afib She has done very well post ablation off AAD therapy chads2vasc score is 4.  She is on xarelto  2. CAD No ischemic symptoms  3. HTN Elevated She has ongoing workup with Dr Mauricio Po I will defer management to him   Risks, benefits and potential toxicities for medications prescribed and/or refilled reviewed with patient today.   Return to see EP PA in a year  Thompson Grayer MD, [facility] 06/15/2020 11:38 AM

## 2020-06-15 NOTE — Patient Instructions (Addendum)
Medication Instructions:  Your physician recommends that you continue on your current medications as directed. Please refer to the Current Medication list given to you today.  *If you need a refill on your cardiac medications before your next appointment, please call your pharmacy*  Lab Work: None ordered.  If you have labs (blood work) drawn today and your tests are completely normal, you will receive your results only by: Marland Kitchen MyChart Message (if you have MyChart) OR . A paper copy in the mail If you have any lab test that is abnormal or we need to change your treatment, we will call you to review the results.  Testing/Procedures: None ordered.  Follow-Up: At [facility], you and your health needs are our priority.  As part of our continuing mission to provide you with exceptional heart care, we have created designated Provider Care Teams.  These Care Teams include your primary Cardiologist (physician) and Advanced Practice Providers (APPs -  Physician Assistants and Nurse Practitioners) who all work together to provide you with the care you need, when you need it.  We recommend signing up for the patient portal called "MyChart".  Sign up information is provided on this After Visit Summary.  MyChart is used to connect with patients for Virtual Visits (Telemedicine).  Patients are able to view lab/test results, encounter notes, upcoming appointments, etc.  Non-urgent messages can be sent to your provider as well.   To learn more about what you can do with MyChart, go to [email or web address].    Your next appointment:   Your physician wants you to follow-up in: 1 year with Tommye Standard. You will receive a reminder letter in the mail two months in advance. If you don't receive a letter, please call our office to schedule the follow-up appointment.    Other Instructions:

## 2020-06-22 ENCOUNTER — Other Ambulatory Visit: Payer: Self-pay | Admitting: Cardiology

## 2020-06-22 DIAGNOSIS — I447 Left bundle-branch block, unspecified: Secondary | ICD-10-CM | POA: Diagnosis not present

## 2020-06-22 DIAGNOSIS — E119 Type 2 diabetes mellitus without complications: Secondary | ICD-10-CM | POA: Diagnosis not present

## 2020-06-22 DIAGNOSIS — Z8679 Personal history of other diseases of the circulatory system: Secondary | ICD-10-CM | POA: Diagnosis not present

## 2020-06-22 DIAGNOSIS — I10 Essential (primary) hypertension: Secondary | ICD-10-CM | POA: Diagnosis not present

## 2020-06-22 DIAGNOSIS — Z794 Long term (current) use of insulin: Secondary | ICD-10-CM | POA: Diagnosis not present

## 2020-07-03 DIAGNOSIS — I10 Essential (primary) hypertension: Secondary | ICD-10-CM | POA: Diagnosis not present

## 2020-07-07 ENCOUNTER — Telehealth: Payer: Self-pay

## 2020-07-07 MED ORDER — RIVAROXABAN 20 MG PO TABS
ORAL_TABLET | ORAL | 1 refills | Status: DC
Start: 2020-07-07 — End: 2020-10-21

## 2020-07-07 NOTE — Telephone Encounter (Signed)
refil for xarelto filled

## 2020-07-13 DIAGNOSIS — L218 Other seborrheic dermatitis: Secondary | ICD-10-CM | POA: Diagnosis not present

## 2020-07-13 DIAGNOSIS — D225 Melanocytic nevi of trunk: Secondary | ICD-10-CM | POA: Diagnosis not present

## 2020-07-13 DIAGNOSIS — L820 Inflamed seborrheic keratosis: Secondary | ICD-10-CM | POA: Diagnosis not present

## 2020-07-13 DIAGNOSIS — L812 Freckles: Secondary | ICD-10-CM | POA: Diagnosis not present

## 2020-07-13 DIAGNOSIS — L300 Nummular dermatitis: Secondary | ICD-10-CM | POA: Diagnosis not present

## 2020-07-13 DIAGNOSIS — L821 Other seborrheic keratosis: Secondary | ICD-10-CM | POA: Diagnosis not present

## 2020-08-07 ENCOUNTER — Ambulatory Visit: Payer: Medicare Other

## 2020-08-10 DIAGNOSIS — E782 Mixed hyperlipidemia: Secondary | ICD-10-CM | POA: Diagnosis not present

## 2020-08-10 DIAGNOSIS — E785 Hyperlipidemia, unspecified: Secondary | ICD-10-CM | POA: Diagnosis not present

## 2020-08-10 DIAGNOSIS — E1169 Type 2 diabetes mellitus with other specified complication: Secondary | ICD-10-CM | POA: Diagnosis not present

## 2020-08-11 DIAGNOSIS — H353114 Nonexudative age-related macular degeneration, right eye, advanced atrophic with subfoveal involvement: Secondary | ICD-10-CM | POA: Diagnosis not present

## 2020-08-11 DIAGNOSIS — H353221 Exudative age-related macular degeneration, left eye, with active choroidal neovascularization: Secondary | ICD-10-CM | POA: Diagnosis not present

## 2020-08-13 DIAGNOSIS — L820 Inflamed seborrheic keratosis: Secondary | ICD-10-CM | POA: Diagnosis not present

## 2020-08-13 DIAGNOSIS — L821 Other seborrheic keratosis: Secondary | ICD-10-CM | POA: Diagnosis not present

## 2020-08-14 ENCOUNTER — Other Ambulatory Visit: Payer: Self-pay

## 2020-08-14 ENCOUNTER — Ambulatory Visit (INDEPENDENT_AMBULATORY_CARE_PROVIDER_SITE_OTHER): Payer: Medicare Other

## 2020-08-14 DIAGNOSIS — Z0000 Encounter for general adult medical examination without abnormal findings: Secondary | ICD-10-CM

## 2020-08-14 NOTE — Progress Notes (Signed)
Virtual Visit via Telephone Note  I connected with  Isabella Wright on 08/14/20 at  1:00 PM EST by telephone and verified that I am speaking with the correct person using two identifiers.  Medicare Annual Wellness visit completed telephonically due to Covid-19 pandemic.   Persons participating in this call: This Health Coach and this patient.   Location: Patient: Home Provider: Office   I discussed the limitations, risks, security and privacy concerns of performing an evaluation and management service by telephone and the availability of in person appointments. The patient expressed understanding and agreed to proceed.  Unable to perform video visit due to video visit attempted and failed and/or patient does not have video capability.   Some vital signs may be absent or patient reported.   Willette Brace, LPN    Subjective:   Isabella Wright is a 75 y.o. female who presents for an Initial Medicare Annual Wellness Visit.  Review of Systems     Cardiac Risk Factors include: advanced age (>68men, >44 women);hypertension;diabetes mellitus;dyslipidemia     Objective:    There were no vitals filed for this visit. There is no height or weight on file to calculate BMI.  Advanced Directives 08/14/2020 01/05/2017 05/13/2016 03/15/2016  Does Patient Have a Medical Advance Directive? Yes Yes Yes No  Type of Paramedic of [place];Living will - [place];Living will -  Does patient want to make changes to medical advance directive? - - No - Patient declined -  Copy of [place] in Chart? No - copy requested - No - copy requested -  Would patient like information on creating a medical advance directive? - - - No - patient declined information    Current Medications (verified) Outpatient Encounter Medications as of 08/14/2020  Medication Sig  . ACCU-CHEK AVIVA PLUS test strip 1 EACH BY OTHER ROUTE 4 (FOUR) TIMES DAILY. USE AS  INSTRUCTED DIAG E11.65  . ACCU-CHEK SOFTCLIX LANCETS lancets 1 EACH BY OTHER ROUTE 3 (THREE) TIMES A DAY.  Marland Kitchen acetaminophen (TYLENOL) 325 MG tablet Take 2 tablets (650 mg total) by mouth every 4 (four) hours as needed for headache or mild pain.  Marland Kitchen amLODipine (NORVASC) 10 MG tablet Take 10 mg by mouth daily.  . chlorthalidone (HYGROTON) 25 MG tablet Take by mouth.  . clobetasol (TEMOVATE) 0.05 % external solution Apply topically.  . Continuous Blood Gluc Sensor (FREESTYLE LIBRE SENSOR SYSTEM) MISC 1 EACH BY DOES NOT APPLY ROUTE EVERY 10 DAYS.  . Dulaglutide (TRULICITY) 1.5 0000000 SOPN Inject 1.5 mg into the skin once a week.  . fenofibrate 160 MG tablet TAKE 1 TABLET BY MOUTH EVERY DAY FOR high triglycerides  . fluticasone (FLONASE) 50 MCG/ACT nasal spray Place 1-2 sprays into both nostrils at bedtime as needed for allergies or rhinitis.  . hydrALAZINE (APRESOLINE) 25 MG tablet Take 1 tablet (25 mg total) by mouth 3 (three) times daily as needed. For BP > 180/120  . Insulin Aspart (NOVOLOG FLEXPEN [place]) Inject 10 Units into the skin 3 (three) times daily before meals. If blood sugar is higher than 120 give 10 units before each meal  . insulin detemir (LEVEMIR) 100 UNIT/ML FlexPen Inject 50 Units into the skin at bedtime.   . Insulin Pen Needle (NOVOFINE PEN NEEDLE) 32G X 6 MM MISC USE WITH INSULIN INJECTIONS 3 TIMES DAILY  . Insulin Pen Needle 32G X 6 MM MISC SMARTSIG:Injection 3 Times Daily  . ketoconazole (NIZORAL) 2 % shampoo  SMARTSIG:Topical 2-3 Times Weekly  . metFORMIN (GLUCOPHAGE-XR) 500 MG 24 hr tablet Take 1 tablet (500 mg total) by mouth daily with breakfast. 1000 MG TAKE IN THE EVENING  . metoprolol tartrate (LOPRESSOR) 25 MG tablet TAKE 1 TABLET BY MOUTH EVERY DAY  . Multiple Vitamins-Minerals (PRESERVISION AREDS 2 PO) Take 1 tablet by mouth 2 (two) times daily.   Marland Kitchen neomycin-polymyxin b-dexamethasone (MAXITROL) 3.5-10000-0.1 OINT Place 1 application into the left eye 2 (two) times daily.   . ramipril (ALTACE) 10 MG capsule TAKE 2 CAPSULES BY MOUTH DAILY  . rivaroxaban (XARELTO) 20 MG TABS tablet TAKE 1 TABLET BY MOUTH DAILY WITH SUPPER. LABS NEEDED FOR FURTHER REFILLS  . rosuvastatin (CRESTOR) 10 MG tablet TAKE 1 TABLET BY MOUTH EVERY DAY  . tobramycin (TOBREX) 0.3 % ophthalmic solution Place into the left eye.  . triamcinolone (KENALOG) 0.1 % Apply 1 application topically 2 (two) times daily as needed.  Marland Kitchen VASCEPA 1 g capsule TAKE 2 CAPSULES BY MOUTH 2 TIMES DAILY  . cholecalciferol (VITAMIN D) 1000 UNITS tablet Take 2,000 Units by mouth daily.   Marland Kitchen omeprazole (PRILOSEC) 20 MG capsule TAKE 1 CAPSULE BY MOUTH EVERY DAY  . [DISCONTINUED] diltiazem (DILT-XR) 240 MG 24 hr capsule TAKE 1 CAPSULE BY MOUTH EVERY DAY. Please keep upcoming appt in November with Dr. Rayann Heman for future refills. Thank you (Patient not taking: Reported on 08/14/2020)  . [DISCONTINUED] hydrochlorothiazide (HYDRODIURIL) 12.5 MG tablet Take 1 tablet (12.5 mg total) by mouth daily. (Patient not taking: Reported on 08/14/2020)  . [DISCONTINUED] neomycin-polymyxin-dexameth (MAXITROL) 0.1 % OINT Place 1 application into the left eye 2 (two) times daily.  . [DISCONTINUED] tobramycin (TOBREX) 0.3 % ophthalmic solution Place into the left eye.   No facility-administered encounter medications on file as of 08/14/2020.    Allergies (verified) Fluorescein, Premarin  [conjugated estrogens], Demerol [meperidine], Exenatide, and Liraglutide   History: Past Medical History:  Diagnosis Date  . Colon polyps 2016  . 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 Diabetes mellitus type 2, controlled ([place]) 2012  . GERD (gastroesophageal reflux disease)   . History of hiatal hernia   . Hypertension 2000   2009: Renal Dopplers showed R RA ostial stenosis -- (told not a problem or 20 yrs)  . Hypertriglyceridemia 1996   Very labile and difficult control: Began in '96 when she began taking Premarin  after hysterectomy -- triglycerides increased to 3582 neuropathy in her feet> care for by endocrinologis; levels vary based on stress.;; Labs from March 2015: TC 150, TG 745, HDL 26, LDL 45  . Iron deficiency anemia 04/30/2019  . Kidney stones   . LBBB (left bundle branch block) 2009   Initially diagnosed as rate related during stress echo.  . Macular degeneration    "dry in right; treating like wet in the left" (03/15/2016)  . Microcytic anemia 04/26/2019  . Paroxysmal SVT (supraventricular tachycardia) ([place]) 2009   less frequent following I&D of chest wall abscess (Jan 015), not previously documented, may have been afib.  . Personal history of atrial fibrillation 01/04/2016   a. s/p ablation in 04/2016  . Sarcoma ([place]) 2015   dermatofibro sarcoma protuberens left groin  . 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 ->  No evidence of acute cerebral infarct.  Brain MRI shows abnormal appearance of distal left vertebral  artery flow.  . Vertebral artery occlusion, left    Noted on MRI/A of the neck in April 2018 --left vertebral artery occluded at origin and reconstitutes at level of V3.  This is similar to what was found on carotid Dopplers. -->  ASYMPTOMATIC   Past Surgical History:  Procedure Laterality Date  . Abdominal Doppler ultrasound  February 2009    Right ostial renal artery stenosis; normal renal structure.  . APPENDECTOMY  1968  . CARDIAC ELECTROPHYSIOLOGY [place] AND ABLATION  2017  . CYSTOSCOPY W/ URETEROSCOPY W/ LITHOTRIPSY   1999   Large calcium oxalate stone  . CYSTOSCOPY/RETROGRADE/URETEROSCOPY/STONE EXTRACTION WITH BASKET  1972  . ELECTROPHYSIOLOGIC STUDY N/A 05/13/2016   Procedure: Atrial Fibrillation Ablation;  Surgeon: Thompson Grayer, MD;  Location: [facility];  Service: Cardiovascular;  Laterality: N/A;  . EXCISIONAL HEMORRHOIDECTOMY    1981  . FRACTURE SURGERY    . IRRIGATION AND DEBRIDEMENT SEBACEOUS CYST  January 2015    chest wall; with antibiotics --> following this treatment, SVT episodes became much less frequent.  Marland Kitchen NM MYOVIEW LTD  12/2015   LOW RISK.  No ischemia or infarction.  Small septal-anterior defect likely related to left bundle branch block.  . ORIF PROXIMAL TIBIAL PLATEAU FRACTURE Left 2007   , Related shattered left tibial plateau with reattachment of torn ligaments and insertion of titanium plate and screws.  Marland Kitchen RENAL ARTERY DUPLEX  01/02/2014   R&L RA - mildly elevated velocities - < 60%; Bilateral Kidneys - normal shape & size.  Marland Kitchen SARCOMA EXCISION Left 2015   dermatofibro sarcoma protuberens, wide excision of groin from front side to buttocks"  . SOFT TISSUE BIOPSY Left 2015  . TOTAL ABDOMINAL HYSTERECTOMY  1994  . TRANSTHORACIC ECHOCARDIOGRAM  12/2015   Normal LV size and function.  EF 55%.  Mild LVH.  Septal-lateral dyssynchrony (from LBBB).  No significant valvular abnormalities.  Marland Kitchen TREADMILL STRESS ECHO  01/2009   No regional wall motion abnormalities with stress == non-ischemic; rate related incomplete left bundle branch block   Family History  Problem Relation Age of Onset  . Heart disease Brother        Died at 31  . Heart disease Mother        Died at 15  . Heart disease Father        Died at 38  . Breast cancer Sister        Died at 9   Social History   Socioeconomic History  . Marital status: Widowed    Spouse name: Not on file  . Number of children: Not on file  . Years of education: Not on file  . Highest education level: Not on file  Occupational History  . Occupation: retired  Tobacco Use  . Smoking status: Former Smoker    Packs/day: 0.50    Years: 15.00    Pack years: 7.50    Types: Cigarettes  . Smokeless tobacco: Never Used  . 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 Vaping Use: Never used   Substance and Sexual Activity  . Alcohol use: No  . Drug use: No  . Sexual activity: Not Currently  Other Topics Concern  . 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 She is now widowed for just under 2 years. Her husband died of cancer. They do  not have any children.   She recently (spring of 2015) moved to [place] to be closer to her brother, [name] and his family.   She been the caregiver for her ailing husband for 5 years. He passed away in 03/17/12, and she has had prolonged period of grieving partly due to her having lost 3 members of her immediate family during that time period as well.  --- She is under a lot of stress      She is a former smoker quit years ago. She does not drink alcohol. She is active, but not routine exercise. She says that she tries to eat healthy and knows what usually affects her triglyceride levels.   Social Determinants of Health   Financial Resource Strain: Low Risk   . Difficulty of Paying Living Expenses: Not hard at all  Food Insecurity: No Food Insecurity  . Worried About Charity fundraiser in the Last Year: Never true  . Ran Out of Food in the Last Year: Never true  Transportation Needs: No Transportation Needs  . Lack of Transportation (Medical): No  . Lack of Transportation (Non-Medical): No  Physical Activity: Sufficiently Active  . Days of Exercise per Week: 7 days  . Minutes of Exercise per Session: 50 min  Stress: No Stress Concern Present  . Feeling of Stress : Not at all  Social Connections: Moderately Isolated  . Frequency of Communication with Friends and Family: More than three times a week  . Frequency of Social Gatherings with Friends and Family: More than three times a week  . Attends Religious Services: More than 4 times per year  . Active Member of Clubs or Organizations: No  . Attends Archivist Meetings: Never  . 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 bring in CBG monitor from home?: No  How often do you need to have someone help you when you read instructions, pamphlets, or other written materials from your doctor or pharmacy?: 1 - Never  Diabetic?Nutrition Risk Assessment:  Has the patient had any N/V/D within the last 2 months?  No  Does the patient have any non-healing wounds?  No  Has the patient had any unintentional weight loss or weight gain?  No   Diabetes:  Is the patient diabetic?  Yes  If diabetic, was a CBG obtained today?  Yes  Did the patient bring in their glucometer from home?  No  How often do you monitor your CBG's? Daily  Financial Strains and Diabetes Management:  Are you having any financial strains with the device, your supplies or your medication? No .  Does the patient want to be seen by Chronic Care Management for management of their diabetes?  No  Would the patient like to be referred to a Nutritionist or for Diabetic Management?  No   Diabetic Exams:  Diabetic Eye Exam: Completed 01/29/2020 DIABETIC FOOT EXAM: Completed 08/10/20 by Endocrinologist   Interpreter Needed?: No  Information entered by :: Charlott Rakes, LPN   Activities of Daily Living In your present state of health, do you have any  difficulty performing the following activities: 08/14/2020  Hearing? N  Vision? N  Difficulty concentrating or making decisions? N  Walking or climbing stairs? N  Dressing or bathing? N  Doing errands, shopping? N  Preparing Food and eating ? N  Using the Toilet? N  Managing your Medications?  N  Managing your Finances? N  Housekeeping or managing your Housekeeping? N  Some recent data might be hidden    Patient Care Team: Vivi Barrack, MD as PCP - General (Family Medicine) Leonie Man, MD as PCP - Cardiology (Cardiology) Akdamar, Murvin Donning, MD as Consulting Physician (Specialist) Verdell Carmine, MD as Consulting Physician (Internal Medicine)  Indicate any recent Medical Services you may have received from other than [facility] providers in the past year (date may be approximate).     Assessment:   This is a routine wellness examination for Isabella Wright.  Hearing/Vision screen  Hearing Screening   125Hz  250Hz  500Hz  1000Hz  2000Hz  3000Hz  4000Hz  6000Hz  8000Hz   Right ear:           Left ear:           Comments: Pt denies any hearing issues  Vision Screening Comments: Pt follows up with dr sanders for annual eye exams  Dietary issues and exercise activities discussed: Current Exercise Habits: Home exercise routine, Type of exercise: walking, Time (Minutes): 50, Frequency (Times/Week): 7, Weekly Exercise (Minutes/Week): 350  Goals    . LDL CALC < 100     LDL-P number < 1000 given diabetes and metabolic syndrome    . Patient Stated     Continue staying active      Depression Screen PHQ 2/9 Scores 08/14/2020 04/22/2019 08/28/2017  PHQ - 2 Score 0 0 0  PHQ- 9 Score - 0 -    Fall Risk Fall Risk  08/14/2020 04/22/2019 08/28/2017  Falls in the past year? 0 0 No  Number falls in past yr: 0 - -  Injury with Fall? 0 - -  Risk for fall due to : Impaired vision - -  Follow up Falls prevention discussed - -    FALL RISK PREVENTION PERTAINING TO THE HOME:  Any stairs in or around the home? Yes  If so, are there any without handrails? No  Home free of loose throw rugs in walkways, pet beds, electrical cords, etc? Yes  Adequate lighting in your home to reduce risk of falls? Yes   ASSISTIVE DEVICES UTILIZED TO PREVENT FALLS:  Life alert? Yes  Use of a cane, walker or w/c? No   Grab bars in the bathroom? Yes  Shower chair or bench in shower? Yes  Elevated toilet seat or a handicapped toilet? Yes   TIMED UP AND GO:  Was the test performed? No .      Cognitive Function:     6CIT Screen 08/14/2020  What Year? 0 points  What month? 0 points  Count back from 20 0 points  Months in reverse 0 points  Repeat phrase 0 points    Immunizations Immunization History  Administered Date(s) Administered  . Fluad Quad(high Dose 65+) 04/08/2020  . Influenza, High Dose Seasonal PF 05/01/2016, 05/22/2017  . Influenza,inj,Quad PF,6+ Mos 04/13/2018  . Influenza,inj,quad, With Preservative 04/16/2019  . Influenza,trivalent, recombinat, inj, PF 06/05/2015  . Influenza-Unspecified 04/13/2018  . PFIZER(Purple Top)SARS-COV-2 Vaccination 08/08/2019, 08/29/2019, 04/27/2020  . Pneumococcal Conjugate-13 08/17/2014  . Pneumococcal Polysaccharide-23 08/18/2015  . Pneumococcal-Unspecified 07/19/2015  . Td 07/28/2005    TDAP status: Due, Education has been provided  regarding the importance of this vaccine. Advised may receive this vaccine at local pharmacy or Health Dept. Aware to provide a copy of the vaccination record if obtained from local pharmacy or Health Dept. Verbalized acceptance and understanding.  Flu Vaccine status: Up to date done 04/08/20  Pneumococcal vaccine status: Up to date  Covid-19 vaccine status: Completed vaccines  Qualifies for Shingles Vaccine? Yes   Zostavax completed No   Shingrix Completed?: No.    Education has been provided regarding the importance of this vaccine. Patient has been advised to call insurance company to determine out of pocket expense if they have not yet received this vaccine. Advised may also receive vaccine at local pharmacy or Health Dept. Verbalized acceptance and understanding.  Screening Tests Health Maintenance  Topic Date Due  . Hepatitis C Screening  Never done  . HEMOGLOBIN A1C  10/12/2018  . DEXA SCAN  02/11/2021  (Originally 11/13/2011)  . TETANUS/TDAP  08/14/2021 (Originally 07/29/2015)  . COVID-19 Vaccine (4 - Booster for Pfizer series) 10/26/2020  . OPHTHALMOLOGY EXAM  01/28/2021  . FOOT EXAM  08/10/2021  . MAMMOGRAM  03/02/2022  . COLONOSCOPY (Pts 45-51yrs Insurance coverage will need to be confirmed)  02/24/2025  . INFLUENZA VACCINE  Completed  . PNA vac Low Risk Adult  Completed    Health Maintenance  Health Maintenance Due  Topic Date Due  . Hepatitis C Screening  Never done  . HEMOGLOBIN A1C  10/12/2018    Colorectal cancer screening: Type of screening: Colonoscopy. Completed 02/25/15. Repeat every 10 years  Mammogram status: Completed 03/02/20. Repeat every year  Bone Density: Declined   Additional Screening:  Hepatitis C Screening: does qualify;  Vision Screening: Recommended annual ophthalmology exams for early detection of glaucoma and other disorders of the eye. Is the patient up to date with their annual eye exam?  Yes  Who is the provider or what is the name of the office in which the patient attends annual eye exams? Dr. Baird [facility]    Dental Screening: Recommended annual dental exams for proper oral hygiene  Community Resource Referral / Chronic Care Management: CRR required this visit?  No   CCM required this visit?  No      Plan:     I have personally reviewed and noted the following in the patient's chart:   . Medical and social history . Use of alcohol, tobacco or illicit drugs  . Current medications and supplements . Functional ability and status . Nutritional status . Physical activity . Advanced directives . List of other physicians . Hospitalizations, surgeries, and ER visits in previous 12 months . Vitals . Screenings to include cognitive, depression, and falls . Referrals and appointments  In addition, I have reviewed and discussed with patient certain preventive protocols, quality metrics, and best practice recommendations. A written  personalized care plan for preventive services as well as general preventive health recommendations were provided to patient.     Willette Brace, LPN   [id number]   Nurse Notes: None

## 2020-08-14 NOTE — Patient Instructions (Addendum)
[name] , Thank you for taking time to come for your Medicare Wellness Visit. I appreciate your ongoing commitment to your health goals. Please review the following plan we discussed and let me know if I can assist you in the future.   Screening recommendations/referrals: Colonoscopy: Done 02/25/15 Mammogram: Done 03/02/20 Bone Density:Declined and discussed Recommended yearly ophthalmology/optometry visit for glaucoma screening and checkup Recommended yearly dental visit for hygiene and checkup  Vaccinations: Influenza vaccine: Done 04/08/20 Up to date Pneumococcal vaccine: Up to date Tdap vaccine: Due and discussed Shingles vaccine: Shingrix discussed. Please contact your pharmacy for coverage information.    Covid-19:Completed 1/21, 2/11, & 10/11/211  Advanced directives: Please bring a copy of your health care power of attorney and living will to the office at your convenience.  Conditions/risks identified: Continue staying active   Next appointment: Follow up in one year for your annual wellness visit    Preventive Care 74 Years and Older, Female Preventive care refers to lifestyle choices and visits with your health care provider that can promote health and wellness. What does preventive care include?  A yearly physical exam. This is also called an annual well check.  Dental exams once or twice a year.  Routine eye exams. Ask your health care provider how often you should have your eyes checked.  Personal lifestyle choices, including:  Daily care of your teeth and gums.  Regular physical activity.  Eating a healthy diet.  Avoiding tobacco and drug use.  Limiting alcohol use.  Practicing safe sex.  Taking low-dose aspirin every day.  Taking vitamin and mineral supplements as recommended by your health care provider. What happens during an annual well check? The services and screenings done by your health care provider during your annual well check will depend on  your age, overall health, lifestyle risk factors, and family history of disease. Counseling  Your health care provider may ask you questions about your:  Alcohol use.  Tobacco use.  Drug use.  Emotional well-being.  Home and relationship well-being.  Sexual activity.  Eating habits.  History of falls.  Memory and ability to understand (cognition).  Work and work Statistician.  Reproductive health. Screening  You may have the following tests or measurements:  Height, weight, and BMI.  Blood pressure.  Lipid and cholesterol levels. These may be checked every 5 years, or more frequently if you are over 62 years old.  Skin check.  Lung cancer screening. You may have this screening every year starting at age 74 if you have a 30-pack-year history of smoking and currently smoke or have quit within the past 15 years.  Fecal occult blood test (FOBT) of the stool. You may have this test every year starting at age 74.  Flexible sigmoidoscopy or colonoscopy. You may have a sigmoidoscopy every 5 years or a colonoscopy every 10 years starting at age 74.  Hepatitis C blood test.  Hepatitis B blood test.  Sexually transmitted disease (STD) testing.  Diabetes screening. This is done by checking your blood sugar (glucose) after you have not eaten for a while (fasting). You may have this done every 1-3 years.  Bone density scan. This is done to screen for osteoporosis. You may have this done starting at age 74.  Mammogram. This may be done every 1-2 years. Talk to your health care provider about how often you should have regular mammograms. Talk with your health care provider about your test results, treatment options, and if necessary, the need for  more tests. Vaccines  Your health care provider may recommend certain vaccines, such as:  Influenza vaccine. This is recommended every year.  Tetanus, diphtheria, and acellular pertussis (Tdap, Td) vaccine. You may need a Td booster  every 10 years.  Zoster vaccine. You may need this after age 74.  Pneumococcal 13-valent conjugate (PCV13) vaccine. One dose is recommended after age 74.  Pneumococcal polysaccharide (PPSV23) vaccine. One dose is recommended after age 74. Talk to your health care provider about which screenings and vaccines you need and how often you need them. This information is not intended to replace advice given to you by your health care provider. Make sure you discuss any questions you have with your health care provider. Document Released: 07/31/2015 Document Revised: 03/23/2016 Document Reviewed: 05/05/2015 Elsevier Interactive Patient Education  2017 [employer] Prevention in the Home Falls can cause injuries. They can happen to people of all ages. There are many things you can do to make your home safe and to help prevent falls. What can I do on the outside of my home?  Regularly fix the edges of walkways and driveways and fix any cracks.  Remove anything that might make you trip as you walk through a door, such as a raised step or threshold.  Trim any bushes or trees on the path to your home.  Use bright outdoor lighting.  Clear any walking paths of anything that might make someone trip, such as rocks or tools.  Regularly check to see if handrails are loose or broken. Make sure that both sides of any steps have handrails.  Any raised decks and porches should have guardrails on the edges.  Have any leaves, snow, or ice cleared regularly.  Use sand or salt on walking paths during winter.  Clean up any spills in your garage right away. This includes oil or grease spills. What can I do in the bathroom?  Use night lights.  Install grab bars by the toilet and in the tub and shower. Do not use towel bars as grab bars.  Use non-skid mats or decals in the tub or shower.  If you need to sit down in the shower, use a plastic, non-slip stool.  Keep the floor dry. Clean up any  water that spills on the floor as soon as it happens.  Remove soap buildup in the tub or shower regularly.  Attach bath mats securely with double-sided non-slip rug tape.  Do not have throw rugs and other things on the floor that can make you trip. What can I do in the bedroom?  Use night lights.  Make sure that you have a light by your bed that is easy to reach.  Do not use any sheets or blankets that are too big for your bed. They should not hang down onto the floor.  Have a firm chair that has side arms. You can use this for support while you get dressed.  Do not have throw rugs and other things on the floor that can make you trip. What can I do in the kitchen?  Clean up any spills right away.  Avoid walking on wet floors.  Keep items that you use a lot in easy-to-reach places.  If you need to reach something above you, use a strong step stool that has a grab bar.  Keep electrical cords out of the way.  Do not use floor polish or wax that makes floors slippery. If you must use wax, use non-skid  floor wax.  Do not have throw rugs and other things on the floor that can make you trip. What can I do with my stairs?  Do not leave any items on the stairs.  Make sure that there are handrails on both sides of the stairs and use them. Fix handrails that are broken or loose. Make sure that handrails are as long as the stairways.  Check any carpeting to make sure that it is firmly attached to the stairs. Fix any carpet that is loose or worn.  Avoid having throw rugs at the top or bottom of the stairs. If you do have throw rugs, attach them to the floor with carpet tape.  Make sure that you have a light switch at the top of the stairs and the bottom of the stairs. If you do not have them, ask someone to add them for you. What else can I do to help prevent falls?  Wear shoes that:  Do not have high heels.  Have rubber bottoms.  Are comfortable and fit you well.  Are closed  at the toe. Do not wear sandals.  If you use a stepladder:  Make sure that it is fully opened. Do not climb a closed stepladder.  Make sure that both sides of the stepladder are locked into place.  Ask someone to hold it for you, if possible.  Clearly mark and make sure that you can see:  Any grab bars or handrails.  First and last steps.  Where the edge of each step is.  Use tools that help you move around (mobility aids) if they are needed. These include:  Canes.  Walkers.  Scooters.  Crutches.  Turn on the lights when you go into a dark area. Replace any light bulbs as soon as they burn out.  Set up your furniture so you have a clear path. Avoid moving your furniture around.  If any of your floors are uneven, fix them.  If there are any pets around you, be aware of where they are.  Review your medicines with your doctor. Some medicines can make you feel dizzy. This can increase your chance of falling. Ask your doctor what other things that you can do to help prevent falls. This information is not intended to replace advice given to you by your health care provider. Make sure you discuss any questions you have with your health care provider. Document Released: 04/30/2009 Document Revised: 12/10/2015 Document Reviewed: 08/08/2014 Elsevier Interactive Patient Education  2017 [employer].

## 2020-09-10 DIAGNOSIS — I447 Left bundle-branch block, unspecified: Secondary | ICD-10-CM | POA: Diagnosis not present

## 2020-09-10 DIAGNOSIS — I10 Essential (primary) hypertension: Secondary | ICD-10-CM | POA: Diagnosis not present

## 2020-09-10 DIAGNOSIS — Z8679 Personal history of other diseases of the circulatory system: Secondary | ICD-10-CM | POA: Diagnosis not present

## 2020-09-10 DIAGNOSIS — R011 Cardiac murmur, unspecified: Secondary | ICD-10-CM | POA: Diagnosis not present

## 2020-09-22 ENCOUNTER — Other Ambulatory Visit: Payer: Self-pay | Admitting: Cardiology

## 2020-09-28 DIAGNOSIS — I348 Other nonrheumatic mitral valve disorders: Secondary | ICD-10-CM | POA: Diagnosis not present

## 2020-09-28 DIAGNOSIS — I517 Cardiomegaly: Secondary | ICD-10-CM | POA: Diagnosis not present

## 2020-10-01 DIAGNOSIS — D696 Thrombocytopenia, unspecified: Secondary | ICD-10-CM | POA: Diagnosis not present

## 2020-10-20 ENCOUNTER — Telehealth: Payer: Self-pay

## 2020-10-20 MED ORDER — HYDROCORTISONE ACETATE 25 MG RE SUPP: 25.0000 mg | Freq: Two times a day (BID) | RECTAL | 0 refills | Status: AC

## 2020-10-20 NOTE — Telephone Encounter (Signed)
Patient called in stating that her old PCP would prescribe her medication to help with her hemorrhoids and is requesting Dr.Parker send in the same ones, I explained to patient that I would have to get her scheduled for an appointment so someone is able to send a prescription in. 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

## 2020-10-20 NOTE — Addendum Note (Signed)
Addended by: Vivi Barrack on: 10/20/2020 12:48 PM   Modules accepted: Orders

## 2020-10-20 NOTE — Telephone Encounter (Signed)
Patient states that she got a hemorrhoid prescription from Dr.Evans in [place] before switching over to Dr.Parker, "Hydrocortisone AC 25mg " suppositories for hemorrhoids. 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 Says "she just wants her medication refilled" is in pain.

## 2020-10-20 NOTE — Telephone Encounter (Signed)
Called and spoke with patient about medication being sent in, she gave a verbal understanding.

## 2020-10-20 NOTE — Telephone Encounter (Signed)
Please advise 

## 2020-10-20 NOTE — Telephone Encounter (Signed)
Can we clarify what medication she was on? I don't see anything in our chart.  Isabella Wright. Jerline Pain, MD 10/20/2020 11:28 AM

## 2020-10-21 ENCOUNTER — Other Ambulatory Visit: Payer: Self-pay | Admitting: Internal Medicine

## 2020-10-21 NOTE — Telephone Encounter (Signed)
Prescription refill request for Xarelto received.   Indication:Afib  Last office visit: 06/15/2020, [facility] Weight: 65.7 kg Age: 75 yo  Scr: 0.78, 08/10/2020 CrCl: 12ml/min   Pt is on the correct dose of Xarelto per dosing criteria, prescription refill sent for Xarelto 20mg  daily.

## 2020-11-04 ENCOUNTER — Other Ambulatory Visit: Payer: Self-pay | Admitting: Internal Medicine

## 2020-11-04 ENCOUNTER — Other Ambulatory Visit: Payer: Self-pay | Admitting: Family Medicine

## 2020-11-09 DIAGNOSIS — E1169 Type 2 diabetes mellitus with other specified complication: Secondary | ICD-10-CM | POA: Diagnosis not present

## 2020-11-09 DIAGNOSIS — K529 Noninfective gastroenteritis and colitis, unspecified: Secondary | ICD-10-CM | POA: Diagnosis not present

## 2020-11-09 DIAGNOSIS — E785 Hyperlipidemia, unspecified: Secondary | ICD-10-CM | POA: Diagnosis not present

## 2020-11-10 DIAGNOSIS — H43813 Vitreous degeneration, bilateral: Secondary | ICD-10-CM | POA: Diagnosis not present

## 2020-11-10 DIAGNOSIS — H353221 Exudative age-related macular degeneration, left eye, with active choroidal neovascularization: Secondary | ICD-10-CM | POA: Diagnosis not present

## 2020-11-10 DIAGNOSIS — H2513 Age-related nuclear cataract, bilateral: Secondary | ICD-10-CM | POA: Diagnosis not present

## 2020-11-10 DIAGNOSIS — H353114 Nonexudative age-related macular degeneration, right eye, advanced atrophic with subfoveal involvement: Secondary | ICD-10-CM | POA: Diagnosis not present

## 2021-01-05 DIAGNOSIS — H353114 Nonexudative age-related macular degeneration, right eye, advanced atrophic with subfoveal involvement: Secondary | ICD-10-CM | POA: Diagnosis not present

## 2021-01-05 DIAGNOSIS — H353221 Exudative age-related macular degeneration, left eye, with active choroidal neovascularization: Secondary | ICD-10-CM | POA: Diagnosis not present

## 2021-01-05 DIAGNOSIS — H43813 Vitreous degeneration, bilateral: Secondary | ICD-10-CM | POA: Diagnosis not present

## 2021-02-02 ENCOUNTER — Other Ambulatory Visit: Payer: Self-pay | Admitting: Family Medicine

## 2021-02-09 DIAGNOSIS — E785 Hyperlipidemia, unspecified: Secondary | ICD-10-CM | POA: Diagnosis not present

## 2021-02-09 DIAGNOSIS — E1169 Type 2 diabetes mellitus with other specified complication: Secondary | ICD-10-CM | POA: Diagnosis not present

## 2021-02-15 ENCOUNTER — Other Ambulatory Visit: Payer: Self-pay | Admitting: Family Medicine

## 2021-02-16 ENCOUNTER — Encounter: Payer: Self-pay | Admitting: Family Medicine

## 2021-02-16 ENCOUNTER — Other Ambulatory Visit: Payer: Self-pay

## 2021-02-16 ENCOUNTER — Ambulatory Visit (INDEPENDENT_AMBULATORY_CARE_PROVIDER_SITE_OTHER): Payer: Medicare Other | Admitting: Family Medicine

## 2021-02-16 DIAGNOSIS — M65341 Trigger finger, right ring finger: Secondary | ICD-10-CM | POA: Diagnosis not present

## 2021-02-16 NOTE — Progress Notes (Signed)
Office Visit Note   Patient: Isabella Wright           Date of Birth: 1946/02/11           MRN: [id number] Visit Date: 02/16/2021 Requested by: Vivi Barrack, MD [address] [place],  [place] [address] PCP: Vivi Barrack, MD  Subjective: Chief Complaint  Patient presents with   Right Ring Finger - Pain    Trigger finger x 1 month. Started after turning a stiff outside faucet. Had trigger thumb 2 years ago -- cortisone injection helped her before. Right hand dominant.     HPI: She is here with right ring finger triggering.  About a month ago symptoms began after trying to turn a water faucet that was shot too tightly.  She has a history of right trigger thumb which resolved after injection.  She is wondering if an injection would help at this time.              ROS:   All other systems were reviewed and are negative.  Objective: Vital Signs: There were no vitals taken for this visit.  Physical Exam:  General:  Alert and oriented, in no acute distress. Pulm:  Breathing unlabored. Psy:  Normal mood, congruent affect. Skin: No erythema Right hand: She has a tender nodule at the ring finger A1 pulley with triggering in flexion.  Imaging: No results found.  Assessment & Plan: Right fourth trigger finger -Injection today.  Follow-up as needed.     Procedures: Right hand injection: After sterile prep with Betadine, injected 1 cc 1% lidocaine without epinephrine and 40 mg Depo-Medrol into the region of the fourth finger A1 pulley.       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   Persistent atrial fibrillation ([place]) - s/p Ablation, miantaining NSR, CHA2DS2VASc 4  (on Xarelto) 03/15/2016   Essential hypertension 03/19/2014   Encounter for long-term (current) use of other medications 12/03/2013   Renal artery stenosis ([facility]) 12/03/2013   Mixed hyperlipidemia: High triglycerides, low HDL 12/03/2013   LBBB (left bundle branch block) 12/03/2013   SVT (supraventricular tachycardia) ([place]) 12/03/2013   Diabetes mellitus type 2, controlled ([employer]) 07/18/2010   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   Diabetes mellitus type 2, controlled ([place]) 2012   GERD (gastroesophageal reflux disease)    History of hiatal hernia    Hypertension 2000   2009: Renal Dopplers showed R RA ostial stenosis -- (told not a problem or 20 yrs)   Hypertriglyceridemia 1996   Very labile and difficult control: Began in '96 when she began taking Premarin after hysterectomy -- triglycerides increased to 3582 neuropathy in her feet> care for by endocrinologis; levels vary based on stress.;; Labs from March 2015: TC 150, TG 745, HDL 26, LDL 45   Iron deficiency anemia 04/30/2019   Kidney stones    LBBB (left bundle branch block) 2009   Initially diagnosed as rate related during stress echo.   Macular degeneration    "dry in right; treating like wet in the left" (03/15/2016)   Microcytic anemia 04/26/2019   Paroxysmal SVT (  supraventricular tachycardia) ([facility]) 2009   less frequent following I&D of chest wall abscess (Jan 015), not previously documented, may have been afib.   Personal history of atrial fibrillation 01/04/2016   a. 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 ->  No evidence of acute cerebral infarct.  Brain MRI shows abnormal appearance of distal left vertebral artery  flow.   Vertebral artery occlusion, left    Noted on MRI/A of the neck in April 2018 --left vertebral artery occluded at origin and reconstitutes at level of V3.  This is similar to what was found on carotid Dopplers. -->  ASYMPTOMATIC    Family History  Problem Relation Age of Onset   Heart disease Brother        Died at 9   Heart disease Mother        Died at 70   Heart disease Father        Died at 12   Breast cancer Sister        Died at 36    Past Surgical History:  Procedure Laterality Date   Abdominal Doppler ultrasound  February 2009    Right ostial renal artery stenosis; normal renal structure.   APPENDECTOMY  1968   CARDIAC ELECTROPHYSIOLOGY MAPPING AND ABLATION  2017   CYSTOSCOPY W/ URETEROSCOPY W/ LITHOTRIPSY   1999   Large calcium oxalate stone   CYSTOSCOPY/RETROGRADE/URETEROSCOPY/STONE EXTRACTION WITH BASKET  1972   ELECTROPHYSIOLOGIC STUDY N/A 05/13/2016   Procedure: Atrial Fibrillation Ablation;  Surgeon: Thompson Grayer, MD;  Location: [facility];  Service: Cardiovascular;  Laterality: N/A;   EXCISIONAL HEMORRHOIDECTOMY   1981   FRACTURE SURGERY     IRRIGATION AND DEBRIDEMENT SEBACEOUS CYST  January 2015    chest wall; with antibiotics --> following this treatment, SVT episodes became much less frequent.   NM MYOVIEW LTD  12/2015   LOW RISK.  No ischemia or infarction.  Small septal-anterior defect likely related to left bundle branch block.   ORIF PROXIMAL TIBIAL PLATEAU FRACTURE Left 2007   , Related shattered left tibial plateau with reattachment of torn ligaments and insertion of titanium plate and screws.   RENAL ARTERY DUPLEX  01/02/2014   R&L RA - mildly elevated velocities - < 60%; Bilateral Kidneys - normal shape & size.   SARCOMA EXCISION Left 2015   dermatofibro sarcoma protuberens, wide excision of groin from front side to buttocks"   SOFT TISSUE BIOPSY Left 2015   TOTAL ABDOMINAL HYSTERECTOMY  1994   TRANSTHORACIC ECHOCARDIOGRAM  12/2015    Normal LV size and function.  EF 55%.  Mild LVH.  Septal-lateral dyssynchrony (from LBBB).  No significant valvular abnormalities.   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

## 2021-03-02 ENCOUNTER — Other Ambulatory Visit: Payer: Self-pay | Admitting: Cardiology

## 2021-03-02 DIAGNOSIS — H353114 Nonexudative age-related macular degeneration, right eye, advanced atrophic with subfoveal involvement: Secondary | ICD-10-CM | POA: Diagnosis not present

## 2021-03-02 DIAGNOSIS — H43813 Vitreous degeneration, bilateral: Secondary | ICD-10-CM | POA: Diagnosis not present

## 2021-03-02 DIAGNOSIS — H2513 Age-related nuclear cataract, bilateral: Secondary | ICD-10-CM | POA: Diagnosis not present

## 2021-03-02 DIAGNOSIS — H353221 Exudative age-related macular degeneration, left eye, with active choroidal neovascularization: Secondary | ICD-10-CM | POA: Diagnosis not present

## 2021-03-08 DIAGNOSIS — Z803 Family history of malignant neoplasm of breast: Secondary | ICD-10-CM | POA: Diagnosis not present

## 2021-03-08 DIAGNOSIS — Z1231 Encounter for screening mammogram for malignant neoplasm of breast: Secondary | ICD-10-CM | POA: Diagnosis not present

## 2021-03-08 LAB — HM MAMMOGRAPHY

## 2021-03-10 ENCOUNTER — Encounter: Payer: Self-pay | Admitting: Family Medicine

## 2021-03-18 DIAGNOSIS — Z794 Long term (current) use of insulin: Secondary | ICD-10-CM | POA: Diagnosis not present

## 2021-03-18 DIAGNOSIS — D696 Thrombocytopenia, unspecified: Secondary | ICD-10-CM | POA: Diagnosis not present

## 2021-03-18 DIAGNOSIS — C44799 Other specified malignant neoplasm of skin of left lower limb, including hip: Secondary | ICD-10-CM | POA: Diagnosis not present

## 2021-03-18 DIAGNOSIS — I10 Essential (primary) hypertension: Secondary | ICD-10-CM | POA: Diagnosis not present

## 2021-03-18 DIAGNOSIS — E119 Type 2 diabetes mellitus without complications: Secondary | ICD-10-CM | POA: Diagnosis not present

## 2021-03-18 DIAGNOSIS — D500 Iron deficiency anemia secondary to blood loss (chronic): Secondary | ICD-10-CM | POA: Diagnosis not present

## 2021-03-19 ENCOUNTER — Telehealth: Payer: Self-pay

## 2021-03-19 DIAGNOSIS — N6489 Other specified disorders of breast: Secondary | ICD-10-CM | POA: Diagnosis not present

## 2021-03-19 DIAGNOSIS — R922 Inconclusive mammogram: Secondary | ICD-10-CM | POA: Diagnosis not present

## 2021-03-19 DIAGNOSIS — R928 Other abnormal and inconclusive findings on diagnostic imaging of breast: Secondary | ICD-10-CM | POA: Diagnosis not present

## 2021-03-19 LAB — HM MAMMOGRAPHY

## 2021-03-19 NOTE — Telephone Encounter (Signed)
error 

## 2021-03-23 ENCOUNTER — Encounter: Payer: Self-pay | Admitting: Family Medicine

## 2021-03-31 ENCOUNTER — Other Ambulatory Visit: Payer: Self-pay | Admitting: Cardiology

## 2021-04-19 ENCOUNTER — Other Ambulatory Visit: Payer: Self-pay | Admitting: Internal Medicine

## 2021-04-20 NOTE — Telephone Encounter (Signed)
Pt last saw Dr Rayann Heman 06/15/20, last labs 08/10/20 Creat 0.78 at [facility] per care everywhere, age 75, weight 65.7kg, CrCl 65.63, based on specified criteria pt is on appropriate dosage of Xarelto 20mg  QD.  Will refill rx.

## 2021-04-30 DIAGNOSIS — Z23 Encounter for immunization: Secondary | ICD-10-CM | POA: Diagnosis not present

## 2021-04-30 DIAGNOSIS — Z794 Long term (current) use of insulin: Secondary | ICD-10-CM | POA: Diagnosis not present

## 2021-04-30 DIAGNOSIS — E119 Type 2 diabetes mellitus without complications: Secondary | ICD-10-CM | POA: Diagnosis not present

## 2021-04-30 DIAGNOSIS — E782 Mixed hyperlipidemia: Secondary | ICD-10-CM | POA: Diagnosis not present

## 2021-04-30 DIAGNOSIS — E1169 Type 2 diabetes mellitus with other specified complication: Secondary | ICD-10-CM | POA: Diagnosis not present

## 2021-05-07 DIAGNOSIS — I10 Essential (primary) hypertension: Secondary | ICD-10-CM | POA: Diagnosis not present

## 2021-05-07 DIAGNOSIS — Z8679 Personal history of other diseases of the circulatory system: Secondary | ICD-10-CM | POA: Diagnosis not present

## 2021-05-07 DIAGNOSIS — E782 Mixed hyperlipidemia: Secondary | ICD-10-CM | POA: Diagnosis not present

## 2021-05-07 DIAGNOSIS — R011 Cardiac murmur, unspecified: Secondary | ICD-10-CM | POA: Diagnosis not present

## 2021-05-07 DIAGNOSIS — I447 Left bundle-branch block, unspecified: Secondary | ICD-10-CM | POA: Diagnosis not present

## 2021-05-10 ENCOUNTER — Other Ambulatory Visit: Payer: Self-pay | Admitting: Family Medicine

## 2021-05-11 DIAGNOSIS — H2513 Age-related nuclear cataract, bilateral: Secondary | ICD-10-CM | POA: Diagnosis not present

## 2021-05-11 DIAGNOSIS — H43813 Vitreous degeneration, bilateral: Secondary | ICD-10-CM | POA: Diagnosis not present

## 2021-05-11 DIAGNOSIS — H353114 Nonexudative age-related macular degeneration, right eye, advanced atrophic with subfoveal involvement: Secondary | ICD-10-CM | POA: Diagnosis not present

## 2021-05-11 DIAGNOSIS — H353221 Exudative age-related macular degeneration, left eye, with active choroidal neovascularization: Secondary | ICD-10-CM | POA: Diagnosis not present

## 2021-06-14 ENCOUNTER — Ambulatory Visit: Payer: Medicare Other | Admitting: Physician Assistant

## 2021-08-03 DIAGNOSIS — H43813 Vitreous degeneration, bilateral: Secondary | ICD-10-CM | POA: Diagnosis not present

## 2021-08-03 DIAGNOSIS — H353221 Exudative age-related macular degeneration, left eye, with active choroidal neovascularization: Secondary | ICD-10-CM | POA: Diagnosis not present

## 2021-08-03 DIAGNOSIS — H2513 Age-related nuclear cataract, bilateral: Secondary | ICD-10-CM | POA: Diagnosis not present

## 2021-08-03 DIAGNOSIS — H353114 Nonexudative age-related macular degeneration, right eye, advanced atrophic with subfoveal involvement: Secondary | ICD-10-CM | POA: Diagnosis not present

## 2021-08-04 ENCOUNTER — Other Ambulatory Visit: Payer: Self-pay | Admitting: Internal Medicine

## 2021-08-13 DIAGNOSIS — E1169 Type 2 diabetes mellitus with other specified complication: Secondary | ICD-10-CM | POA: Diagnosis not present

## 2021-08-13 DIAGNOSIS — Z794 Long term (current) use of insulin: Secondary | ICD-10-CM | POA: Diagnosis not present

## 2021-08-13 DIAGNOSIS — E782 Mixed hyperlipidemia: Secondary | ICD-10-CM | POA: Diagnosis not present

## 2021-08-30 ENCOUNTER — Ambulatory Visit (INDEPENDENT_AMBULATORY_CARE_PROVIDER_SITE_OTHER): Payer: Medicare Other | Admitting: Family Medicine

## 2021-08-30 ENCOUNTER — Encounter: Payer: Self-pay | Admitting: Family Medicine

## 2021-08-30 ENCOUNTER — Other Ambulatory Visit: Payer: Self-pay

## 2021-08-30 VITALS — BP 148/73 | HR 75 | Ht 63.5 in

## 2021-08-30 DIAGNOSIS — E118 Type 2 diabetes mellitus with unspecified complications: Secondary | ICD-10-CM

## 2021-08-30 DIAGNOSIS — E782 Mixed hyperlipidemia: Secondary | ICD-10-CM

## 2021-08-30 DIAGNOSIS — Z794 Long term (current) use of insulin: Secondary | ICD-10-CM | POA: Diagnosis not present

## 2021-08-30 DIAGNOSIS — I10 Essential (primary) hypertension: Secondary | ICD-10-CM | POA: Diagnosis not present

## 2021-08-30 DIAGNOSIS — I471 Supraventricular tachycardia: Secondary | ICD-10-CM | POA: Diagnosis not present

## 2021-08-30 DIAGNOSIS — I4819 Other persistent atrial fibrillation: Secondary | ICD-10-CM | POA: Diagnosis not present

## 2021-08-30 DIAGNOSIS — G4700 Insomnia, unspecified: Secondary | ICD-10-CM

## 2021-08-30 DIAGNOSIS — J309 Allergic rhinitis, unspecified: Secondary | ICD-10-CM | POA: Diagnosis not present

## 2021-08-30 MED ORDER — AZELASTINE HCL 0.1 % NA SOLN: 2.0000 | Freq: Two times a day (BID) | NASAL | 12 refills | Status: DC

## 2021-08-30 NOTE — Assessment & Plan Note (Signed)
At goal per JNC 8.  Continue regimen per cardiology.

## 2021-08-30 NOTE — Assessment & Plan Note (Signed)
Not well controlled on Flonase.  We will add on Astelin.

## 2021-08-30 NOTE — Assessment & Plan Note (Signed)
On Crestor and fenofibrate per cardiology.

## 2021-08-30 NOTE — Progress Notes (Signed)
° °  Isabella Wright is a 76 y.o. female who presents today for an office visit.  Assessment/Plan:  Chronic Problems Addressed Today: Insomnia We reiterated sleep hygiene measures.  We discussed starting trazodone however she would like to defer for now.  She does not want to try gabapentin.  Diabetes mellitus type 2, controlled ([place]) Follows with endocrinology.  Last A1c 6.7.  This is available in Care Everywhere.  Persistent atrial fibrillation ([facility]) - s/p Ablation, miantaining NSR, CHA2DS2VASc 4 (on Xarelto) Continue management per cardiology.  She has not had any recurrences since her ablation 6 years ago.  Shee is on Xarelto.  Essential hypertension At goal per JNC 8.  Continue regimen per cardiology.  Mixed hyperlipidemia: High triglycerides, low HDL On Crestor and fenofibrate per cardiology.  Allergic rhinitis Not well controlled on Flonase.  We will add on Astelin.     Subjective:  HPI:  Patient here for follow up. Please see A/p for status of chronic conditions.         Objective:  Physical Exam: BP (!) 148/73 (BP Location: Left Arm)    Pulse 75    Ht 5' 3.5" (1.613 m)    SpO2 96%    BMI 25.25 kg/m   Gen: No acute distress, resting comfortably CV: Regular rate and rhythm with no murmurs appreciated Pulm: Normal work of breathing, clear to auscultation bilaterally with no crackles, wheezes, or rhonchi Neuro: Grossly normal, moves all extremities Psych: Normal affect and thought content      Unnamed Hino M. Jerline Pain, MD 08/30/2021 9:48 AM

## 2021-08-30 NOTE — Assessment & Plan Note (Signed)
Continue management per cardiology.  She has not had any recurrences since her ablation 6 years ago.  Shee is on Xarelto.

## 2021-08-30 NOTE — Assessment & Plan Note (Signed)
Follows with endocrinology.  Last A1c 6.7.  This is available in Care Everywhere.

## 2021-08-30 NOTE — Patient Instructions (Signed)
It was very nice to see you today!  Please try the nasal spray.  Please let me know if you would like for me to send in trazodone to help with your sleep.  Please continue to work on diet and exercise.  I will see you back in 1 year.  Come back to see me sooner if needed.  Take care, Dr Jerline Pain  PLEASE NOTE:  If you had any lab tests please let us know if you have not heard back within a few days. You may see your results on mychart before we have a chance to review them but we will give you a call once they are reviewed by [name]. If we ordered any referrals today, please let us know if you have not heard from their office within the next week.   Please try these tips to maintain a healthy lifestyle:  Eat at least 3 REAL meals and 1-2 snacks per day.  Aim for no more than 5 hours between eating.  If you eat breakfast, please do so within one hour of getting up.   Each meal should contain half fruits/vegetables, one quarter protein, and one quarter carbs (no bigger than a computer mouse)  Cut down on sweet beverages. This includes juice, soda, and sweet tea.   Drink at least 1 glass of water with each meal and aim for at least 8 glasses per day  Exercise at least 150 minutes every week.     Please try to incorporate the following into your daily routine:  1. Sleep only long enough to feel rested and then get out of bed  2. Go to bed and get up at the same time every day  3. Do not try to force yourself to sleep. If you can't sleep, get out of bed and try again later.  4. Have coffee, tea, and other foods that have caffeine only in the morning  5. Avoid alcohol in the late afternoon, evening, and bedtime  6. Avoid smoking, especially in the evening  7. Keep your bedroom dark, cool, quiet, and free of reminders of work or other things that cause you stress  8. Solve problems you have before you go to bed  9. Exercise several days a week, but not right before bed  10. Avoid  looking at phones or reading devices ("e-books") that give off light before bed. This can make it harder to fall asleep.

## 2021-08-30 NOTE — Assessment & Plan Note (Signed)
We reiterated sleep hygiene measures.  We discussed starting trazodone however she would like to defer for now.  She does not want to try gabapentin.

## 2021-09-14 ENCOUNTER — Other Ambulatory Visit: Payer: Self-pay

## 2021-09-14 ENCOUNTER — Ambulatory Visit (INDEPENDENT_AMBULATORY_CARE_PROVIDER_SITE_OTHER): Payer: Medicare Other

## 2021-09-14 DIAGNOSIS — Z0000 Encounter for general adult medical examination without abnormal findings: Secondary | ICD-10-CM

## 2021-09-14 NOTE — Patient Instructions (Signed)
Ms. Band , Thank you for taking time to come for your Medicare Wellness Visit. I appreciate your ongoing commitment to your health goals. Please review the following plan we discussed and let me know if I can assist you in the future.   Screening recommendations/referrals: Colonoscopy: Done 04/27/20 repeat every 10 years  Mammogram: Done 03/19/21 repeat every year  Recommended yearly ophthalmology/optometry visit for glaucoma screening and checkup Recommended yearly dental visit for hygiene and checkup  Vaccinations: Influenza vaccine: Done 05/01/21 repeat every year Pneumococcal vaccine: Up to date Tdap vaccine: Due and discussed  Shingles vaccine: Shingrix discussed. Please contact your pharmacy for coverage information.    Covid-19:Completed 1/21, 2/11, 04/27/20  Advanced directives: Please bring a copy of your health care power of attorney and living will to the office at your convenience.  Conditions/risks identified: Stay healthy and active  Next appointment: Follow up in one year for your annual wellness visit    Preventive Care 65 Years and Older, Female Preventive care refers to lifestyle choices and visits with your health care provider that can promote health and wellness. What does preventive care include? A yearly physical exam. This is also called an annual well check. Dental exams once or twice a year. Routine eye exams. Ask your health care provider how often you should have your eyes checked. Personal lifestyle choices, including: Daily care of your teeth and gums. Regular physical activity. Eating a healthy diet. Avoiding tobacco and drug use. Limiting alcohol use. Practicing safe sex. Taking low-dose aspirin every day. Taking vitamin and mineral supplements as recommended by your health care provider. What happens during an annual well check? The services and screenings done by your health care provider during your annual well check will depend on your age,  overall health, lifestyle risk factors, and family history of disease. Counseling  Your health care provider may ask you questions about your: Alcohol use. Tobacco use. Drug use. Emotional well-being. Home and relationship well-being. Sexual activity. Eating habits. History of falls. Memory and ability to understand (cognition). Work and work Statistician. Reproductive health. Screening  You may have the following tests or measurements: Height, weight, and BMI. Blood pressure. Lipid and cholesterol levels. These may be checked every 5 years, or more frequently if you are over 22 years old. Skin check. Lung cancer screening. You may have this screening every year starting at age 88 if you have a 30-pack-year history of smoking and currently smoke or have quit within the past 15 years. Fecal occult blood test (FOBT) of the stool. You may have this test every year starting at age 40. Flexible sigmoidoscopy or colonoscopy. You may have a sigmoidoscopy every 5 years or a colonoscopy every 10 years starting at age 26. Hepatitis C blood test. Hepatitis B blood test. Sexually transmitted disease (STD) testing. Diabetes screening. This is done by checking your blood sugar (glucose) after you have not eaten for a while (fasting). You may have this done every 1-3 years. Bone density scan. This is done to screen for osteoporosis. You may have this done starting at age 34. Mammogram. This may be done every 1-2 years. Talk to your health care provider about how often you should have regular mammograms. Talk with your health care provider about your test results, treatment options, and if necessary, the need for more tests. Vaccines  Your health care provider may recommend certain vaccines, such as: Influenza vaccine. This is recommended every year. Tetanus, diphtheria, and acellular pertussis (Tdap, Td) vaccine. You may  need a Td booster every 10 years. Zoster vaccine. You may need this after age  61. Pneumococcal 13-valent conjugate (PCV13) vaccine. One dose is recommended after age 66. Pneumococcal polysaccharide (PPSV23) vaccine. One dose is recommended after age 45. Talk to your health care provider about which screenings and vaccines you need and how often you need them. This information is not intended to replace advice given to you by your health care provider. Make sure you discuss any questions you have with your health care provider. Document Released: 07/31/2015 Document Revised: 03/23/2016 Document Reviewed: 05/05/2015 Elsevier Interactive Patient Education  2017 [place] Prevention in the Home Falls can cause injuries. They can happen to people of all ages. There are many things you can do to make your home safe and to help prevent falls. What can I do on the outside of my home? Regularly fix the edges of walkways and driveways and fix any cracks. Remove anything that might make you trip as you walk through a door, such as a raised step or threshold. Trim any bushes or trees on the path to your home. Use bright outdoor lighting. Clear any walking paths of anything that might make someone trip, such as rocks or tools. Regularly check to see if handrails are loose or broken. Make sure that both sides of any steps have handrails. Any raised decks and porches should have guardrails on the edges. Have any leaves, snow, or ice cleared regularly. Use sand or salt on walking paths during winter. Clean up any spills in your garage right away. This includes oil or grease spills. What can I do in the bathroom? Use night lights. Install grab bars by the toilet and in the tub and shower. Do not use towel bars as grab bars. Use non-skid mats or decals in the tub or shower. If you need to sit down in the shower, use a plastic, non-slip stool. Keep the floor dry. Clean up any water that spills on the floor as soon as it happens. Remove soap buildup in the tub or shower  regularly. Attach bath mats securely with double-sided non-slip rug tape. Do not have throw rugs and other things on the floor that can make you trip. What can I do in the bedroom? Use night lights. Make sure that you have a light by your bed that is easy to reach. Do not use any sheets or blankets that are too big for your bed. They should not hang down onto the floor. Have a firm chair that has side arms. You can use this for support while you get dressed. Do not have throw rugs and other things on the floor that can make you trip. What can I do in the kitchen? Clean up any spills right away. Avoid walking on wet floors. Keep items that you use a lot in easy-to-reach places. If you need to reach something above you, use a strong step stool that has a grab bar. Keep electrical cords out of the way. Do not use floor polish or wax that makes floors slippery. If you must use wax, use non-skid floor wax. Do not have throw rugs and other things on the floor that can make you trip. What can I do with my stairs? Do not leave any items on the stairs. Make sure that there are handrails on both sides of the stairs and use them. Fix handrails that are broken or loose. Make sure that handrails are as long as the stairways.  Check any carpeting to make sure that it is firmly attached to the stairs. Fix any carpet that is loose or worn. Avoid having throw rugs at the top or bottom of the stairs. If you do have throw rugs, attach them to the floor with carpet tape. Make sure that you have a light switch at the top of the stairs and the bottom of the stairs. If you do not have them, ask someone to add them for you. What else can I do to help prevent falls? Wear shoes that: Do not have high heels. Have rubber bottoms. Are comfortable and fit you well. Are closed at the toe. Do not wear sandals. If you use a stepladder: Make sure that it is fully opened. Do not climb a closed stepladder. Make sure that  both sides of the stepladder are locked into place. Ask someone to hold it for you, if possible. Clearly mark and make sure that you can see: Any grab bars or handrails. First and last steps. Where the edge of each step is. Use tools that help you move around (mobility aids) if they are needed. These include: Canes. Walkers. Scooters. Crutches. Turn on the lights when you go into a dark area. Replace any light bulbs as soon as they burn out. Set up your furniture so you have a clear path. Avoid moving your furniture around. If any of your floors are uneven, fix them. If there are any pets around you, be aware of where they are. Review your medicines with your doctor. Some medicines can make you feel dizzy. This can increase your chance of falling. Ask your doctor what other things that you can do to help prevent falls. This information is not intended to replace advice given to you by your health care provider. Make sure you discuss any questions you have with your health care provider. Document Released: 04/30/2009 Document Revised: 12/10/2015 Document Reviewed: 08/08/2014 Elsevier Interactive Patient Education  2017 [employer].

## 2021-09-14 NOTE — Progress Notes (Signed)
Virtual Visit via Telephone Note  I connected with  Isabella Wright on 09/14/21 at  9:30 AM EST by telephone and verified that I am speaking with the correct person using two identifiers.  Medicare Annual Wellness visit completed telephonically due to Covid-19 pandemic.   Persons participating in this call: This Health Coach and this patient.   Location: Patient: Home Provider: Office   I discussed the limitations, risks, security and privacy concerns of performing an evaluation and management service by telephone and the availability of in person appointments. The patient expressed understanding and agreed to proceed.  Unable to perform video visit due to video visit attempted and failed and/or patient does not have video capability.   Some vital signs may be absent or patient reported.   Willette Brace, LPN   Subjective:   Isabella Wright is a 76 y.o. female who presents for Medicare Annual (Subsequent) preventive examination.  Review of Systems     Cardiac Risk Factors include: advanced age (>36men, >49 women);hypertension;diabetes mellitus;dyslipidemia     Objective:    There were no vitals filed for this visit. There is no height or weight on file to calculate BMI.  Advanced Directives 09/14/2021 08/14/2020 01/05/2017 05/13/2016 03/15/2016  Does Patient Have a Medical Advance Directive? Yes Yes Yes Yes No  Type of Paramedic of [place];Living will - [place];Living will -  Does patient want to make changes to medical advance directive? - - - No - Patient declined -  Copy of [place] in Chart? No - copy requested No - copy requested - No - copy requested -  Would patient like information on creating a medical advance directive? - - - - No - patient declined information    Current Medications (verified) Outpatient Encounter Medications as of 09/14/2021  Medication Sig   ACCU-CHEK AVIVA  PLUS test strip 1 EACH BY OTHER ROUTE 4 (FOUR) TIMES DAILY. USE AS INSTRUCTED DIAG E11.65   ACCU-CHEK SOFTCLIX LANCETS lancets 1 EACH BY OTHER ROUTE 3 (THREE) TIMES A DAY.   acetaminophen (TYLENOL) 325 MG tablet Take 2 tablets (650 mg total) by mouth every 4 (four) hours as needed for headache or mild pain.   amLODipine (NORVASC) 10 MG tablet Take 10 mg by mouth daily.   azelastine (ASTELIN) 0.1 % nasal spray Place 2 sprays into both nostrils 2 (two) times daily.   chlorthalidone (HYGROTON) 25 MG tablet Take by mouth.   cholecalciferol (VITAMIN D) 1000 UNITS tablet Take 2,000 Units by mouth daily.    Continuous Blood Gluc Sensor (FREESTYLE LIBRE SENSOR SYSTEM) MISC 1 EACH BY DOES NOT APPLY ROUTE EVERY 10 DAYS.   Dulaglutide (TRULICITY) 1.5 TM/1.9QQ SOPN Inject 1.5 mg into the skin once a week.   fenofibrate 160 MG tablet TAKE 1 TABLET BY MOUTH EVERY DAY FOR high triglycerides   fluticasone (FLONASE) 50 MCG/ACT nasal spray Place 1-2 sprays into both nostrils at bedtime as needed for allergies or rhinitis.   hydrALAZINE (APRESOLINE) 25 MG tablet Take 1 tablet (25 mg total) by mouth 3 (three) times daily as needed. For BP > 180/120   hydrocortisone (ANUSOL-HC) 25 MG suppository Place 1 suppository (25 mg total) rectally 2 (two) times daily.   icosapent Ethyl (VASCEPA) 1 g capsule Take by mouth.   Insulin Aspart (NOVOLOG FLEXPEN [place]) Inject 10 Units into the skin 3 (three) times daily before meals. If blood sugar is higher than 120 give 10  units before each meal   insulin detemir (LEVEMIR) 100 UNIT/ML FlexPen Inject 50 Units into the skin at bedtime.    Insulin Pen Needle (NOVOFINE PEN NEEDLE) 32G X 6 MM MISC USE WITH INSULIN INJECTIONS 3 TIMES DAILY   Insulin Pen Needle 32G X 6 MM MISC SMARTSIG:Injection 3 Times Daily   metFORMIN (GLUCOPHAGE-XR) 500 MG 24 hr tablet Take 1 tablet (500 mg total) by mouth daily with breakfast. 1000 MG TAKE IN THE EVENING   metoprolol tartrate (LOPRESSOR) 25 MG tablet  TAKE 1 TABLET BY MOUTH EVERY DAY   Multiple Vitamins-Minerals (PRESERVISION AREDS 2 PO) Take 1 tablet by mouth 2 (two) times daily.    omeprazole (PRILOSEC) 20 MG capsule TAKE 1 CAPSULE BY MOUTH EVERY DAY   ramipril (ALTACE) 10 MG capsule Take 2 capsules (20 mg total) by mouth daily. Please make overdue appt with Cardiologist before anymore refills. Thank you 1st attempt   rosuvastatin (CRESTOR) 10 MG tablet TAKE 1 TABLET BY MOUTH EVERY DAY (Patient taking differently: Take 20 mg by mouth daily.)   XARELTO 20 MG TABS tablet TAKE 1 TABLET BY MOUTH EVERY DAY WITH SUPPER   clobetasol (TEMOVATE) 0.05 % external solution Apply topically. (Patient not taking: Reported on 09/14/2021)   ketoconazole (NIZORAL) 2 % shampoo SMARTSIG:Topical 2-3 Times Weekly (Patient not taking: Reported on 09/14/2021)   neomycin-polymyxin b-dexamethasone (MAXITROL) 3.5-10000-0.1 OINT Place 1 application into the left eye 2 (two) times daily. (Patient not taking: Reported on 09/14/2021)   tobramycin (TOBREX) 0.3 % ophthalmic solution Place into the left eye. (Patient not taking: Reported on 09/14/2021)   triamcinolone (KENALOG) 0.1 % Apply 1 application topically 2 (two) times daily as needed. (Patient not taking: Reported on 09/14/2021)   No facility-administered encounter medications on file as of 09/14/2021.    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   Diabetes mellitus type 2, controlled ([place]) 2012   GERD (gastroesophageal reflux disease)    History of hiatal hernia    Hypertension 2000   2009: Renal Dopplers showed R RA ostial stenosis -- (told not a problem or 20 yrs)   Hypertriglyceridemia 1996   Very labile and difficult control: Began in '96 when she began taking Premarin after hysterectomy --  triglycerides increased to 3582 neuropathy in her feet> care for by endocrinologis; levels vary based on stress.;; Labs from March 2015: TC 150, TG 745, HDL 26, LDL 45   Iron deficiency anemia 04/30/2019   Kidney stones    LBBB (left bundle branch block) 2009   Initially diagnosed as rate related during stress echo.   Macular degeneration    "dry in right; treating like wet in the left" (03/15/2016)   Microcytic anemia 04/26/2019   Paroxysmal SVT (supraventricular tachycardia) ([place]) 2009   less frequent following I&D of chest wall abscess (Jan 015), not previously documented, may have been afib.   Personal history of atrial fibrillation 01/04/2016   a. 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 ->  No evidence of acute cerebral infarct.  Brain MRI shows abnormal appearance of distal left vertebral  artery flow.   Vertebral artery occlusion, left    Noted on MRI/A of the neck in April 2018 --left vertebral artery occluded at origin and reconstitutes at level of V3.  This is similar to what was found on carotid Dopplers. -->  ASYMPTOMATIC   Past Surgical History:  Procedure Laterality Date   Abdominal Doppler ultrasound  February 2009    Right ostial renal artery stenosis; normal renal structure.   APPENDECTOMY  1968   CARDIAC ELECTROPHYSIOLOGY MAPPING AND ABLATION  2017   CYSTOSCOPY W/ URETEROSCOPY W/ LITHOTRIPSY   1999   Large calcium oxalate stone   CYSTOSCOPY/RETROGRADE/URETEROSCOPY/STONE EXTRACTION WITH BASKET  1972   ELECTROPHYSIOLOGIC STUDY N/A 05/13/2016   Procedure: Atrial Fibrillation Ablation;  Surgeon: Thompson Grayer, MD;  Location: [facility];  Service: Cardiovascular;  Laterality: N/A;   EXCISIONAL HEMORRHOIDECTOMY   1981   FRACTURE SURGERY      IRRIGATION AND DEBRIDEMENT SEBACEOUS CYST  January 2015    chest wall; with antibiotics --> following this treatment, SVT episodes became much less frequent.   NM MYOVIEW LTD  12/2015   LOW RISK.  No ischemia or infarction.  Small septal-anterior defect likely related to left bundle branch block.   ORIF PROXIMAL TIBIAL PLATEAU FRACTURE Left 2007   , Related shattered left tibial plateau with reattachment of torn ligaments and insertion of titanium plate and screws.   RENAL ARTERY DUPLEX  01/02/2014   R&L RA - mildly elevated velocities - < 60%; Bilateral Kidneys - normal shape & size.   SARCOMA EXCISION Left 2015   dermatofibro sarcoma protuberens, wide excision of groin from front side to buttocks"   SOFT TISSUE BIOPSY Left 2015   TOTAL ABDOMINAL HYSTERECTOMY  1994   TRANSTHORACIC ECHOCARDIOGRAM  12/2015   Normal LV size and function.  EF 55%.  Mild LVH.  Septal-lateral dyssynchrony (from LBBB).  No significant valvular abnormalities.   TREADMILL STRESS ECHO  01/2009   No regional wall motion abnormalities with stress == non-ischemic; rate related incomplete left bundle branch block   Family History  Problem Relation Age of Onset   Heart disease Brother        Died at 78   Heart disease Mother        Died at 57   Heart disease Father        Died at 36   Breast cancer Sister        Died at 35   Social History   Socioeconomic History   Marital status: Widowed    Spouse name: Not on file   Number of children: Not on file   Years of education: Not on file   Highest education level: Not on file  Occupational History   Occupation: retired  Tobacco Use   Smoking status: Former    Packs/day: 0.50    Years: 15.00    Pack years: 7.50    Types: Cigarettes   Smokeless tobacco: Never   Tobacco comments:    "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 She is now widowed for just under 2 years. Her husband died of cancer. They  do not have any children.   She recently (spring of 2015) moved to [place] to be closer to her brother, [name] and his family.   She been the caregiver for her ailing husband for 5 years. He passed away in March 23, 2012, and she has had prolonged period of grieving partly due to her having lost 3 members of her immediate family during that time period as well.  --- She is under a lot of stress      She is a former smoker quit years ago. She does not drink alcohol. She is active, but not routine exercise. She says that she tries to eat healthy and knows what usually affects her triglyceride levels.   Social Determinants of Health   Financial Resource Strain: Low Risk    Difficulty of Paying Living Expenses: Not hard at all  Food Insecurity: No Food Insecurity   Worried About Charity fundraiser in the Last Year: Never true   [place] in the Last Year: Never true  Transportation Needs: No Transportation Needs   Lack of Transportation (Medical): No   Lack of Transportation (Non-Medical): No  Physical Activity: Sufficiently Active   Days of Exercise per Week: 5 days   Minutes of Exercise per Session: 50 min  Stress: No Stress Concern Present   Feeling of Stress : Not at all  Social Connections: Moderately Isolated   Frequency of Communication with Friends and Family: More than three times a week   Frequency of Social Gatherings with Friends and Family: More than three times a week   Attends Religious Services: More than 4 times per year   Active Member of [employer] or Organizations: No   Attends Archivist Meetings: Never   Marital Status: Widowed    Tobacco Counseling Counseling given: Not Answered Tobacco comments: "smoked  in anesthesia school in the '70s; then just a social smoker til I quit"   Clinical Intake:  Pre-visit preparation completed: Yes  Pain : No/denies pain     BMI - recorded: 25.25 Nutritional Status: BMI 25 -29 Overweight Nutritional Risks: None Diabetes: Yes CBG done?: Yes (123 per pt) CBG resulted in Enter/ Edit results?: No Did pt. bring in CBG monitor from home?: No  How often do you need to have someone help you when you read instructions, pamphlets, or other written materials from your doctor or pharmacy?: 1 - Never  Diabetic?Nutrition Risk Assessment:  Has the patient had any N/V/D within the last 2 months?  No  Does the patient have any non-healing wounds?  No  Has the patient had any unintentional weight loss or weight gain?  No   Diabetes:  Is the patient diabetic?  Yes  If diabetic, was a CBG obtained today?  Yes  Did the patient bring in their glucometer from home?  No  How often do you monitor your CBG's? 4 times a day.   Financial Strains and Diabetes Management:  Are you having any financial strains with the device, your supplies or your medication? No .  Does the patient want to be seen by Chronic Care Management for management of their diabetes?  No  Would the patient like to be referred to a Nutritionist or for Diabetic Management?  No   Diabetic Exams:  Diabetic Eye Exam: Completed 08/03/21 Diabetic Foot Exam: Completed 08/13/21   Interpreter Needed?: No  Information entered by :: Charlott Rakes, LPN   Activities of Daily Living In your present state  of health, do you have any difficulty performing the following activities: 09/14/2021  Hearing? N  Vision? N  Difficulty concentrating or making decisions? N  Walking or climbing stairs? N  Dressing or bathing? N  Doing errands, shopping? N  Preparing Food and eating ? N  Using the Toilet? N  In the past six months, have you accidently leaked urine? N  Do you have problems with loss of bowel  control? N  Managing your Medications? N  Managing your Finances? N  Housekeeping or managing your Housekeeping? N  Some recent data might be hidden    Patient Care Team: Vivi Barrack, MD as PCP - General (Family Medicine) Leonie Man, MD as PCP - Cardiology (Cardiology) Akdamar, Murvin Donning, MD as Consulting Physician (Specialist) Verdell Carmine, MD as Consulting Physician (Internal Medicine)  Indicate any recent Medical Services you may have received from other than [facility] providers in the past year (date may be approximate).     Assessment:   This is a routine wellness examination for Isabella Wright.  Hearing/Vision screen Hearing Screening - Comments:: Denies any hearing issues  Vision Screening - Comments:: Pt follows up with Dr  Sherlynn Stalls for annaul eye exams   Dietary issues and exercise activities discussed: Current Exercise Habits: Home exercise routine, Type of exercise: walking;Other - see comments, Time (Minutes): 50, Frequency (Times/Week): 5, Weekly Exercise (Minutes/Week): 250   Goals Addressed             This Visit's Progress    Patient Stated       Continue to stay active        Depression Screen PHQ 2/9 Scores 09/14/2021 08/14/2020 04/22/2019 08/28/2017  PHQ - 2 Score 0 0 0 0  PHQ- 9 Score - - 0 -    Fall Risk Fall Risk  09/14/2021 08/14/2020 04/22/2019 08/28/2017  Falls in the past year? 0 0 0 No  Number falls in past yr: 0 0 - -  Injury with Fall? 0 0 - -  Risk for fall due to : - Impaired vision - -  Follow up Falls prevention discussed Falls prevention discussed - -    FALL RISK PREVENTION PERTAINING TO THE HOME:  Any stairs in or around the home? Yes  If so, are there any without handrails? No  Home free of loose throw rugs in walkways, pet beds, electrical cords, etc? Yes  Adequate lighting in your home to reduce risk of falls? Yes   ASSISTIVE DEVICES UTILIZED TO PREVENT FALLS:  Life alert? Yes  Use of a cane, walker or w/c? No  Grab bars  in the bathroom? Yes  Shower chair or bench in shower? Yes  Elevated toilet seat or a handicapped toilet? Yes   TIMED UP AND GO:  Was the test performed? No .  Cognitive Function:     6CIT Screen 08/14/2020  What Year? 0 points  What month? 0 points  Count back from 20 0 points  Months in reverse 0 points  Repeat phrase 0 points    Immunizations Immunization History  Administered Date(s) Administered   Fluad Quad(high Dose 65+) 04/08/2020, 05/01/2021   Influenza Split 06/05/2015   Influenza, High Dose Seasonal PF 05/01/2016, 05/22/2017   Influenza,inj,Quad PF,6+ Mos 04/13/2018   Influenza,inj,quad, With Preservative 04/16/2019   Influenza,trivalent, recombinat, inj, PF 06/05/2015   Influenza-Unspecified 04/13/2018   PFIZER(Purple Top)SARS-COV-2 Vaccination 08/08/2019, 08/29/2019, 04/27/2020   Pneumococcal Conjugate-13 08/17/2014   Pneumococcal Polysaccharide-23 08/18/2015   Pneumococcal-Unspecified 07/19/2015  Td 07/28/2005    TDAP status: Due, Education has been provided regarding the importance of this vaccine. Advised may receive this vaccine at local pharmacy or Health Dept. Aware to provide a copy of the vaccination record if obtained from local pharmacy or Health Dept. Verbalized acceptance and understanding.  Flu Vaccine status: Up to date  Pneumococcal vaccine status: Up to date  Covid-19 vaccine status: Completed vaccines  Qualifies for Shingles Vaccine? Yes   Zostavax completed No   Shingrix Completed?: No.    Education has been provided regarding the importance of this vaccine. Patient has been advised to call insurance company to determine out of pocket expense if they have not yet received this vaccine. Advised may also receive vaccine at local pharmacy or Health Dept. Verbalized acceptance and understanding.  Screening Tests Health Maintenance  Topic Date Due   Hepatitis C Screening  Never done   Zoster Vaccines- Shingrix (1 of 2) Never done    TETANUS/TDAP  07/29/2015   HEMOGLOBIN A1C  10/12/2018   COVID-19 Vaccine (4 - Booster for Pfizer series) 06/22/2020   FOOT EXAM  08/13/2022   OPHTHALMOLOGY EXAM  08/18/2022   MAMMOGRAM  03/20/2023   COLONOSCOPY (Pts 45-83yrs Insurance coverage will need to be confirmed)  02/24/2025   Pneumonia Vaccine 55+ Years old  Completed   INFLUENZA VACCINE  Completed   HPV VACCINES  Aged Out   DEXA [place] Maintenance  Health Maintenance Due  Topic Date Due   Hepatitis C Screening  Never done   Zoster Vaccines- Shingrix (1 of 2) Never done   TETANUS/TDAP  07/29/2015   HEMOGLOBIN A1C  10/12/2018   COVID-19 Vaccine (4 - Booster for Pfizer series) 06/22/2020    Colorectal cancer screening: Type of screening: Colonoscopy. Completed 02/25/15. Repeat every 10 years  Mammogram status: Completed 03/19/21. Repeat every year    Additional Screening:  Hepatitis C Screening: does qualify;   Vision Screening: Recommended annual ophthalmology exams for early detection of glaucoma and other disorders of the eye. Is the patient up to date with their annual eye exam?  Yes  Who is the provider or what is the name of the office in which the patient attends annual eye exams? Dr Sherlynn Stalls If pt is not established with a provider, would they like to be referred to a provider to establish care? No .   Dental Screening: Recommended annual dental exams for proper oral hygiene  Community Resource Referral / Chronic Care Management: CRR required this visit?  No   CCM required this visit?  No      Plan:     I have personally reviewed and noted the following in the patients chart:   Medical and social history Use of alcohol, tobacco or illicit drugs  Current medications and supplements including opioid prescriptions.  Functional ability and status Nutritional status Physical activity Advanced directives List of other physicians Hospitalizations, surgeries, and ER visits in  previous 12 months Vitals Screenings to include cognitive, depression, and falls Referrals and appointments  In addition, I have reviewed and discussed with patient certain preventive protocols, quality metrics, and best practice recommendations. A written personalized care plan for preventive services as well as general preventive health recommendations were provided to patient.     Willette Brace, LPN   9/38/1017   Nurse Notes: None

## 2021-10-16 ENCOUNTER — Other Ambulatory Visit: Payer: Self-pay | Admitting: Internal Medicine

## 2021-10-18 NOTE — Telephone Encounter (Signed)
Received refill request for Xarelto. ?Looks like patient is seeing [facility] now Michell Heinrich, MD).  Message to pharmacy to send refill request to him. ?

## 2021-10-25 ENCOUNTER — Other Ambulatory Visit: Payer: Self-pay | Admitting: Family Medicine

## 2021-10-26 DIAGNOSIS — H353221 Exudative age-related macular degeneration, left eye, with active choroidal neovascularization: Secondary | ICD-10-CM | POA: Diagnosis not present

## 2021-10-26 DIAGNOSIS — H353114 Nonexudative age-related macular degeneration, right eye, advanced atrophic with subfoveal involvement: Secondary | ICD-10-CM | POA: Diagnosis not present

## 2021-10-26 DIAGNOSIS — H43813 Vitreous degeneration, bilateral: Secondary | ICD-10-CM | POA: Diagnosis not present

## 2021-10-26 DIAGNOSIS — H2513 Age-related nuclear cataract, bilateral: Secondary | ICD-10-CM | POA: Diagnosis not present

## 2021-11-10 ENCOUNTER — Other Ambulatory Visit: Payer: Self-pay | Admitting: Family Medicine

## 2021-11-10 ENCOUNTER — Other Ambulatory Visit: Payer: Self-pay | Admitting: Internal Medicine

## 2021-11-10 NOTE — Telephone Encounter (Signed)
Pt has not seen [name] provider since 2021 and last visit was at [name] with Dr Elonda Husky. [name] and requested refill request be sent to current cardiologist. Pharmacy verbalized understanding.  ?

## 2021-11-11 DIAGNOSIS — Z794 Long term (current) use of insulin: Secondary | ICD-10-CM | POA: Diagnosis not present

## 2021-11-11 DIAGNOSIS — E119 Type 2 diabetes mellitus without complications: Secondary | ICD-10-CM | POA: Diagnosis not present

## 2021-11-11 DIAGNOSIS — E782 Mixed hyperlipidemia: Secondary | ICD-10-CM | POA: Diagnosis not present

## 2021-11-18 DIAGNOSIS — Z8679 Personal history of other diseases of the circulatory system: Secondary | ICD-10-CM | POA: Diagnosis not present

## 2021-11-18 DIAGNOSIS — R002 Palpitations: Secondary | ICD-10-CM | POA: Diagnosis not present

## 2021-12-06 DIAGNOSIS — Z8679 Personal history of other diseases of the circulatory system: Secondary | ICD-10-CM | POA: Diagnosis not present

## 2021-12-06 DIAGNOSIS — R002 Palpitations: Secondary | ICD-10-CM | POA: Diagnosis not present

## 2021-12-10 DIAGNOSIS — Z8679 Personal history of other diseases of the circulatory system: Secondary | ICD-10-CM | POA: Diagnosis not present

## 2021-12-10 DIAGNOSIS — R002 Palpitations: Secondary | ICD-10-CM | POA: Diagnosis not present

## 2021-12-10 DIAGNOSIS — Z794 Long term (current) use of insulin: Secondary | ICD-10-CM | POA: Diagnosis not present

## 2021-12-10 DIAGNOSIS — E782 Mixed hyperlipidemia: Secondary | ICD-10-CM | POA: Diagnosis not present

## 2021-12-10 DIAGNOSIS — E119 Type 2 diabetes mellitus without complications: Secondary | ICD-10-CM | POA: Diagnosis not present

## 2021-12-10 DIAGNOSIS — I447 Left bundle-branch block, unspecified: Secondary | ICD-10-CM | POA: Diagnosis not present

## 2021-12-10 DIAGNOSIS — R011 Cardiac murmur, unspecified: Secondary | ICD-10-CM | POA: Diagnosis not present

## 2021-12-10 DIAGNOSIS — I10 Essential (primary) hypertension: Secondary | ICD-10-CM | POA: Diagnosis not present

## 2022-01-25 DIAGNOSIS — H353221 Exudative age-related macular degeneration, left eye, with active choroidal neovascularization: Secondary | ICD-10-CM | POA: Diagnosis not present

## 2022-01-25 DIAGNOSIS — H353114 Nonexudative age-related macular degeneration, right eye, advanced atrophic with subfoveal involvement: Secondary | ICD-10-CM | POA: Diagnosis not present

## 2022-01-25 DIAGNOSIS — H43813 Vitreous degeneration, bilateral: Secondary | ICD-10-CM | POA: Diagnosis not present

## 2022-01-25 DIAGNOSIS — H353231 Exudative age-related macular degeneration, bilateral, with active choroidal neovascularization: Secondary | ICD-10-CM | POA: Diagnosis not present

## 2022-01-25 DIAGNOSIS — H2513 Age-related nuclear cataract, bilateral: Secondary | ICD-10-CM | POA: Diagnosis not present

## 2022-01-31 DIAGNOSIS — D1801 Hemangioma of skin and subcutaneous tissue: Secondary | ICD-10-CM | POA: Diagnosis not present

## 2022-01-31 DIAGNOSIS — L812 Freckles: Secondary | ICD-10-CM | POA: Diagnosis not present

## 2022-01-31 DIAGNOSIS — L821 Other seborrheic keratosis: Secondary | ICD-10-CM | POA: Diagnosis not present

## 2022-03-02 DIAGNOSIS — E119 Type 2 diabetes mellitus without complications: Secondary | ICD-10-CM | POA: Diagnosis not present

## 2022-03-02 DIAGNOSIS — E559 Vitamin D deficiency, unspecified: Secondary | ICD-10-CM | POA: Diagnosis not present

## 2022-03-02 DIAGNOSIS — Z794 Long term (current) use of insulin: Secondary | ICD-10-CM | POA: Diagnosis not present

## 2022-03-02 DIAGNOSIS — E782 Mixed hyperlipidemia: Secondary | ICD-10-CM | POA: Diagnosis not present

## 2022-03-09 DIAGNOSIS — H43813 Vitreous degeneration, bilateral: Secondary | ICD-10-CM | POA: Diagnosis not present

## 2022-03-09 DIAGNOSIS — H353231 Exudative age-related macular degeneration, bilateral, with active choroidal neovascularization: Secondary | ICD-10-CM | POA: Diagnosis not present

## 2022-03-09 DIAGNOSIS — H2513 Age-related nuclear cataract, bilateral: Secondary | ICD-10-CM | POA: Diagnosis not present

## 2022-03-22 DIAGNOSIS — D500 Iron deficiency anemia secondary to blood loss (chronic): Secondary | ICD-10-CM | POA: Diagnosis not present

## 2022-03-22 DIAGNOSIS — I10 Essential (primary) hypertension: Secondary | ICD-10-CM | POA: Diagnosis not present

## 2022-03-22 DIAGNOSIS — D696 Thrombocytopenia, unspecified: Secondary | ICD-10-CM | POA: Diagnosis not present

## 2022-03-24 DIAGNOSIS — D500 Iron deficiency anemia secondary to blood loss (chronic): Secondary | ICD-10-CM | POA: Diagnosis not present

## 2022-03-24 DIAGNOSIS — I10 Essential (primary) hypertension: Secondary | ICD-10-CM | POA: Diagnosis not present

## 2022-03-24 DIAGNOSIS — I4819 Other persistent atrial fibrillation: Secondary | ICD-10-CM | POA: Diagnosis not present

## 2022-03-24 DIAGNOSIS — D509 Iron deficiency anemia, unspecified: Secondary | ICD-10-CM | POA: Diagnosis not present

## 2022-03-24 DIAGNOSIS — C44799 Other specified malignant neoplasm of skin of left lower limb, including hip: Secondary | ICD-10-CM | POA: Diagnosis not present

## 2022-03-24 DIAGNOSIS — E559 Vitamin D deficiency, unspecified: Secondary | ICD-10-CM | POA: Diagnosis not present

## 2022-04-01 ENCOUNTER — Other Ambulatory Visit: Payer: Self-pay

## 2022-04-01 ENCOUNTER — Emergency Department (HOSPITAL_BASED_OUTPATIENT_CLINIC_OR_DEPARTMENT_OTHER)
Admission: EM | Admit: 2022-04-01 | Discharge: 2022-04-01 | Disposition: A | Discharge status: Home / Self Care | Payer: Medicare Other | Attending: Emergency Medicine | Admitting: Emergency Medicine

## 2022-04-01 ENCOUNTER — Encounter (HOSPITAL_BASED_OUTPATIENT_CLINIC_OR_DEPARTMENT_OTHER): Payer: Self-pay

## 2022-04-01 DIAGNOSIS — R21 Rash and other nonspecific skin eruption: Secondary | ICD-10-CM | POA: Diagnosis present

## 2022-04-01 DIAGNOSIS — B029 Zoster without complications: Secondary | ICD-10-CM | POA: Insufficient documentation

## 2022-04-01 DIAGNOSIS — E119 Type 2 diabetes mellitus without complications: Secondary | ICD-10-CM | POA: Diagnosis not present

## 2022-04-01 DIAGNOSIS — Z7901 Long term (current) use of anticoagulants: Secondary | ICD-10-CM | POA: Insufficient documentation

## 2022-04-01 DIAGNOSIS — Z7984 Long term (current) use of oral hypoglycemic drugs: Secondary | ICD-10-CM | POA: Diagnosis not present

## 2022-04-01 DIAGNOSIS — Z794 Long term (current) use of insulin: Secondary | ICD-10-CM | POA: Diagnosis not present

## 2022-04-01 DIAGNOSIS — I10 Essential (primary) hypertension: Secondary | ICD-10-CM | POA: Diagnosis not present

## 2022-04-01 DIAGNOSIS — Z79899 Other long term (current) drug therapy: Secondary | ICD-10-CM | POA: Diagnosis not present

## 2022-04-01 DIAGNOSIS — R238 Other skin changes: Secondary | ICD-10-CM

## 2022-04-01 DIAGNOSIS — B001 Herpesviral vesicular dermatitis: Secondary | ICD-10-CM | POA: Insufficient documentation

## 2022-04-01 MED ORDER — VALACYCLOVIR HCL 1 G PO TABS: 1000.0000 mg | ORAL_TABLET | Freq: Three times a day (TID) | ORAL | 0 refills | Status: AC

## 2022-04-01 NOTE — ED Provider Notes (Signed)
[facility] Provider Note   CSN: [id number] Arrival date & time: 04/01/22  1548     History {Add pertinent medical, surgical, social history, OB history to HPI:1} Chief Complaint  Patient presents with  . Rash    Isabella Wright is a 76 y.o. female.  HPI     Has been under more stress recently, niece just had surgery to evaluate brain tumor (relieved was meningioma) but Tuesday felt area on her left buttock taht felt like shingles. 43 years ago had terrible case of shingles that started similarly but she had terrible pain. This time has not had pain, slight discomfort if touching the area. No fever, nausea, vomiting or other symptoms.   Home Medications Prior to Admission medications   Medication Sig Start Date End Date Taking? Authorizing Provider  valACYclovir (VALTREX) 1000 MG tablet Take 1 tablet (1,000 mg total) by mouth 3 (three) times daily for 7 days. 04/01/22 04/08/22 Yes Gareth Morgan, MD  ACCU-CHEK AVIVA PLUS test strip 1 EACH BY OTHER ROUTE 4 (FOUR) TIMES DAILY. USE AS INSTRUCTED DIAG E11.65 03/22/16   [provider]  ACCU-CHEK SOFTCLIX LANCETS lancets 1 EACH BY OTHER ROUTE 3 (THREE) TIMES A DAY. 06/23/16   [provider]  acetaminophen (TYLENOL) 325 MG tablet Take 2 tablets (650 mg total) by mouth every 4 (four) hours as needed for headache or mild pain. 05/14/16   Erlene Quan, PA-C  amLODipine (NORVASC) 10 MG tablet Take 10 mg by mouth daily.    [provider]  azelastine (ASTELIN) 0.1 % nasal spray Place 2 sprays into both nostrils 2 (two) times daily. 08/30/21   Vivi Barrack, MD  chlorthalidone (HYGROTON) 25 MG tablet Take by mouth. 06/18/20   [provider]  cholecalciferol (VITAMIN D) 1000 UNITS tablet Take 2,000 Units by mouth daily.     [provider]  clobetasol (TEMOVATE) 0.05 % external solution Apply topically. Patient not taking: Reported on 09/14/2021 07/13/20   [provider]  Continuous Blood Gluc Sensor (FREESTYLE LIBRE SENSOR SYSTEM) MISC 1 EACH BY DOES NOT APPLY ROUTE EVERY 10 DAYS. 07/17/17   [provider]  Dulaglutide (TRULICITY) 1.5 QQ/2.2LN SOPN Inject 1.5 mg into the skin once a week.    [provider]  fenofibrate 160 MG tablet TAKE 1 TABLET BY MOUTH EVERY DAY FOR high triglycerides 11/10/21   Vivi Barrack, MD  fluticasone [facility]) 50 MCG/ACT nasal spray Place 1-2 sprays into both nostrils at bedtime as needed for allergies or rhinitis.    [provider]  hydrALAZINE (APRESOLINE) 25 MG tablet Take 1 tablet (25 mg total) by mouth 3 (three) times daily as needed. For BP > 180/120 02/12/20   Vivi Barrack, MD  hydrocortisone (ANUSOL-HC) 25 MG suppository Place 1 suppository (25 mg total) rectally 2 (two) times daily. 10/20/20   Vivi Barrack, MD  icosapent Ethyl (VASCEPA) 1 g capsule Take by mouth. 08/29/17   [provider]  Insulin Aspart (NOVOLOG FLEXPEN [place]) Inject 10 Units into the skin 3 (three) times daily before meals. If blood sugar is higher than 120 give 10 units before each meal    [provider]  insulin detemir (LEVEMIR) 100 UNIT/ML FlexPen Inject 50 Units into the skin at bedtime.  08/31/15   [provider]  Insulin Pen Needle (NOVOFINE PEN NEEDLE) 32G X 6 MM MISC USE WITH INSULIN INJECTIONS 3 TIMES DAILY 03/10/20   [provider]  Insulin Pen  Needle 32G X 6 MM MISC SMARTSIG:Injection 3 Times Daily 03/10/20   [provider]  ketoconazole (NIZORAL) 2 % shampoo SMARTSIG:Topical 2-3 Times Weekly Patient not taking: Reported on 09/14/2021 03/13/20   [provider]  metFORMIN (GLUCOPHAGE-XR) 500 MG 24 hr tablet Take 1 tablet (500 mg total) by mouth daily with breakfast. 1000 MG TAKE IN THE EVENING 05/16/16   Kerin Ransom K, PA-C  metoprolol tartrate (LOPRESSOR) 25 MG tablet TAKE 1 TABLET BY MOUTH EVERY DAY 03/04/20   Leonie Man, MD  Multiple  Vitamins-Minerals (PRESERVISION AREDS 2 PO) Take 1 tablet by mouth 2 (two) times daily.     [provider]  neomycin-polymyxin b-dexamethasone (MAXITROL) 3.5-10000-0.1 OINT Place 1 application into the left eye 2 (two) times daily. Patient not taking: Reported on 09/14/2021 01/01/20   [provider]  omeprazole (PRILOSEC) 20 MG capsule TAKE 1 CAPSULE BY MOUTH EVERY DAY 10/25/21   Vivi Barrack, MD  ramipril (ALTACE) 10 MG capsule Take 2 capsules (20 mg total) by mouth daily. Please make overdue appt with Cardiologist before anymore refills. Thank you 1st attempt 08/04/21   Thompson Grayer, MD  rosuvastatin (CRESTOR) 10 MG tablet TAKE 1 TABLET BY MOUTH EVERY DAY Patient taking differently: Take 20 mg by mouth daily. 09/22/20   Leonie Man, MD  tobramycin (TOBREX) 0.3 % ophthalmic solution Place into the left eye. Patient not taking: Reported on 09/14/2021 01/01/20   [provider]  triamcinolone (KENALOG) 0.1 % Apply 1 application topically 2 (two) times daily as needed. Patient not taking: Reported on 09/14/2021 07/13/20   [provider]  XARELTO 20 MG TABS tablet TAKE 1 TABLET BY MOUTH EVERY DAY WITH SUPPER 04/20/21   Allred, Jeneen Rinks, MD      Allergies    Fluorescein, Premarin  [conjugated estrogens], Demerol [meperidine], Exenatide, and Liraglutide    Review of Systems   Review of Systems  Physical Exam Updated Vital Signs BP (!) 163/82 (BP Location: Right Arm)   Pulse 71   Temp 98.2 F (36.8 C) (Oral)   Resp 20   Ht 5' 3.5" (1.613 m)   Wt 65.7 kg   SpO2 96%   BMI 25.26 kg/m  Physical Exam Vitals and nursing note reviewed.  Constitutional:      General: She is not in acute distress.    Appearance: Normal appearance. She is not ill-appearing, toxic-appearing or diaphoretic.  HENT:     Head: Normocephalic.  Eyes:     Conjunctiva/sclera: Conjunctivae normal.  Cardiovascular:     Rate and Rhythm: Normal rate and regular rhythm.     Pulses:  Normal pulses.  Pulmonary:     Effort: Pulmonary effort is normal. No respiratory distress.  Musculoskeletal:        General: No deformity or signs of injury.     Cervical back: No rigidity.  Skin:    General: Skin is warm and dry.     Coloration: Skin is not jaundiced or pale.     Comments: Group of 6 small vesicles on erythematous base left buttock 3x3 area  Neurological:     General: No focal deficit present.     Mental Status: She is alert and oriented to person, place, and time.    ED Results / Procedures / Treatments   Labs (all labs ordered are listed, but only abnormal results are displayed) Labs Reviewed - No data to display  EKG None  Radiology No results found.  Procedures Procedures  {Document  cardiac monitor, telemetry assessment procedure when appropriate:1}  Medications Ordered in ED Medications - No data to display  ED Course/ Medical Decision Making/ A&P                           Medical Decision Making Risk Prescription drug management.   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  It is possible it may represent a vesicular reaction to an arthropod bite, or other dermatologic phenomena--however appearance of grouped vesicles in potential dermatomal distribution will treat as shingles with valacyclovir and recommend follow up with PCP.    {Document critical care time when appropriate:1} {Document review of labs and clinical decision tools ie heart score, Chads2Vasc2 etc:1}  {Document your independent review of radiology images, and any outside records:1} {Document your discussion with family members, caretakers, and with consultants:1} {Document social determinants of health affecting pt's care:1} {Document your decision making why or why not admission, treatments were needed:1} Final Clinical Impression(s) / ED  Diagnoses Final diagnoses:  Vesicular rash  Herpes zoster without complication    Rx / DC Orders ED Discharge Orders          Ordered    valACYclovir (VALTREX) 1000 MG tablet  3 times daily        04/01/22 1714

## 2022-04-01 NOTE — ED Triage Notes (Signed)
Patient here POV from Home.  Endorses Recent Stress with Family Matters recently. States she noted an Area of Redness and Rash-Like Formation to Left Buttock for approximately 3 Days.  No Pain. No Drainage. No Fevers. Patient concerned for Shingles.    NAD Noted during Triage. A&Ox4. GCS 15. Ambulatory.

## 2022-04-04 ENCOUNTER — Encounter: Payer: Self-pay | Admitting: Family Medicine

## 2022-04-04 ENCOUNTER — Ambulatory Visit (INDEPENDENT_AMBULATORY_CARE_PROVIDER_SITE_OTHER): Payer: Medicare Other | Admitting: Family Medicine

## 2022-04-04 VITALS — BP 118/62 | HR 74 | Temp 98.4°F | Wt 146.4 lb

## 2022-04-04 DIAGNOSIS — G4700 Insomnia, unspecified: Secondary | ICD-10-CM | POA: Diagnosis not present

## 2022-04-04 DIAGNOSIS — I10 Essential (primary) hypertension: Secondary | ICD-10-CM

## 2022-04-04 MED ORDER — TRAZODONE HCL 50 MG PO TABS: 25.0000 mg | ORAL_TABLET | Freq: Every evening | ORAL | 3 refills | Status: DC | PRN

## 2022-04-04 NOTE — Patient Instructions (Signed)
It was very nice to see you today!  Please add trazodone to help you sleep.  Please finish your Valtrex.  Let me know in a few weeks how you are doing.  Take care, Dr Jerline Pain  PLEASE NOTE:  If you had any lab tests please let us know if you have not heard back within a few days. You may see your results on mychart before we have a chance to review them but we will give you a call once they are reviewed by [name]. If we ordered any referrals today, please let us know if you have not heard from their office within the next week.   Please try these tips to maintain a healthy lifestyle:  Eat at least 3 REAL meals and 1-2 snacks per day.  Aim for no more than 5 hours between eating.  If you eat breakfast, please do so within one hour of getting up.   Each meal should contain half fruits/vegetables, one quarter protein, and one quarter carbs (no bigger than a computer mouse)  Cut down on sweet beverages. This includes juice, soda, and sweet tea.   Drink at least 1 glass of water with each meal and aim for at least 8 glasses per day  Exercise at least 150 minutes every week.

## 2022-04-04 NOTE — Assessment & Plan Note (Signed)
She has been under some stress recently.  She has not been sleeping well.  This likely contributed to her recent shingles outbreak.  She is interested in starting medications.  We will start trazodone 25 to 50 mg nightly.  We discussed potential side effects.  She will follow with me in a few weeks via MyChart.

## 2022-04-04 NOTE — Assessment & Plan Note (Addendum)
Blood pressure at goal on Norvasc 10 mg daily, ramipril 20 mg daily, chlorthalidone 25 mg daily,  metoprolol tartrate 25 mg daily, hydralazine 25 mg 3 times daily as needed

## 2022-04-04 NOTE — Progress Notes (Signed)
   Isabella Wright is a 76 y.o. female who presents today for an office visit.  Assessment/Plan:  New/Acute Problems: Shingles  Improving with valtrex. She will complete her course and let us know if her rash does not continue to improve.  Chronic Problems Addressed Today: Essential hypertension Blood pressure at goal on Norvasc 10 mg daily, ramipril 20 mg daily, chlorthalidone 25 mg daily,  metoprolol tartrate 25 mg daily, hydralazine 25 mg 3 times daily as needed  Insomnia She has been under some stress recently.  She has not been sleeping well.  This likely contributed to her recent shingles outbreak.  She is interested in starting medications.  We will start trazodone 25 to 50 mg nightly.  We discussed potential side effects.  She will follow with me in a few weeks via MyChart.  She will get flu vaccine in a few weeks.  We discussed shingles vaccine however she will continue to think about this - she is not ready to commit at this time.     Subjective:  HPI:  See A/p for status of chronic conditions.    She went to the ED 3 days ago with a rash on her buttocks. She was diagnosed with shingles. She was started on valtrex and discharged home. Symptoms have improved a lot over the last couple of days.        Objective:  Physical Exam: BP 118/62   Pulse 74   Temp 98.4 F (36.9 C) (Temporal)   Wt 146 lb 6.4 oz (66.4 kg)   SpO2 97%   BMI 25.53 kg/m   Gen: No acute distress, resting comfortably Skin: Healing vesicular rash on left buttocks Neuro: Grossly normal, moves all extremities Psych: Normal affect and thought content      Dontario Evetts M. Jerline Pain, MD 04/04/2022 8:42 AM

## 2022-04-08 DIAGNOSIS — Z794 Long term (current) use of insulin: Secondary | ICD-10-CM | POA: Diagnosis not present

## 2022-04-08 DIAGNOSIS — E782 Mixed hyperlipidemia: Secondary | ICD-10-CM | POA: Diagnosis not present

## 2022-04-08 DIAGNOSIS — E119 Type 2 diabetes mellitus without complications: Secondary | ICD-10-CM | POA: Diagnosis not present

## 2022-04-08 DIAGNOSIS — I10 Essential (primary) hypertension: Secondary | ICD-10-CM | POA: Diagnosis not present

## 2022-04-08 DIAGNOSIS — Z8679 Personal history of other diseases of the circulatory system: Secondary | ICD-10-CM | POA: Diagnosis not present

## 2022-04-08 DIAGNOSIS — I447 Left bundle-branch block, unspecified: Secondary | ICD-10-CM | POA: Diagnosis not present

## 2022-04-08 DIAGNOSIS — R002 Palpitations: Secondary | ICD-10-CM | POA: Diagnosis not present

## 2022-04-11 ENCOUNTER — Encounter: Payer: Self-pay | Admitting: *Deleted

## 2022-04-19 DIAGNOSIS — H353123 Nonexudative age-related macular degeneration, left eye, advanced atrophic without subfoveal involvement: Secondary | ICD-10-CM | POA: Diagnosis not present

## 2022-04-19 DIAGNOSIS — H353114 Nonexudative age-related macular degeneration, right eye, advanced atrophic with subfoveal involvement: Secondary | ICD-10-CM | POA: Diagnosis not present

## 2022-04-19 DIAGNOSIS — H43813 Vitreous degeneration, bilateral: Secondary | ICD-10-CM | POA: Diagnosis not present

## 2022-04-19 DIAGNOSIS — H353231 Exudative age-related macular degeneration, bilateral, with active choroidal neovascularization: Secondary | ICD-10-CM | POA: Diagnosis not present

## 2022-04-23 ENCOUNTER — Other Ambulatory Visit: Payer: Self-pay | Admitting: Family Medicine

## 2022-05-03 ENCOUNTER — Encounter: Payer: Self-pay | Admitting: Family Medicine

## 2022-05-03 ENCOUNTER — Ambulatory Visit (INDEPENDENT_AMBULATORY_CARE_PROVIDER_SITE_OTHER): Payer: Medicare Other | Admitting: Family Medicine

## 2022-05-03 VITALS — BP 127/64 | HR 71 | Temp 97.8°F | Ht 63.5 in

## 2022-05-03 DIAGNOSIS — Z23 Encounter for immunization: Secondary | ICD-10-CM | POA: Diagnosis not present

## 2022-05-03 DIAGNOSIS — G4700 Insomnia, unspecified: Secondary | ICD-10-CM | POA: Diagnosis not present

## 2022-05-03 DIAGNOSIS — I10 Essential (primary) hypertension: Secondary | ICD-10-CM

## 2022-05-03 MED ORDER — CLONAZEPAM 0.5 MG PO TABS: 0.2500 mg | ORAL_TABLET | Freq: Every day | ORAL | 5 refills | Status: DC

## 2022-05-03 NOTE — Assessment & Plan Note (Signed)
Blood pressure at goal on current regimen Norvasc 10 mg daily, ramipril 20 mg daily, chlorthalidone 25 mg daily, Toprol tartrate 25 mg daily, and hydralazine 25 mg 3 times daily as needed.

## 2022-05-03 NOTE — Progress Notes (Signed)
   Isabella Wright is a 76 y.o. female who presents today for an office visit.  Assessment/Plan:  Chronic Problems Addressed Today: Insomnia Had a lengthy discussion with patient today regarding her insomnia.  She had a bad reaction with trazodone.  She does not wish to try any other antidepressant medications in the future.  She has had a bad reaction to this as well as Paxil in the past.  She has tried several other medications including Benadryl and melatonin without much improvement.  She would like to restart clonazepam.  She was on this a few years ago and did well without any significant side effects.  We did discuss long-term side effects and potential complications of benzodiazepine use including respiratory depression, physical dependency and addiction, increased risk for falls, and potentially increased risk for dementia.  We did discuss alternatives such as Ambien however she has had negative experiences with this in the past when it was used with her husband and she would like to avoid this if possible.  Given that we have tried multiple other options and that she has done well with clonazepam in the past would be reasonable to start back on a low-dose.  We will start 0.25 mg nightly as needed.  We discussed potential side effects as above and reasons to return to care.  She will follow-up in a couple weeks via [place].  Essential hypertension Blood pressure at goal on current regimen Norvasc 10 mg daily, ramipril 20 mg daily, chlorthalidone 25 mg daily, Toprol tartrate 25 mg daily, and hydralazine 25 mg 3 times daily as needed.  Flu shot given today.     Subjective:  HPI:  See A/p for status of chronic conditions.    Her main concern today is insomnia. We saw her for this about a month ago. We started her on trazodone. Unfortunately she had some negative reactions and feels like it has made her symptoms worse. She has had this issue for several years and has been on clonazepam in the  past and has done well with this.        Objective:  Physical Exam: BP 127/64   Pulse 71   Temp 97.8 F (36.6 C) (Temporal)   Ht 5' 3.5" (1.613 m)   SpO2 99%   BMI 25.53 kg/m   Gen: No acute distress, resting comfortably Neuro: Grossly normal, moves all extremities Psych: Normal affect and thought content      Zoha Spranger M. Jerline Pain, MD 05/03/2022 11:15 AM

## 2022-05-03 NOTE — Patient Instructions (Addendum)
It was very nice to see you today!  I will send in clonazepam. Please send me a message in a few weeks to let me know how this is working.   We will give your flu shot today.  Please send a message in a few weeks to let me know how you are doing.  Take care, Dr Jerline Pain  PLEASE NOTE:  If you had any lab tests please let us know if you have not heard back within a few days. You may see your results on mychart before we have a chance to review them but we will give you a call once they are reviewed by [name]. If we ordered any referrals today, please let us know if you have not heard from their office within the next week.   Please try these tips to maintain a healthy lifestyle:  Eat at least 3 REAL meals and 1-2 snacks per day.  Aim for no more than 5 hours between eating.  If you eat breakfast, please do so within one hour of getting up.   Each meal should contain half fruits/vegetables, one quarter protein, and one quarter carbs (no bigger than a computer mouse)  Cut down on sweet beverages. This includes juice, soda, and sweet tea.   Drink at least 1 glass of water with each meal and aim for at least 8 glasses per day  Exercise at least 150 minutes every week.

## 2022-05-03 NOTE — Assessment & Plan Note (Signed)
Had a lengthy discussion with patient today regarding her insomnia.  She had a bad reaction with trazodone.  She does not wish to try any other antidepressant medications in the future.  She has had a bad reaction to this as well as Paxil in the past.  She has tried several other medications including Benadryl and melatonin without much improvement.  She would like to restart clonazepam.  She was on this a few years ago and did well without any significant side effects.  We did discuss long-term side effects and potential complications of benzodiazepine use including respiratory depression, physical dependency and addiction, increased risk for falls, and potentially increased risk for dementia.  We did discuss alternatives such as Ambien however she has had negative experiences with this in the past when it was used with her husband and she would like to avoid this if possible.  Given that we have tried multiple other options and that she has done well with clonazepam in the past would be reasonable to start back on a low-dose.  We will start 0.25 mg nightly as needed.  We discussed potential side effects as above and reasons to return to care.  She will follow-up in a couple weeks via [place].

## 2022-05-17 ENCOUNTER — Encounter: Payer: Self-pay | Admitting: Family Medicine

## 2022-05-18 ENCOUNTER — Other Ambulatory Visit: Payer: Self-pay | Admitting: Family Medicine

## 2022-05-19 NOTE — Telephone Encounter (Signed)
I appreciate the update. I am glad she is doing well!

## 2022-05-23 DIAGNOSIS — Z1231 Encounter for screening mammogram for malignant neoplasm of breast: Secondary | ICD-10-CM | POA: Diagnosis not present

## 2022-05-23 LAB — HM MAMMOGRAPHY

## 2022-05-30 ENCOUNTER — Encounter: Payer: Self-pay | Admitting: Family Medicine

## 2022-05-31 DIAGNOSIS — H353211 Exudative age-related macular degeneration, right eye, with active choroidal neovascularization: Secondary | ICD-10-CM | POA: Diagnosis not present

## 2022-06-03 DIAGNOSIS — R928 Other abnormal and inconclusive findings on diagnostic imaging of breast: Secondary | ICD-10-CM | POA: Diagnosis not present

## 2022-06-03 DIAGNOSIS — R922 Inconclusive mammogram: Secondary | ICD-10-CM | POA: Diagnosis not present

## 2022-06-03 DIAGNOSIS — R921 Mammographic calcification found on diagnostic imaging of breast: Secondary | ICD-10-CM | POA: Diagnosis not present

## 2022-06-03 LAB — HM MAMMOGRAPHY

## 2022-06-07 ENCOUNTER — Encounter: Payer: Self-pay | Admitting: Family Medicine

## 2022-06-20 DIAGNOSIS — E782 Mixed hyperlipidemia: Secondary | ICD-10-CM | POA: Diagnosis not present

## 2022-06-20 DIAGNOSIS — E559 Vitamin D deficiency, unspecified: Secondary | ICD-10-CM | POA: Diagnosis not present

## 2022-06-20 DIAGNOSIS — E1169 Type 2 diabetes mellitus with other specified complication: Secondary | ICD-10-CM | POA: Diagnosis not present

## 2022-06-20 DIAGNOSIS — Z794 Long term (current) use of insulin: Secondary | ICD-10-CM | POA: Diagnosis not present

## 2022-07-13 DIAGNOSIS — H2513 Age-related nuclear cataract, bilateral: Secondary | ICD-10-CM | POA: Diagnosis not present

## 2022-07-13 DIAGNOSIS — H43813 Vitreous degeneration, bilateral: Secondary | ICD-10-CM | POA: Diagnosis not present

## 2022-07-13 DIAGNOSIS — H353231 Exudative age-related macular degeneration, bilateral, with active choroidal neovascularization: Secondary | ICD-10-CM | POA: Diagnosis not present

## 2022-08-15 DIAGNOSIS — L309 Dermatitis, unspecified: Secondary | ICD-10-CM | POA: Diagnosis not present

## 2022-08-15 DIAGNOSIS — L820 Inflamed seborrheic keratosis: Secondary | ICD-10-CM | POA: Diagnosis not present

## 2022-08-15 DIAGNOSIS — L570 Actinic keratosis: Secondary | ICD-10-CM | POA: Diagnosis not present

## 2022-08-15 DIAGNOSIS — D485 Neoplasm of uncertain behavior of skin: Secondary | ICD-10-CM | POA: Diagnosis not present

## 2022-08-22 ENCOUNTER — Emergency Department (HOSPITAL_BASED_OUTPATIENT_CLINIC_OR_DEPARTMENT_OTHER)
Admission: EM | Admit: 2022-08-22 | Discharge: 2022-08-22 | Disposition: A | Discharge status: Home / Self Care | Payer: Medicare Other | Attending: Emergency Medicine | Admitting: Emergency Medicine

## 2022-08-22 ENCOUNTER — Emergency Department (HOSPITAL_BASED_OUTPATIENT_CLINIC_OR_DEPARTMENT_OTHER): Payer: Medicare Other

## 2022-08-22 ENCOUNTER — Other Ambulatory Visit: Payer: Self-pay

## 2022-08-22 ENCOUNTER — Encounter (HOSPITAL_BASED_OUTPATIENT_CLINIC_OR_DEPARTMENT_OTHER): Payer: Self-pay

## 2022-08-22 DIAGNOSIS — Z7901 Long term (current) use of anticoagulants: Secondary | ICD-10-CM | POA: Diagnosis not present

## 2022-08-22 DIAGNOSIS — Z23 Encounter for immunization: Secondary | ICD-10-CM | POA: Insufficient documentation

## 2022-08-22 DIAGNOSIS — Z7984 Long term (current) use of oral hypoglycemic drugs: Secondary | ICD-10-CM | POA: Insufficient documentation

## 2022-08-22 DIAGNOSIS — S61200A Unspecified open wound of right index finger without damage to nail, initial encounter: Secondary | ICD-10-CM | POA: Diagnosis not present

## 2022-08-22 DIAGNOSIS — Z79899 Other long term (current) drug therapy: Secondary | ICD-10-CM | POA: Diagnosis not present

## 2022-08-22 DIAGNOSIS — Y9389 Activity, other specified: Secondary | ICD-10-CM | POA: Diagnosis not present

## 2022-08-22 DIAGNOSIS — S61301A Unspecified open wound of left index finger with damage to nail, initial encounter: Secondary | ICD-10-CM | POA: Diagnosis not present

## 2022-08-22 DIAGNOSIS — I10 Essential (primary) hypertension: Secondary | ICD-10-CM | POA: Diagnosis not present

## 2022-08-22 DIAGNOSIS — E119 Type 2 diabetes mellitus without complications: Secondary | ICD-10-CM | POA: Diagnosis not present

## 2022-08-22 DIAGNOSIS — W274XXA Contact with kitchen utensil, initial encounter: Secondary | ICD-10-CM | POA: Diagnosis not present

## 2022-08-22 DIAGNOSIS — S6991XA Unspecified injury of right wrist, hand and finger(s), initial encounter: Secondary | ICD-10-CM | POA: Diagnosis present

## 2022-08-22 DIAGNOSIS — Z794 Long term (current) use of insulin: Secondary | ICD-10-CM | POA: Diagnosis not present

## 2022-08-22 DIAGNOSIS — S61209A Unspecified open wound of unspecified finger without damage to nail, initial encounter: Secondary | ICD-10-CM

## 2022-08-22 MED ORDER — CEPHALEXIN 500 MG PO CAPS: 500.0000 mg | ORAL_CAPSULE | Freq: Four times a day (QID) | ORAL | 0 refills | Status: DC

## 2022-08-22 MED ORDER — TETANUS-DIPHTH-ACELL PERTUSSIS 5-2.5-18.5 LF-MCG/0.5 IM SUSY
0.5000 mL | PREFILLED_SYRINGE | Freq: Once | INTRAMUSCULAR | Status: AC
  Administered 2022-08-22: 0.5 mL via INTRAMUSCULAR
  Filled 2022-08-22: qty 0.5

## 2022-08-22 NOTE — ED Provider Notes (Signed)
[facility] Provider Note   CSN: [id number] Arrival date & time: 08/22/22  0845     History  Chief Complaint  Patient presents with   Finger Injury    Right index    Isabella Wright is a 77 y.o. female.  HPI   This is a 77 year old female with history of diabetes, on long-term anticoagulation usage, hypertension presenting to the emergency department due to laceration to right index finger.  This happened 3 days ago while she was using a mandolin she make a birthday cake for her family member.  She cut herself on the mandolin, applied a pressure dressing.  Laceration did not involve the nailbed, she has no change in sensation, paresthesias, nailbed involvement.  She is able to flex and extend the digit without any difficulty does not believe it is broken.  Unknown last tetanus.  Home Medications Prior to Admission medications   Medication Sig Start Date End Date Taking? Authorizing Provider  cephALEXin (KEFLEX) 500 MG capsule Take 1 capsule (500 mg total) by mouth 4 (four) times daily. 08/22/22  Yes Kord Monette, Hildred Alamin, PA-C  ACCU-CHEK AVIVA PLUS test strip 1 EACH BY OTHER ROUTE 4 (FOUR) TIMES DAILY. USE AS INSTRUCTED DIAG E11.65 03/22/16   [provider]  ACCU-CHEK SOFTCLIX LANCETS lancets 1 EACH BY OTHER ROUTE 3 (THREE) TIMES A DAY. 06/23/16   [provider]  acetaminophen (TYLENOL) 325 MG tablet Take 2 tablets (650 mg total) by mouth every 4 (four) hours as needed for headache or mild pain. 05/14/16   Erlene Quan, PA-C  amLODipine (NORVASC) 10 MG tablet Take 10 mg by mouth daily.    [provider]  azelastine (ASTELIN) 0.1 % nasal spray Place 2 sprays into both nostrils 2 (two) times daily. 08/30/21   Vivi Barrack, MD  chlorthalidone (HYGROTON) 25 MG tablet Take by mouth. 06/18/20   [provider]  cholecalciferol (VITAMIN D) 1000 UNITS tablet Take 2,000 Units by mouth daily.     [provider]   clobetasol (TEMOVATE) 0.05 % external solution Apply topically. 07/13/20   [provider]  clonazePAM (KLONOPIN) 0.5 MG tablet Take 0.5 tablets (0.25 mg total) by mouth at bedtime. 05/03/22   Vivi Barrack, MD  Continuous Blood Gluc Sensor (FREESTYLE LIBRE SENSOR SYSTEM) MISC 1 EACH BY DOES NOT APPLY ROUTE EVERY 10 DAYS. 07/17/17   [provider]  Dulaglutide (TRULICITY) 1.5 XN/2.3FT SOPN Inject 1.5 mg into the skin once a week.    [provider]  fenofibrate 160 MG tablet TAKE 1 TABLET BY MOUTH EVERY DAY high triglycerides 05/18/22   Vivi Barrack, MD  flecainide (TAMBOCOR) 50 MG tablet Take 50 mg by mouth 2 (two) times daily. 03/07/22   [provider]  fluticasone (FLONASE) 50 MCG/ACT nasal spray Place 1-2 sprays into both nostrils at bedtime as needed for allergies or rhinitis.    [provider]  hydrALAZINE (APRESOLINE) 25 MG tablet Take 1 tablet (25 mg total) by mouth 3 (three) times daily as needed. For BP > 180/120 02/12/20   Vivi Barrack, MD  hydrocortisone (ANUSOL-HC) 25 MG suppository Place 1 suppository (25 mg total) rectally 2 (two) times daily. 10/20/20   Vivi Barrack, MD  icosapent Ethyl (VASCEPA) 1 g capsule Take by mouth. 08/29/17   [provider]  Insulin Aspart (NOVOLOG FLEXPEN [place]) Inject 10 Units into the skin 3 (three) times daily before meals. If blood sugar is higher  than 120 give 10 units before each meal    [provider]  insulin detemir (LEVEMIR) 100 UNIT/ML FlexPen Inject 50 Units into the skin at bedtime.  08/31/15   [provider]  Insulin Pen Needle (NOVOFINE PEN NEEDLE) 32G X 6 MM MISC USE WITH INSULIN INJECTIONS 3 TIMES DAILY 03/10/20   [provider]  Insulin Pen Needle 32G X 6 MM MISC SMARTSIG:Injection 3 Times Daily 03/10/20   [provider]  ketoconazole (NIZORAL) 2 % shampoo  03/13/20   [provider]  metFORMIN (GLUCOPHAGE-XR) 500 MG 24 hr tablet Take 1  tablet (500 mg total) by mouth daily with breakfast. 1000 MG TAKE IN THE EVENING 05/16/16   Kerin Ransom K, PA-C  metoprolol tartrate (LOPRESSOR) 25 MG tablet TAKE 1 TABLET BY MOUTH EVERY DAY 03/04/20   Leonie Man, MD  Multiple Vitamins-Minerals (PRESERVISION AREDS 2 PO) Take 1 tablet by mouth 2 (two) times daily.     [provider]  neomycin-polymyxin b-dexamethasone (MAXITROL) 3.5-10000-0.1 OINT Place 1 application  into the left eye 2 (two) times daily. 01/01/20   [provider]  omeprazole (PRILOSEC) 20 MG capsule TAKE 1 CAPSULE BY MOUTH EVERY DAY 04/25/22   Vivi Barrack, MD  ramipril (ALTACE) 10 MG capsule Take 2 capsules (20 mg total) by mouth daily. Please make overdue appt with Cardiologist before anymore refills. Thank you 1st attempt 08/04/21   Thompson Grayer, MD  rosuvastatin (CRESTOR) 10 MG tablet TAKE 1 TABLET BY MOUTH EVERY DAY Patient taking differently: Take 20 mg by mouth daily. 09/22/20   Leonie Man, MD  tobramycin (TOBREX) 0.3 % ophthalmic solution Place into the left eye. 01/01/20   [provider]  triamcinolone (KENALOG) 0.1 % Apply 1 application  topically 2 (two) times daily as needed. 07/13/20   [provider]  XARELTO 20 MG TABS tablet TAKE 1 TABLET BY MOUTH EVERY DAY WITH SUPPER 04/20/21   Allred, Jeneen Rinks, MD      Allergies    Fluorescein, Premarin  [conjugated estrogens], Demerol [meperidine], Exenatide, Liraglutide, and Covid-19 ad26 vaccine(janssen)    Review of Systems   Review of Systems  Physical Exam Updated Vital Signs BP (!) 144/74 (BP Location: Left Arm)   Pulse 78   Temp 98.6 F (37 C) (Oral)   Resp 16   Ht '5\' 3"'$  (1.6 m)   Wt 59.9 kg   SpO2 97%   BMI 23.38 kg/m  Physical Exam Vitals and nursing note reviewed. Exam conducted with a chaperone present.  Constitutional:      Appearance: Normal appearance.  HENT:     Head: Normocephalic and atraumatic.  Eyes:     General: No scleral icterus.       Right  eye: No discharge.        Left eye: No discharge.     Extraocular Movements: Extraocular movements intact.     Pupils: Pupils are equal, round, and reactive to light.  Cardiovascular:     Rate and Rhythm: Normal rate and regular rhythm.     Pulses: Normal pulses.     Heart sounds: Normal heart sounds.     No friction rub. No gallop.  Pulmonary:     Effort: Pulmonary effort is normal. No respiratory distress.     Breath sounds: Normal breath sounds.  Abdominal:     General: Abdomen is flat. Bowel sounds are normal. There is no distension.     Palpations: Abdomen is soft.  Musculoskeletal:  General: No tenderness. Normal range of motion.  Skin:    General: Skin is warm and dry.     Capillary Refill: Capillary refill takes less than 2 seconds.     Coloration: Skin is not jaundiced.     Comments: Avulsion injury to right and next finger, no nailbed involvement or laceration.  Neurological:     Mental Status: She is alert. Mental status is at baseline.     Coordination: Coordination normal.     ED Results / Procedures / Treatments   Labs (all labs ordered are listed, but only abnormal results are displayed) Labs Reviewed - No data to display  EKG None  Radiology No results found.  Procedures Procedures    Medications Ordered in ED Medications  Tdap (BOOSTRIX) injection 0.5 mL (0.5 mLs Intramuscular Given 08/22/22 0949)    ED Course/ Medical Decision Making/ A&P Clinical Course as of 08/22/22 1113  Mon Aug 22, 2022  0928 Initially was going to proceed with radiographic imaging but after shared decision making with patient we will cancel x-ray.  Do not suspect fracture or dislocation based on physical exam. [HS]    Clinical Course User Index [HS] Sherrill Raring, PA-C                             Medical Decision Making Amount and/or Complexity of Data Reviewed Radiology: ordered.  Risk Prescription drug management.   This is a 77 year old female presenting  to the emergency department due to right index finger injury.  On exam she is neurovascular intact with brisk cap refill, no reproducible tenderness and complete ROM.  She has clear avulsion injury but there is no laceration and she would be outside the window for repair either way.  Will put antibiotics and dressing, cover with antibiotics given history of diabetes and have her follow-up closely in the outpatient setting.  Stable for discharge at this time.  Tetanus was updated during ED visit.        Final Clinical Impression(s) / ED Diagnoses Final diagnoses:  Avulsion of finger, initial encounter    Rx / DC Orders ED Discharge Orders          Ordered    cephALEXin (KEFLEX) 500 MG capsule  4 times daily        08/22/22 0930              Sherrill Raring, [place] 08/22/22 1113    Tegeler, Gwenyth Allegra, MD 08/22/22 [phone]

## 2022-08-22 NOTE — Discharge Instructions (Addendum)
Your tetanus was updated today, this should be good for the next 10 years or 6 or 7 if you have another injury such as this.  Take the Keflex 1000 mg twice daily for 5 days, this is antibiotic to help prophylactically treat infection.

## 2022-08-22 NOTE — ED Notes (Signed)
Discharge paperwork given and vrbally understood... Supplies given for her dressing.Marland KitchenMarland Kitchen

## 2022-08-22 NOTE — ED Triage Notes (Signed)
Pt to ED c/o cutt to right index finger that occurred on Friday afternoon. Pt reports cut it on a slicer when decorating a cake. Bleeding controlled in triage, currently wrapped. Unknown last tetanus shot.

## 2022-08-24 DIAGNOSIS — H353123 Nonexudative age-related macular degeneration, left eye, advanced atrophic without subfoveal involvement: Secondary | ICD-10-CM | POA: Diagnosis not present

## 2022-08-24 DIAGNOSIS — H2513 Age-related nuclear cataract, bilateral: Secondary | ICD-10-CM | POA: Diagnosis not present

## 2022-08-24 DIAGNOSIS — H353114 Nonexudative age-related macular degeneration, right eye, advanced atrophic with subfoveal involvement: Secondary | ICD-10-CM | POA: Diagnosis not present

## 2022-08-24 DIAGNOSIS — H353231 Exudative age-related macular degeneration, bilateral, with active choroidal neovascularization: Secondary | ICD-10-CM | POA: Diagnosis not present

## 2022-08-24 DIAGNOSIS — H43813 Vitreous degeneration, bilateral: Secondary | ICD-10-CM | POA: Diagnosis not present

## 2022-08-25 ENCOUNTER — Encounter (HOSPITAL_COMMUNITY): Payer: Self-pay | Admitting: *Deleted

## 2022-08-30 ENCOUNTER — Telehealth: Payer: Self-pay

## 2022-08-30 NOTE — Telephone Encounter (Signed)
        Patient  visited [facility] on 2/5    Telephone encounter attempt :  1st   Unable to [facility], [facility] [address], [place], [place] [address] Phone: [phone] Email: [email or web address]

## 2022-08-31 ENCOUNTER — Telehealth: Payer: Self-pay

## 2022-08-31 ENCOUNTER — Ambulatory Visit (INDEPENDENT_AMBULATORY_CARE_PROVIDER_SITE_OTHER): Payer: Medicare Other | Admitting: Internal Medicine

## 2022-08-31 ENCOUNTER — Encounter: Payer: Self-pay | Admitting: Internal Medicine

## 2022-08-31 VITALS — BP 138/82 | HR 67 | Temp 99.3°F | Ht 63.0 in | Wt 143.6 lb

## 2022-08-31 DIAGNOSIS — J029 Acute pharyngitis, unspecified: Secondary | ICD-10-CM

## 2022-08-31 DIAGNOSIS — J020 Streptococcal pharyngitis: Secondary | ICD-10-CM

## 2022-08-31 LAB — POCT RAPID STREP A (OFFICE): Rapid Strep A Screen: POSITIVE — AB

## 2022-08-31 MED ORDER — MAGIC MOUTHWASH W/LIDOCAINE: 5.0000 mL | Freq: Three times a day (TID) | ORAL | 0 refills | Status: DC | PRN

## 2022-08-31 MED ORDER — MAGIC MOUTHWASH W/LIDOCAINE: 5.0000 mL | Freq: Three times a day (TID) | ORAL | 0 refills | Status: AC | PRN

## 2022-08-31 MED ORDER — AMOXICILLIN 500 MG PO CAPS: 500.0000 mg | ORAL_CAPSULE | Freq: Two times a day (BID) | ORAL | 0 refills | Status: AC

## 2022-08-31 MED ORDER — PSEUDOEPHEDRINE HCL ER 120 MG PO TB12: 120.0000 mg | ORAL_TABLET | Freq: Two times a day (BID) | ORAL | 0 refills | Status: AC

## 2022-08-31 MED ORDER — LORATADINE 10 MG PO TABS: 10.0000 mg | ORAL_TABLET | Freq: Every day | ORAL | 11 refills | Status: DC

## 2022-08-31 MED ORDER — SIMPLY SALINE 0.9 % NA AERS: 2.0000 | INHALATION_SPRAY | Freq: Every day | NASAL | 1 refills | Status: AC | PRN

## 2022-08-31 NOTE — Patient Instructions (Signed)
It was a pleasure seeing you today!  Your health and satisfaction are my top priorities. If you believe your experience today was worthy of a 5-star rating, I'd be grateful for your feedback! Loralee Pacas, MD    Today's draft of the physician documented plan for today's visit: (final revisions will be visible on MyChart chart later) Strep pharyngitis -     Amoxicillin; Take 1 capsule (500 mg total) by mouth 2 (two) times daily for 10 days.  Dispense: 20 capsule; Refill: 0 -     Loratadine; Take 1 tablet (10 mg total) by mouth daily.  Dispense: 30 tablet; Refill: 11 -     Pseudoephedrine HCl ER; Take 1 tablet (120 mg total) by mouth 2 (two) times daily.  Dispense: 20 tablet; Refill: 0 -     Simply Saline; Place 2 each into the nose daily as needed.  Dispense: 500 mL; Refill: 1 -     magic mouthwash w/lidocaine; Take 5 mLs by mouth 3 (three) times daily as needed for up to 10 days for mouth pain.  Dispense: 100 mL; Refill: 0  Sore throat -     POCT rapid strep A      QUESTIONS & CONCERNS: CLINICAL: please contact us via phone [phone] OR MyChart messaging  LAB & IMAGING:   We will call you if the results are significantly abnormal or you don't use MyChart.  Most normal results will be posted to MyChart immediately and have a clinical review message by Dr. Randol Kern posted within 2-3 business days.   If you have not heard from [name] regarding the results in 2 weeks OR if you need priority reporting, please contact this office. MYCHART:  The fastest way to get your results and easiest way to stay in touch with [name] is by activating your My Chart account. Instructions are located on the last page of this paperwork.  BILLING: xray and lab orders are billed from separate companies and questions./concerns should be directed to the [facility].  For visit charges please discuss with our administrative services COMPLAINTS:  please let Dr. Randol Kern know or see the [facility], by asking at the front desk: we want you to be satisfied with every experience and we would be grateful for the opportunity to address any problems

## 2022-08-31 NOTE — Telephone Encounter (Signed)
        Patient  visited [facility] on 2/5     Telephone encounter attempt :  2nd  A HIPAA compliant voice message was left requesting a return call.  Instructed patient to call back   [facility] [phone], [place], [place] [address] Phone: [phone] Email: [email or web address]

## 2022-08-31 NOTE — Progress Notes (Signed)
[facility]: [id number]   Routine Medical Office Visit  Patient:  Isabella Wright      Age: 77 y.o.       Sex:  female  Date:   08/31/2022  PCP:    Vivi Barrack, [place] Provider: Loralee Pacas, MD   Problem Focused Charting:   Medical Decision Making per Assessment/Plan   Isabella Wright was seen today for productive cough, nasal congestion, covid test negative and throat feels raw and sore.  Strep pharyngitis -     Amoxicillin; Take 1 capsule (500 mg total) by mouth 2 (two) times daily for 10 days.  Dispense: 20 capsule; Refill: 0 -     Loratadine; Take 1 tablet (10 mg total) by mouth daily.  Dispense: 30 tablet; Refill: 11 -     Pseudoephedrine HCl ER; Take 1 tablet (120 mg total) by mouth 2 (two) times daily.  Dispense: 20 tablet; Refill: 0 -     Simply Saline; Place 2 each into the nose daily as needed.  Dispense: 500 mL; Refill: 1 -     magic mouthwash w/lidocaine; Take 5 mLs by mouth 3 (three) times daily as needed for up to 10 days for mouth pain.  Dispense: 100 mL; Refill: 0  Sore throat -     POCT rapid strep A      Subjective - Clinical Presentation:   Isabella Wright is a 77 y.o. 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   Persistent atrial fibrillation ([place]) - s/p Ablation, miantaining NSR, CHA2DS2VASc 4 (on Xarelto) 03/15/2016   Essential hypertension 03/19/2014   Encounter for long-term (current) use of other medications 12/03/2013   Renal artery stenosis ([place]) 12/03/2013   Mixed hyperlipidemia: High triglycerides, low HDL 12/03/2013   LBBB (left bundle branch block) 12/03/2013   SVT  (supraventricular tachycardia) ([place]) 12/03/2013   Diabetes mellitus type 2, controlled ([place]) 07/18/2010   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   Diabetes mellitus type 2, controlled ([place]) 2012   GERD (gastroesophageal reflux disease)    History of hiatal hernia    Hypertension 2000   2009: Renal Dopplers showed R RA ostial stenosis -- (told not a problem or 20 yrs)   Hypertriglyceridemia 1996   Very labile and difficult control: Began in '96 when she began taking Premarin after hysterectomy -- triglycerides increased to 3582 neuropathy in her feet> care for by endocrinologis; levels vary based on stress.;; Labs from March 2015: TC 150, TG 745, HDL 26, LDL 45   Iron deficiency anemia 04/30/2019   Kidney stones    LBBB (left bundle branch block) 2009   Initially diagnosed as rate related during stress echo.   Macular degeneration    "dry in right; treating like wet in the left" (03/15/2016)   Microcytic anemia 04/26/2019   Paroxysmal SVT (supraventricular tachycardia) 2009   less frequent following I&D of chest wall abscess (Jan 015), not previously documented, may have been afib.   Personal history of atrial fibrillation 01/04/2016   a. 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 ->  No evidence of acute cerebral infarct.  Brain MRI shows abnormal appearance of distal left vertebral artery flow.   Vertebral artery occlusion, left    Noted on MRI/A of the neck in April 2018 --left vertebral artery occluded at origin and reconstitutes at level of V3.  This is similar to what was found on carotid Dopplers. -->  ASYMPTOMATIC    Outpatient Medications Prior to Visit  Medication Sig    ACCU-CHEK AVIVA PLUS test strip 1 EACH BY OTHER ROUTE 4 (FOUR) TIMES DAILY. USE AS INSTRUCTED DIAG E11.65   ACCU-CHEK SOFTCLIX LANCETS lancets 1 EACH BY OTHER ROUTE 3 (THREE) TIMES A DAY.   acetaminophen (TYLENOL) 325 MG tablet Take 2 tablets (650 mg total) by mouth every 4 (four) hours as needed for headache or mild pain.   amLODipine (NORVASC) 10 MG tablet Take 10 mg by mouth daily.   azelastine (ASTELIN) 0.1 % nasal spray Place 2 sprays into both nostrils 2 (two) times daily.   B-D UF III MINI PEN NEEDLES 31G X 5 MM MISC Inject into the skin daily.   chlorthalidone (HYGROTON) 25 MG tablet Take by mouth.   cholecalciferol (VITAMIN D) 1000 UNITS tablet Take 2,000 Units by mouth daily.    clobetasol (TEMOVATE) 0.05 % external solution Apply topically.   clonazePAM (KLONOPIN) 0.5 MG tablet Take 0.5 tablets (0.25 mg total) by mouth at bedtime.   Continuous Blood Gluc Sensor (FREESTYLE LIBRE SENSOR SYSTEM) MISC 1 EACH BY DOES NOT APPLY ROUTE EVERY 10 DAYS.   fenofibrate 160 MG tablet TAKE 1 TABLET BY MOUTH EVERY DAY high triglycerides   flecainide (TAMBOCOR) 50 MG tablet Take 50 mg by mouth 2 (two) times daily.   fluticasone (FLONASE) 50 MCG/ACT nasal spray Place 1-2 sprays into both nostrils at bedtime as needed for allergies or rhinitis.   hydrALAZINE (APRESOLINE) 25 MG tablet Take 1 tablet (25 mg total) by mouth 3 (three) times daily as needed. For BP > 180/120   hydrocortisone (ANUSOL-HC) 25 MG suppository Place 1 suppository (25 mg total) rectally 2 (two) times daily.   Influenza vac split quadrivalent PF (FLUZONE HIGH-DOSE) 0.5 ML injection TO BE ADMINISTERED BY PHARMACIST FOR IMMUNIZATION   insulin aspart (FIASP FLEXTOUCH) 100 UNIT/ML FlexTouch Pen Inject 30 units into the skin before breakfast, 30 units before lunch, and 40 units before dinner. (Total daily dose = 100 units.)   Insulin Pen Needle 32G X 6 MM MISC SMARTSIG:Injection 3 Times Daily   ketoconazole (NIZORAL) 2 % shampoo     metFORMIN (GLUCOPHAGE-XR) 500 MG 24 hr tablet Take 1 tablet (500 mg total) by mouth daily with breakfast. 1000 MG TAKE IN THE EVENING   metoprolol tartrate (LOPRESSOR) 50 MG tablet Take 1 tablet by mouth 2 (two) times daily.   Multiple Vitamins-Minerals (PRESERVISION AREDS 2 PO) Take 1 tablet by mouth 2 (two) times daily.    neomycin-polymyxin b-dexamethasone (MAXITROL) 3.5-10000-0.1 OINT Place 1 application  into the left eye 2 (two) times daily.   NOVOLOG FLEXPEN 100 UNIT/ML FlexPen Inject into the skin 3 (three) times daily.   omeprazole (PRILOSEC) 20 MG capsule TAKE 1 CAPSULE BY MOUTH EVERY DAY   ramipril (ALTACE) 10 MG capsule Take 2 capsules (20 mg total) by mouth daily.  Please make overdue appt with Cardiologist before anymore refills. Thank you 1st attempt   rosuvastatin (CRESTOR) 20 MG tablet Take 1 tablet by mouth daily.   tobramycin (TOBREX) 0.3 % ophthalmic solution Place into the left eye.   TRESIBA FLEXTOUCH 200 UNIT/ML FlexTouch Pen SMARTSIG:60 Unit(s) SUB-Q Daily   triamcinolone (KENALOG) 0.1 % Apply 1 application  topically 2 (two) times daily as needed.   TRULICITY [id number] 0000000 SOPN Inject into the skin.   XARELTO 20 MG TABS tablet TAKE 1 TABLET BY MOUTH EVERY DAY WITH SUPPER   [DISCONTINUED] cephALEXin (KEFLEX) 500 MG capsule Take 1 capsule (500 mg total) by mouth 4 (four) times daily.   insulin detemir (LEVEMIR) 100 UNIT/ML FlexPen Inject 50 Units into the skin at bedtime.  (Patient not taking: Reported on 08/31/2022)   rosuvastatin (CRESTOR) 10 MG tablet TAKE 1 TABLET BY MOUTH EVERY DAY (Patient not taking: Reported on 08/31/2022)   [DISCONTINUED] Dulaglutide (TRULICITY) 1.5 0000000 SOPN Inject 1.5 mg into the skin once a week.   [DISCONTINUED] icosapent Ethyl (VASCEPA) 1 g capsule Take by mouth.   [DISCONTINUED] Insulin Aspart (NOVOLOG FLEXPEN [place]) Inject 10 Units into the skin 3 (three) times daily before meals. If blood sugar is higher than 120 give 10 units before each meal    [DISCONTINUED] Insulin Pen Needle (NOVOFINE PEN NEEDLE) 32G X 6 MM MISC USE WITH INSULIN INJECTIONS 3 TIMES DAILY   [DISCONTINUED] metoprolol tartrate (LOPRESSOR) 25 MG tablet TAKE 1 TABLET BY MOUTH EVERY DAY   No facility-administered medications prior to visit.    Chief Complaint  Patient presents with   Productive cough    Symptoms started Monday   Nasal Congestion    With post nasal drip   COVID test negative    2/13.   Throat feels raw and sore    At the center of neck.    HPI   Had dinner party, no sick contacts though Very painful sore raw throat and bad nasal congestion with cough         Objective:  Physical Exam  BP 138/82 (BP Location: Right Arm, Patient Position: Sitting)   Pulse 67   Temp 99.3 F (37.4 C) (Temporal)   Ht 5' 3"$  (1.6 m)   Wt 143 lb 9.6 oz (65.1 kg)   SpO2 94%   BMI 25.44 kg/m   Overweight  by BMI criteria but truncal adiposity (waist circumference or caliper) should be used instead. Wt Readings from Last 10 Encounters:  08/31/22 143 lb 9.6 oz (65.1 kg)  08/22/22 132 lb (59.9 kg)  04/04/22 146 lb 6.4 oz (66.4 kg)  04/01/22 144 lb 13.5 oz (65.7 kg)  06/15/20 144 lb 12.8 oz (65.7 kg)  02/12/20 144 lb 8 oz (65.5 kg)  09/03/19 147 lb 6.4 oz (66.9 kg)  06/03/19 143 lb (64.9 kg)  04/22/19 145 lb (65.8 kg)  08/31/18 144 lb (65.3 kg)   Vital signs reviewed.  Nursing notes reviewed. Weight trend reviewed. General Appearance:  Well developed, well nourished female in no acute distress.   Normal work of breathing at rest Musculoskeletal: All extremities are intact.  Neurological:  Awake, alert,  No obvious focal neurological deficits or cognitive impairments Psychiatric:  Appropriate mood, pleasant demeanor Problem-specific findings:  tiny ruptured tonsillar abscess on left, thick nasal congestion hoarse voice.   Results Reviewed: Results for orders placed or performed in visit on 08/31/22  POCT rapid strep A  Result Value Ref Range    Rapid Strep A Screen  Positive (A) Negative    Recent Results (from the past 2160 hour(s))  HM MAMMOGRAPHY     Status: None   Collection Time: 06/03/22 12:00 AM  Result Value Ref Range   HM Mammogram 0-4 Bi-Rad 0-4 Bi-Rad, Self Reported Normal    Comment: Category 3: Probably Benign  POCT rapid strep A     Status: Abnormal   Collection Time: 08/31/22 10:33 AM  Result Value Ref Range   Rapid Strep A Screen Positive (A) Negative          Signed: Loralee Pacas, MD 08/31/2022 10:58 AM

## 2022-09-19 ENCOUNTER — Ambulatory Visit (INDEPENDENT_AMBULATORY_CARE_PROVIDER_SITE_OTHER): Payer: Medicare Other | Admitting: Family Medicine

## 2022-09-19 VITALS — BP 116/68 | HR 63 | Temp 97.7°F | Ht 63.0 in | Wt 144.8 lb

## 2022-09-19 DIAGNOSIS — E118 Type 2 diabetes mellitus with unspecified complications: Secondary | ICD-10-CM

## 2022-09-19 DIAGNOSIS — R6883 Chills (without fever): Secondary | ICD-10-CM

## 2022-09-19 DIAGNOSIS — Z794 Long term (current) use of insulin: Secondary | ICD-10-CM | POA: Diagnosis not present

## 2022-09-19 DIAGNOSIS — I10 Essential (primary) hypertension: Secondary | ICD-10-CM

## 2022-09-19 DIAGNOSIS — J029 Acute pharyngitis, unspecified: Secondary | ICD-10-CM | POA: Diagnosis not present

## 2022-09-19 LAB — POCT RAPID STREP A (OFFICE): Rapid Strep A Screen: POSITIVE — AB

## 2022-09-19 LAB — POC COVID19 BINAXNOW: SARS Coronavirus 2 Ag: NEGATIVE

## 2022-09-19 LAB — POCT INFLUENZA A/B
Influenza A, POC: NEGATIVE
Influenza B, POC: NEGATIVE

## 2022-09-19 MED ORDER — AMOXICILLIN-POT CLAVULANATE 875-125 MG PO TABS: 1.0000 | ORAL_TABLET | Freq: Two times a day (BID) | ORAL | 0 refills | Status: DC

## 2022-09-19 MED ORDER — GUAIFENESIN-CODEINE 100-10 MG/5ML PO SOLN: 5.0000 mL | Freq: Three times a day (TID) | ORAL | 0 refills | Status: DC | PRN

## 2022-09-19 NOTE — Assessment & Plan Note (Signed)
Hemoglobin on Norvasc 10 mg daily, ramipril 20 mg daily, chlorthalidone 25 mg daily, metoprolol tartrate 50 mg twice daily, and hydralazine 25 mg 3 times daily as needed.

## 2022-09-19 NOTE — Assessment & Plan Note (Addendum)
Follows with endocrinology.  Last A1c 7.2.  This is available Care Everywhere.

## 2022-09-19 NOTE — Patient Instructions (Addendum)
It was very nice to see you today!  Please start the augmentin. Please start the augmentin and cough syrup.  Make sure that you are getting plenty of fluids.  Let us know if not improving.  Take care, Dr Jerline Pain  PLEASE NOTE:  If you had any lab tests, please let us know if you have not heard back within a few days. You may see your results on mychart before we have a chance to review them but we will give you a call once they are reviewed by [name].   If we ordered any referrals today, please let us know if you have not heard from their office within the next week.   If you had any urgent prescriptions sent in today, please check with the pharmacy within an hour of our visit to make sure the prescription was transmitted appropriately.   Please try these tips to maintain a healthy lifestyle:  Eat at least 3 REAL meals and 1-2 snacks per day.  Aim for no more than 5 hours between eating.  If you eat breakfast, please do so within one hour of getting up.   Each meal should contain half fruits/vegetables, one quarter protein, and one quarter carbs (no bigger than a computer mouse)  Cut down on sweet beverages. This includes juice, soda, and sweet tea.   Drink at least 1 glass of water with each meal and aim for at least 8 glasses per day  Exercise at least 150 minutes every week.

## 2022-09-19 NOTE — Progress Notes (Signed)
   Kailie FANYA MELIN is a 77 y.o. female who presents today for an office visit.  Assessment/Plan:  New/Acute Problems: Sinusitis  Her rapid strep today was positive however this is likely due to her infection from a couple of weeks ago that was adequately treated with amoxicillin.  She has likely developed sinusitis that was only partially treated with overdose of amoxicillin.  We will start Augmentin 875-125 twice daily for 10 days.  Encouraged hydration.  She can use over-the-counter meds as needed.  Will also send in guaifenesin-codeine cough syrup.  She will let us know if not improving.  Chronic Problems Addressed Today: Essential hypertension Hemoglobin on Norvasc 10 mg daily, ramipril 20 mg daily, chlorthalidone 25 mg daily, metoprolol tartrate 50 mg twice daily, and hydralazine 25 mg 3 times daily as needed.  Diabetes mellitus type 2, controlled ([place]) Follows with endocrinology.  Last A1c 7.2.  This is available Care Everywhere.     Subjective:  HPI:  Patient here with sore throat and chills. She was seen here 2 weeks ago by a different provider. Her strep test at that point was positive. She was started on amoxicillin '500mg'$  twice daily for 10 days. She did get much better while on this but her symptoms started coming back about a week ago.  She is still having some sinus congestion and head pressure. Tried taking mucienx which helped. She is having a lot of malaise. A lot of congestion. She has some thick mucus production as well.        Objective:  Physical Exam: BP 116/68   Pulse 63   Temp 97.7 F (36.5 C) (Temporal)   Ht '5\' 3"'$  (1.6 m)   Wt 144 lb 12.8 oz (65.7 kg)   SpO2 97%   BMI 25.65 kg/m   Gen: No acute distress, resting comfortably HEENT: TMs were clear effusion.  OP erythematous.  No exudate. CV: Regular rate and rhythm with no murmurs appreciated Pulm: Normal work of breathing, clear to auscultation bilaterally with no crackles, wheezes, or rhonchi Neuro: Grossly  normal, moves all extremities Psych: Normal affect and thought content      Liliauna Santoni M. Jerline Pain, MD 09/19/2022 12:38 PM

## 2022-09-26 DIAGNOSIS — Z1330 Encounter for screening examination for mental health and behavioral disorders, unspecified: Secondary | ICD-10-CM | POA: Diagnosis not present

## 2022-09-26 DIAGNOSIS — Z794 Long term (current) use of insulin: Secondary | ICD-10-CM | POA: Diagnosis not present

## 2022-09-26 DIAGNOSIS — E782 Mixed hyperlipidemia: Secondary | ICD-10-CM | POA: Diagnosis not present

## 2022-09-26 DIAGNOSIS — E1169 Type 2 diabetes mellitus with other specified complication: Secondary | ICD-10-CM | POA: Diagnosis not present

## 2022-09-26 DIAGNOSIS — E119 Type 2 diabetes mellitus without complications: Secondary | ICD-10-CM | POA: Diagnosis not present

## 2022-09-27 ENCOUNTER — Ambulatory Visit (INDEPENDENT_AMBULATORY_CARE_PROVIDER_SITE_OTHER): Payer: Medicare Other

## 2022-09-27 VITALS — Wt 144.0 lb

## 2022-09-27 DIAGNOSIS — Z0000 Encounter for general adult medical examination without abnormal findings: Secondary | ICD-10-CM | POA: Diagnosis not present

## 2022-09-27 NOTE — Patient Instructions (Signed)
Isabella Wright , Thank you for taking time to come for your Medicare Wellness Visit. I appreciate your ongoing commitment to your health goals. Please review the following plan we discussed and let me know if I can assist you in the future.   These are the goals we discussed:  Goals      LDL CALC < 100     LDL-P number < 1000 given diabetes and metabolic syndrome     Patient Stated     Continue staying active     Patient Stated     Continue to stay active      Patient Stated     Stay independent         This is a list of the screening recommended for you and due dates:  Health Maintenance  Topic Date Due   Yearly kidney health urinalysis for diabetes  Never done   Hemoglobin A1C  10/12/2018   Yearly kidney function blood test for diabetes  04/14/2019   Complete foot exam   08/13/2022   Zoster (Shingles) Vaccine (1 of 2) 12/20/2022*   Hepatitis C Screening: USPSTF Recommendation to screen - Ages 18-79 yo.  05/04/2023*   Eye exam for diabetics  08/25/2023   Medicare Annual Wellness Visit  09/27/2023   Colon Cancer Screening  02/24/2025   DTaP/Tdap/Td vaccine (4 - Td or Tdap) 08/22/2032   Pneumonia Vaccine  Completed   Flu Shot  Completed   HPV Vaccine  Aged Out   DEXA scan (bone density measurement)  Discontinued   COVID-19 Vaccine  Discontinued  *Topic was postponed. The date shown is not the original due date.    Advanced directives: Please bring a copy of your health care power of attorney and living will to the office at your convenience.  Conditions/risks identified: stay independent   Next appointment: Follow up in one year for your annual wellness visit    Preventive Care 65 Years and Older, Female Preventive care refers to lifestyle choices and visits with your health care provider that can promote health and wellness. What does preventive care include? A yearly physical exam. This is also called an annual well check. Dental exams once or twice a year. Routine  eye exams. Ask your health care provider how often you should have your eyes checked. Personal lifestyle choices, including: Daily care of your teeth and gums. Regular physical activity. Eating a healthy diet. Avoiding tobacco and drug use. Limiting alcohol use. Practicing safe sex. Taking low-dose aspirin every day. Taking vitamin and mineral supplements as recommended by your health care provider. What happens during an annual well check? The services and screenings done by your health care provider during your annual well check will depend on your age, overall health, lifestyle risk factors, and family history of disease. Counseling  Your health care provider may ask you questions about your: Alcohol use. Tobacco use. Drug use. Emotional well-being. Home and relationship well-being. Sexual activity. Eating habits. History of falls. Memory and ability to understand (cognition). Work and work Statistician. Reproductive health. Screening  You may have the following tests or measurements: Height, weight, and BMI. Blood pressure. Lipid and cholesterol levels. These may be checked every 5 years, or more frequently if you are over 2 years old. Skin check. Lung cancer screening. You may have this screening every year starting at age 70 if you have a 30-pack-year history of smoking and currently smoke or have quit within the past 15 years. Fecal occult blood test (FOBT)  of the stool. You may have this test every year starting at age 63. Flexible sigmoidoscopy or colonoscopy. You may have a sigmoidoscopy every 5 years or a colonoscopy every 10 years starting at age 54. Hepatitis C blood test. Hepatitis B blood test. Sexually transmitted disease (STD) testing. Diabetes screening. This is done by checking your blood sugar (glucose) after you have not eaten for a while (fasting). You may have this done every 1-3 years. Bone density scan. This is done to screen for osteoporosis. You may  have this done starting at age 62. Mammogram. This may be done every 1-2 years. Talk to your health care provider about how often you should have regular mammograms. Talk with your health care provider about your test results, treatment options, and if necessary, the need for more tests. Vaccines  Your health care provider may recommend certain vaccines, such as: Influenza vaccine. This is recommended every year. Tetanus, diphtheria, and acellular pertussis (Tdap, Td) vaccine. You may need a Td booster every 10 years. Zoster vaccine. You may need this after age 52. Pneumococcal 13-valent conjugate (PCV13) vaccine. One dose is recommended after age 35. Pneumococcal polysaccharide (PPSV23) vaccine. One dose is recommended after age 57. Talk to your health care provider about which screenings and vaccines you need and how often you need them. This information is not intended to replace advice given to you by your health care provider. Make sure you discuss any questions you have with your health care provider. Document Released: 07/31/2015 Document Revised: 03/23/2016 Document Reviewed: 05/05/2015 Elsevier Interactive Patient Education  2017 [employer] Prevention in the Home Falls can cause injuries. They can happen to people of all ages. There are many things you can do to make your home safe and to help prevent falls. What can I do on the outside of my home? Regularly fix the edges of walkways and driveways and fix any cracks. Remove anything that might make you trip as you walk through a door, such as a raised step or threshold. Trim any bushes or trees on the path to your home. Use bright outdoor lighting. Clear any walking paths of anything that might make someone trip, such as rocks or tools. Regularly check to see if handrails are loose or broken. Make sure that both sides of any steps have handrails. Any raised decks and porches should have guardrails on the edges. Have any  leaves, snow, or ice cleared regularly. Use sand or salt on walking paths during winter. Clean up any spills in your garage right away. This includes oil or grease spills. What can I do in the bathroom? Use night lights. Install grab bars by the toilet and in the tub and shower. Do not use towel bars as grab bars. Use non-skid mats or decals in the tub or shower. If you need to sit down in the shower, use a plastic, non-slip stool. Keep the floor dry. Clean up any water that spills on the floor as soon as it happens. Remove soap buildup in the tub or shower regularly. Attach bath mats securely with double-sided non-slip rug tape. Do not have throw rugs and other things on the floor that can make you trip. What can I do in the bedroom? Use night lights. Make sure that you have a light by your bed that is easy to reach. Do not use any sheets or blankets that are too big for your bed. They should not hang down onto the floor. Have a firm  chair that has side arms. You can use this for support while you get dressed. Do not have throw rugs and other things on the floor that can make you trip. What can I do in the kitchen? Clean up any spills right away. Avoid walking on wet floors. Keep items that you use a lot in easy-to-reach places. If you need to reach something above you, use a strong step stool that has a grab bar. Keep electrical cords out of the way. Do not use floor polish or wax that makes floors slippery. If you must use wax, use non-skid floor wax. Do not have throw rugs and other things on the floor that can make you trip. What can I do with my stairs? Do not leave any items on the stairs. Make sure that there are handrails on both sides of the stairs and use them. Fix handrails that are broken or loose. Make sure that handrails are as long as the stairways. Check any carpeting to make sure that it is firmly attached to the stairs. Fix any carpet that is loose or worn. Avoid  having throw rugs at the top or bottom of the stairs. If you do have throw rugs, attach them to the floor with carpet tape. Make sure that you have a light switch at the top of the stairs and the bottom of the stairs. If you do not have them, ask someone to add them for you. What else can I do to help prevent falls? Wear shoes that: Do not have high heels. Have rubber bottoms. Are comfortable and fit you well. Are closed at the toe. Do not wear sandals. If you use a stepladder: Make sure that it is fully opened. Do not climb a closed stepladder. Make sure that both sides of the stepladder are locked into place. Ask someone to hold it for you, if possible. Clearly mark and make sure that you can see: Any grab bars or handrails. First and last steps. Where the edge of each step is. Use tools that help you move around (mobility aids) if they are needed. These include: Canes. Walkers. Scooters. Crutches. Turn on the lights when you go into a dark area. Replace any light bulbs as soon as they burn out. Set up your furniture so you have a clear path. Avoid moving your furniture around. If any of your floors are uneven, fix them. If there are any pets around you, be aware of where they are. Review your medicines with your doctor. Some medicines can make you feel dizzy. This can increase your chance of falling. Ask your doctor what other things that you can do to help prevent falls. This information is not intended to replace advice given to you by your health care provider. Make sure you discuss any questions you have with your health care provider. Document Released: 04/30/2009 Document Revised: 12/10/2015 Document Reviewed: 08/08/2014 Elsevier Interactive Patient Education  2017 [employer].

## 2022-09-27 NOTE — Progress Notes (Signed)
I connected with  Isabella Wright on 09/27/22 by a audio enabled telemedicine application and verified that I am speaking with the correct person using two identifiers.  Patient Location: Home  Provider Location: Office/Clinic  I discussed the limitations of evaluation and management by telemedicine. The patient expressed understanding and agreed to proceed.   Subjective:   Isabella Wright is a 77 y.o. female who presents for Medicare Annual (Subsequent) preventive examination.  Review of Systems     Cardiac Risk Factors include: advanced age (>59mn, >>92women);diabetes mellitus;hypertension;dyslipidemia     Objective:    Today's Vitals   09/27/22 0932  Weight: 144 lb (65.3 kg)   Body mass index is 25.51 kg/m.     09/27/2022    9:40 AM 08/22/2022    9:04 AM 04/01/2022    4:02 PM 09/14/2021    9:38 AM 08/14/2020    1:12 PM 01/05/2017    7:52 PM 05/13/2016    6:49 AM  Advanced Directives  Does Patient Have a Medical Advance Directive? Yes Yes No Yes Yes Yes Yes  Type of AParamedicof [place]Living will Living will  Healthcare Power of [place] will  [place] will  Does patient want to make changes to medical advance directive?       No - Patient declined  Copy of [place] Chart? No - copy requested   No - copy requested No - copy requested  No - copy requested  Would patient like information on creating a medical advance directive?   No - Patient declined        Current Medications (verified) Outpatient Encounter Medications as of 09/27/2022  Medication Sig   ACCU-CHEK AVIVA PLUS test strip 1 EACH BY OTHER ROUTE 4 (FOUR) TIMES DAILY. USE AS INSTRUCTED DIAG E11.65   ACCU-CHEK SOFTCLIX LANCETS lancets 1 EACH BY OTHER ROUTE 3 (THREE) TIMES A DAY.   acetaminophen (TYLENOL) 325 MG tablet Take 2 tablets (650 mg total) by mouth every 4 (four) hours as needed for headache or mild  pain.   amLODipine (NORVASC) 10 MG tablet Take 10 mg by mouth daily.   amoxicillin-clavulanate (AUGMENTIN) 875-125 MG tablet Take 1 tablet by mouth 2 (two) times daily.   azelastine (ASTELIN) 0.1 % nasal spray Place 2 sprays into both nostrils 2 (two) times daily.   B-D UF III MINI PEN NEEDLES 31G X 5 MM MISC Inject into the skin daily.   chlorthalidone (HYGROTON) 25 MG tablet Take by mouth.   cholecalciferol (VITAMIN D) 1000 UNITS tablet Take 2,000 Units by mouth daily.    clobetasol (TEMOVATE) 0.05 % external solution Apply topically.   clonazePAM (KLONOPIN) 0.5 MG tablet Take 0.5 tablets (0.25 mg total) by mouth at bedtime.   Continuous Blood Gluc Sensor (FREESTYLE LIBRE SENSOR SYSTEM) MISC 1 EACH BY DOES NOT APPLY ROUTE EVERY 10 DAYS.   fenofibrate 160 MG tablet TAKE 1 TABLET BY MOUTH EVERY DAY high triglycerides   flecainide (TAMBOCOR) 50 MG tablet Take 50 mg by mouth 2 (two) times daily.   fluticasone (FLONASE) 50 MCG/ACT nasal spray Place 1-2 sprays into both nostrils at bedtime as needed for allergies or rhinitis.   hydrALAZINE (APRESOLINE) 25 MG tablet Take 1 tablet (25 mg total) by mouth 3 (three) times daily as needed. For BP > 180/120   hydrocortisone (ANUSOL-HC) 25 MG suppository Place 1 suppository (25 mg total) rectally 2 (two) times daily.   insulin  aspart (FIASP FLEXTOUCH) 100 UNIT/ML FlexTouch Pen Inject 30 units into the skin before breakfast, 30 units before lunch, and 40 units before dinner. (Total daily dose = 100 units.)   Insulin Pen Needle 32G X 6 MM MISC SMARTSIG:Injection 3 Times Daily   ketoconazole (NIZORAL) 2 % shampoo    loratadine (CLARITIN) 10 MG tablet Take 1 tablet (10 mg total) by mouth daily.   metFORMIN (GLUCOPHAGE-XR) 500 MG 24 hr tablet Take 1 tablet (500 mg total) by mouth daily with breakfast. 1000 MG TAKE IN THE EVENING   metoprolol tartrate (LOPRESSOR) 50 MG tablet Take 1 tablet by mouth 2 (two) times daily.   Multiple Vitamins-Minerals (PRESERVISION  AREDS 2 PO) Take 1 tablet by mouth 2 (two) times daily.    neomycin-polymyxin b-dexamethasone (MAXITROL) 3.5-10000-0.1 OINT Place 1 application  into the left eye 2 (two) times daily.   Omega-3 Fatty Acids (OMEGA-3 FISH OIL PO) Take by mouth.   omeprazole (PRILOSEC) 20 MG capsule TAKE 1 CAPSULE BY MOUTH EVERY DAY   pseudoephedrine (SUDAFED 12 HOUR) 120 MG 12 hr tablet Take 1 tablet (120 mg total) by mouth 2 (two) times daily.   ramipril (ALTACE) 10 MG capsule Take 2 capsules (20 mg total) by mouth daily. Please make overdue appt with Cardiologist before anymore refills. Thank you 1st attempt   rosuvastatin (CRESTOR) 20 MG tablet Take 1 tablet by mouth daily.   Saline (SIMPLY SALINE) 0.9 % AERS Place 2 each into the nose daily as needed.   tobramycin (TOBREX) 0.3 % ophthalmic solution Place into the left eye.   TRESIBA FLEXTOUCH 200 UNIT/ML FlexTouch Pen SMARTSIG:60 Unit(s) SUB-Q Daily   triamcinolone (KENALOG) 0.1 % Apply 1 application  topically 2 (two) times daily as needed.   TRULICITY [id number] SOPN Inject into the skin.   XARELTO 20 MG TABS tablet TAKE 1 TABLET BY MOUTH EVERY DAY WITH SUPPER   Influenza vac split quadrivalent PF (FLUZONE HIGH-DOSE) 0.5 ML injection TO BE ADMINISTERED BY PHARMACIST FOR IMMUNIZATION   [DISCONTINUED] guaiFENesin-codeine 100-10 MG/5ML syrup Take 5 mLs by mouth 3 (three) times daily as needed for cough.   [DISCONTINUED] insulin detemir (LEVEMIR) 100 UNIT/ML FlexPen Inject 50 Units into the skin at bedtime.   [DISCONTINUED] NOVOLOG FLEXPEN 100 UNIT/ML FlexPen Inject into the skin 3 (three) times daily.   [DISCONTINUED] rosuvastatin (CRESTOR) 10 MG tablet TAKE 1 TABLET BY MOUTH EVERY DAY (Patient not taking: Reported on 08/31/2022)   No facility-administered encounter medications on file as of 09/27/2022.    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   Diabetes mellitus type 2, controlled ([place]) 2012   GERD (gastroesophageal reflux disease)    History of hiatal hernia    Hypertension 2000   2009: Renal Dopplers showed R RA ostial stenosis -- (told not a problem or 20 yrs)   Hypertriglyceridemia 1996   Very labile and difficult control: Began in '96 when she began taking Premarin after hysterectomy -- triglycerides increased to 3582 neuropathy in her feet> care for by endocrinologis; levels vary based on stress.;; Labs from March 2015: TC 150, TG 745, HDL 26, LDL 45   Iron deficiency anemia 04/30/2019   Kidney stones    LBBB (left bundle branch block) 2009   Initially diagnosed as rate related during stress echo.  Macular degeneration    "dry in right; treating like wet in the left" (03/15/2016)   Microcytic anemia 04/26/2019   Paroxysmal SVT (supraventricular tachycardia) 2009   less frequent following I&D of chest wall abscess (Jan 015), not previously documented, may have been afib.   Personal history of atrial fibrillation 01/04/2016   a. 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 ->  No evidence of acute cerebral infarct.  Brain MRI shows abnormal appearance of distal left vertebral artery flow.   Vertebral artery occlusion, left    Noted on MRI/A of the neck in April 2018 --left vertebral artery occluded at origin and reconstitutes at level of V3.  This is similar to what was found on carotid Dopplers. -->  ASYMPTOMATIC   Past Surgical History:  Procedure Laterality Date   Abdominal Doppler ultrasound  February 2009    Right ostial renal artery stenosis; normal renal  structure.   APPENDECTOMY  1968   CARDIAC ELECTROPHYSIOLOGY MAPPING AND ABLATION  2017   CYSTOSCOPY W/ URETEROSCOPY W/ LITHOTRIPSY   1999   Large calcium oxalate stone   CYSTOSCOPY/RETROGRADE/URETEROSCOPY/STONE EXTRACTION WITH BASKET  1972   ELECTROPHYSIOLOGIC STUDY N/A 05/13/2016   Procedure: Atrial Fibrillation Ablation;  Surgeon: Thompson Grayer, MD;  Location: [facility];  Service: Cardiovascular;  Laterality: N/A;   EXCISIONAL HEMORRHOIDECTOMY   1981   FRACTURE SURGERY     IRRIGATION AND DEBRIDEMENT SEBACEOUS CYST  January 2015    chest wall; with antibiotics --> following this treatment, SVT episodes became much less frequent.   NM MYOVIEW LTD  12/2015   LOW RISK.  No ischemia or infarction.  Small septal-anterior defect likely related to left bundle branch block.   ORIF PROXIMAL TIBIAL PLATEAU FRACTURE Left 2007   , Related shattered left tibial plateau with reattachment of torn ligaments and insertion of titanium plate and screws.   RENAL ARTERY DUPLEX  01/02/2014   R&L RA - mildly elevated velocities - < 60%; Bilateral Kidneys - normal shape & size.   SARCOMA EXCISION Left 2015   dermatofibro sarcoma protuberens, wide excision of groin from front side to buttocks"   SOFT TISSUE BIOPSY Left 2015   TOTAL ABDOMINAL HYSTERECTOMY  1994   TRANSTHORACIC ECHOCARDIOGRAM  12/2015   Normal LV size and function.  EF 55%.  Mild LVH.  Septal-lateral dyssynchrony (from LBBB).  No significant valvular abnormalities.   TREADMILL STRESS ECHO  01/2009   No regional wall motion abnormalities with stress == non-ischemic; rate related incomplete left bundle branch block   Family History  Problem Relation Age of Onset   Heart disease Brother        Died at 39   Heart disease Mother        Died at 70   Heart disease Father        Died at 46   Breast cancer Sister        Died at 65   Social History   Socioeconomic History   Marital status: Widowed    Spouse name: Not on file   Number of  children: Not on file   Years of education: Not on file   Highest education level: Not on file  Occupational History   Occupation: retired  Tobacco Use   Smoking status: Former    Packs/day: 0.50    Years: 15.00    Total pack years: 7.50    Types: Cigarettes   Smokeless tobacco: Never   Tobacco comments:    "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 She is now widowed for just under 2 years. Her husband died of cancer. They do not have any children.   She recently (spring of 2015) moved to [place] to be closer to her brother, [name] and his family.   She been the caregiver for her ailing husband for 5 years. He passed away in 2012/03/22, and she has had prolonged period of grieving partly due to her having lost 3 members of her immediate family during that time period as well.  --- She is under a lot of stress      She is a former smoker quit years ago. She does not drink alcohol. She is active, but not routine exercise. She says that she tries to eat healthy and knows what usually affects her triglyceride levels.   Social Determinants of Health   Financial Resource Strain: Low Risk  (09/27/2022)   Overall Financial Resource Strain (CARDIA)    Difficulty of Paying Living Expenses: Not hard at all  Food Insecurity: No Food Insecurity (09/27/2022)   Hunger Vital Sign    Worried About Running Out of Food in the Last Year: Never true    Ran Out of Food in the Last Year: Never true  Transportation Needs: No Transportation Needs (09/27/2022)   PRAPARE - Hydrologist (Medical): No    Lack of Transportation (Non-Medical): No  Physical Activity: Sufficiently  Active (09/27/2022)   Exercise Vital Sign    Days of Exercise per Week: 5 days    Minutes of Exercise per Session: 50 min  Stress: No Stress Concern Present (09/27/2022)   Advance    Feeling of Stress : Not at all  Social Connections: Moderately Integrated (09/27/2022)   Social Connection and Isolation Panel [NHANES]    Frequency of Communication with Friends and Family: More than three times a week    Frequency of Social Gatherings with Friends and Family: More than three times a week    Attends Religious Services: More than 4 times per year    Active Member of [employer] or Organizations: Yes    Attends Archivist Meetings: 1 to 4 times per year    Marital Status: Widowed    Tobacco Counseling Counseling given: Not Answered Tobacco comments: "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 bring in CBG monitor from home?: No  How often do you need to have someone help you when you read instructions, pamphlets, or other written materials  from your doctor or pharmacy?: 1 - Never  Diabetic?Nutrition Risk Assessment:  Has the patient had any N/V/D within the last 2 months?  No  Does the patient have any non-healing wounds?  No  Has the patient had any unintentional weight loss or weight gain?  No   Diabetes:  Is the patient diabetic?  Yes  If diabetic, was a CBG obtained today?  Yes  Did the patient bring in their glucometer from home?  No  How often do you monitor your CBG's? Daily .   Financial Strains and Diabetes Management:  Are you having any financial strains with the device, your supplies or your  medication? No .  Does the patient want to be seen by Chronic Care Management for management of their diabetes?  No  Would the patient like to be referred to a Nutritionist or for Diabetic Management?  No   Diabetic Exams:  Diabetic Eye Exam: Completed 08/24/22 pt follows up every 6 weeks  Diabetic Foot Exam: Overdue, Pt has been advised about the importance in completing this exam. Pt is scheduled for diabetic foot exam on ext appt .   Interpreter Needed?: No  Information entered by :: Charlott Rakes, LPN   Activities of Daily Living    09/27/2022    9:41 AM  In your present state of health, do you have any difficulty performing the following activities:  Hearing? 0  Vision? 0  Difficulty concentrating or making decisions? 0  Walking or climbing stairs? 0  Dressing or bathing? 0  Doing errands, shopping? 0  Preparing Food and eating ? N  Using the Toilet? N  In the past six months, have you accidently leaked urine? N  Do you have problems with loss of bowel control? N  Managing your Medications? N  Managing your Finances? N  Housekeeping or managing your Housekeeping? N    Patient Care Team: Vivi Barrack, MD as PCP - General (Family Medicine) Delle Reining, Murvin Donning, MD as Consulting Physician (Specialist) Verdell Carmine, MD as Consulting Physician (Internal Medicine)  Indicate any recent Medical Services you may have received from other than [facility] providers in the past year (date may be approximate).     Assessment:   This is a routine wellness examination for Lonisha.  Hearing/Vision screen Hearing Screening - Comments:: Pt denies any hearing issues  Vision Screening - Comments:: Pt follows up with Dr Sherlynn Stalls for annual eye exams  Dietary issues and exercise activities discussed:     Goals Addressed             This Visit's Progress    Patient Stated       Stay independent        Depression Screen    09/27/2022    9:41 AM 09/19/2022   11:33 AM  04/04/2022    8:07 AM 09/14/2021    9:37 AM 08/14/2020    1:11 PM 04/22/2019    9:28 AM 08/28/2017    3:08 PM  PHQ 2/9 Scores  PHQ - 2 Score 0 0 0 0 0 0 0  PHQ- 9 Score      0     Fall Risk    09/27/2022    9:41 AM 09/19/2022   11:33 AM 04/04/2022    8:07 AM 09/14/2021    9:40 AM 08/14/2020    1:14 PM  Fall Risk   Falls in the past year? 0 0 0 0 0  Number falls in past yr: 0  0 0 0 0  Injury with Fall? 0 0 0 0 0  Risk for fall due to : Impaired vision No Fall Risks No Fall Risks  Impaired vision  Follow up Falls prevention discussed   Falls prevention discussed Falls prevention discussed    FALL RISK PREVENTION PERTAINING TO THE HOME:  Any stairs in or around the home? Yes  If so, are there any without handrails? No  Home free of loose throw rugs in walkways, pet beds, electrical cords, etc? Yes  Adequate lighting in your home to reduce risk of falls? Yes   ASSISTIVE DEVICES UTILIZED TO PREVENT FALLS:  Life alert? Yes  Use of a cane, walker or w/c? No  Grab bars in the bathroom? Yes  Shower chair or bench in shower? Yes  Elevated toilet seat or a handicapped toilet? Yes   TIMED UP AND GO:  Was the test performed? No  Cognitive Function:        09/27/2022    9:43 AM 08/14/2020    1:16 PM  6CIT Screen  What Year? 0 points 0 points  What month? 0 points 0 points  What time? 0 points   Count back from 20 0 points 0 points  Months in reverse 0 points 0 points  Repeat phrase 0 points 0 points  Total Score 0 points     Immunizations Immunization History  Administered Date(s) Administered   Fluad Quad(high Dose 65+) 04/08/2020, 05/01/2021, 05/03/2022   Influenza Inj Mdck Quad Pf 04/16/2019   Influenza Split 06/05/2015   Influenza, High Dose Seasonal PF 06/05/2015, 05/01/2016, 05/22/2017, 04/13/2018, 04/13/2020   Influenza,inj,Quad PF,6+ Mos 04/13/2018   Influenza,inj,quad, With Preservative 04/16/2019   Influenza,trivalent, recombinat, inj, PF 06/05/2015    Influenza-Unspecified 04/13/2018   PFIZER(Purple Top)SARS-COV-2 Vaccination 08/08/2019, 08/29/2019, 04/27/2020   Pneumococcal Conjugate-13 08/17/2014   Pneumococcal Polysaccharide-23 08/18/2015   Pneumococcal-Unspecified 07/19/2015   Td 07/28/2005   Td (Adult), 2 Lf Tetanus Toxid, Preservative Free 07/28/2005   Tdap 08/22/2022    TDAP status: Up to date  Flu Vaccine status: Up to date  Pneumococcal vaccine status: Up to date  Covid-19 vaccine status: Completed vaccines  Qualifies for Shingles Vaccine? Yes   Zostavax completed No   Shingrix Completed?: No.    Education has been provided regarding the importance of this vaccine. Patient has been advised to call insurance company to determine out of pocket expense if they have not yet received this vaccine. Advised may also receive vaccine at local pharmacy or Health Dept. Verbalized acceptance and understanding.  Screening Tests Health Maintenance  Topic Date Due   Diabetic kidney evaluation - Urine ACR  Never done   HEMOGLOBIN A1C  10/12/2018   Diabetic kidney evaluation - eGFR measurement  04/14/2019   FOOT EXAM  08/13/2022   Zoster Vaccines- Shingrix (1 of 2) 12/20/2022 (Originally 11/12/1964)   Hepatitis C Screening  05/04/2023 (Originally 11/12/1963)   OPHTHALMOLOGY EXAM  08/25/2023   Medicare Annual Wellness (AWV)  09/27/2023   COLONOSCOPY (Pts 45-76yr Insurance coverage will need to be confirmed)  02/24/2025   DTaP/Tdap/Td (4 - Td or Tdap) 08/22/2032   Pneumonia Vaccine 76 Years old  Completed   INFLUENZA VACCINE  Completed   HPV VACCINES  Aged Out   DEXA SCAN  Discontinued   COVID-19 Vaccine  Discontinued    Health Maintenance  Health Maintenance Due  Topic Date Due   Diabetic kidney evaluation - Urine ACR  Never done   HEMOGLOBIN A1C  10/12/2018  Diabetic kidney evaluation - eGFR measurement  04/14/2019   FOOT EXAM  08/13/2022    Colorectal cancer screening: Type of screening: Colonoscopy. Completed  8/10//16. Repeat every 10 years  Mammogram status: Completed 06/03/22. Repeat every year     Additional Screening:  Hepatitis C Screening: does qualify;  Vision Screening: Recommended annual ophthalmology exams for early detection of glaucoma and other disorders of the eye. Is the patient up to date with their annual eye exam?  Yes  Who is the provider or what is the name of the office in which the patient attends annual eye exams? Dr Baird [facility]  If pt is not established with a provider, would they like to be referred to a provider to establish care? No .   Dental Screening: Recommended annual dental exams for proper oral hygiene  Community Resource Referral / Chronic Care Management: CRR required this visit?  No   CCM required this visit?  No      Plan:     I have personally reviewed and noted the following in the patient's chart:   Medical and social history Use of alcohol, tobacco or illicit drugs  Current medications and supplements including opioid prescriptions. Patient is not currently taking opioid prescriptions. Functional ability and status Nutritional status Physical activity Advanced directives List of other physicians Hospitalizations, surgeries, and ER visits in previous 12 months Vitals Screenings to include cognitive, depression, and falls Referrals and appointments  In addition, I have reviewed and discussed with patient certain preventive protocols, quality metrics, and best practice recommendations. A written personalized care plan for preventive services as well as general preventive health recommendations were provided to patient.     Willette Brace, LPN   [id number]   Nurse Notes: none

## 2022-10-05 DIAGNOSIS — H353114 Nonexudative age-related macular degeneration, right eye, advanced atrophic with subfoveal involvement: Secondary | ICD-10-CM | POA: Diagnosis not present

## 2022-10-05 DIAGNOSIS — H2513 Age-related nuclear cataract, bilateral: Secondary | ICD-10-CM | POA: Diagnosis not present

## 2022-10-05 DIAGNOSIS — H353212 Exudative age-related macular degeneration, right eye, with inactive choroidal neovascularization: Secondary | ICD-10-CM | POA: Diagnosis not present

## 2022-10-05 DIAGNOSIS — H353221 Exudative age-related macular degeneration, left eye, with active choroidal neovascularization: Secondary | ICD-10-CM | POA: Diagnosis not present

## 2022-10-05 DIAGNOSIS — H353123 Nonexudative age-related macular degeneration, left eye, advanced atrophic without subfoveal involvement: Secondary | ICD-10-CM | POA: Diagnosis not present

## 2022-10-05 DIAGNOSIS — H43813 Vitreous degeneration, bilateral: Secondary | ICD-10-CM | POA: Diagnosis not present

## 2022-10-07 DIAGNOSIS — I447 Left bundle-branch block, unspecified: Secondary | ICD-10-CM | POA: Diagnosis not present

## 2022-10-07 DIAGNOSIS — R011 Cardiac murmur, unspecified: Secondary | ICD-10-CM | POA: Diagnosis not present

## 2022-10-07 DIAGNOSIS — Z794 Long term (current) use of insulin: Secondary | ICD-10-CM | POA: Diagnosis not present

## 2022-10-07 DIAGNOSIS — E782 Mixed hyperlipidemia: Secondary | ICD-10-CM | POA: Diagnosis not present

## 2022-10-07 DIAGNOSIS — I10 Essential (primary) hypertension: Secondary | ICD-10-CM | POA: Diagnosis not present

## 2022-10-07 DIAGNOSIS — E119 Type 2 diabetes mellitus without complications: Secondary | ICD-10-CM | POA: Diagnosis not present

## 2022-10-07 DIAGNOSIS — R002 Palpitations: Secondary | ICD-10-CM | POA: Diagnosis not present

## 2022-10-07 DIAGNOSIS — Z8679 Personal history of other diseases of the circulatory system: Secondary | ICD-10-CM | POA: Diagnosis not present

## 2022-10-18 ENCOUNTER — Other Ambulatory Visit: Payer: Self-pay | Admitting: Family Medicine

## 2022-11-08 DIAGNOSIS — D500 Iron deficiency anemia secondary to blood loss (chronic): Secondary | ICD-10-CM | POA: Diagnosis not present

## 2022-11-08 DIAGNOSIS — R5383 Other fatigue: Secondary | ICD-10-CM | POA: Diagnosis not present

## 2022-11-08 DIAGNOSIS — N9089 Other specified noninflammatory disorders of vulva and perineum: Secondary | ICD-10-CM | POA: Diagnosis not present

## 2022-11-14 DIAGNOSIS — E119 Type 2 diabetes mellitus without complications: Secondary | ICD-10-CM | POA: Diagnosis not present

## 2022-11-14 DIAGNOSIS — Z794 Long term (current) use of insulin: Secondary | ICD-10-CM | POA: Diagnosis not present

## 2022-11-15 DIAGNOSIS — D500 Iron deficiency anemia secondary to blood loss (chronic): Secondary | ICD-10-CM | POA: Diagnosis not present

## 2022-12-02 DIAGNOSIS — H2513 Age-related nuclear cataract, bilateral: Secondary | ICD-10-CM | POA: Diagnosis not present

## 2022-12-02 DIAGNOSIS — H353221 Exudative age-related macular degeneration, left eye, with active choroidal neovascularization: Secondary | ICD-10-CM | POA: Diagnosis not present

## 2022-12-02 DIAGNOSIS — H353212 Exudative age-related macular degeneration, right eye, with inactive choroidal neovascularization: Secondary | ICD-10-CM | POA: Diagnosis not present

## 2022-12-02 DIAGNOSIS — H353123 Nonexudative age-related macular degeneration, left eye, advanced atrophic without subfoveal involvement: Secondary | ICD-10-CM | POA: Diagnosis not present

## 2022-12-02 DIAGNOSIS — H353114 Nonexudative age-related macular degeneration, right eye, advanced atrophic with subfoveal involvement: Secondary | ICD-10-CM | POA: Diagnosis not present

## 2022-12-02 DIAGNOSIS — H43813 Vitreous degeneration, bilateral: Secondary | ICD-10-CM | POA: Diagnosis not present

## 2022-12-08 DIAGNOSIS — R921 Mammographic calcification found on diagnostic imaging of breast: Secondary | ICD-10-CM | POA: Diagnosis not present

## 2022-12-08 LAB — HM MAMMOGRAPHY

## 2022-12-09 ENCOUNTER — Encounter: Payer: Self-pay | Admitting: Family Medicine

## 2022-12-15 ENCOUNTER — Other Ambulatory Visit: Payer: Self-pay | Admitting: Family Medicine

## 2022-12-26 ENCOUNTER — Ambulatory Visit: Payer: Medicare Other | Admitting: Family Medicine

## 2022-12-27 DIAGNOSIS — E1169 Type 2 diabetes mellitus with other specified complication: Secondary | ICD-10-CM | POA: Diagnosis not present

## 2022-12-27 DIAGNOSIS — E782 Mixed hyperlipidemia: Secondary | ICD-10-CM | POA: Diagnosis not present

## 2022-12-27 DIAGNOSIS — Z794 Long term (current) use of insulin: Secondary | ICD-10-CM | POA: Diagnosis not present

## 2022-12-29 ENCOUNTER — Ambulatory Visit (INDEPENDENT_AMBULATORY_CARE_PROVIDER_SITE_OTHER): Payer: Medicare Other | Admitting: Family Medicine

## 2022-12-29 ENCOUNTER — Encounter: Payer: Self-pay | Admitting: Family Medicine

## 2022-12-29 VITALS — BP 119/65 | HR 61 | Temp 97.8°F | Ht 63.0 in | Wt 151.6 lb

## 2022-12-29 DIAGNOSIS — E118 Type 2 diabetes mellitus with unspecified complications: Secondary | ICD-10-CM

## 2022-12-29 DIAGNOSIS — Z794 Long term (current) use of insulin: Secondary | ICD-10-CM

## 2022-12-29 DIAGNOSIS — G4700 Insomnia, unspecified: Secondary | ICD-10-CM

## 2022-12-29 DIAGNOSIS — H3530 Unspecified macular degeneration: Secondary | ICD-10-CM

## 2022-12-29 DIAGNOSIS — D508 Other iron deficiency anemias: Secondary | ICD-10-CM | POA: Diagnosis not present

## 2022-12-29 DIAGNOSIS — I10 Essential (primary) hypertension: Secondary | ICD-10-CM

## 2022-12-29 DIAGNOSIS — I4819 Other persistent atrial fibrillation: Secondary | ICD-10-CM | POA: Diagnosis not present

## 2022-12-29 DIAGNOSIS — Z0000 Encounter for general adult medical examination without abnormal findings: Secondary | ICD-10-CM

## 2022-12-29 DIAGNOSIS — E782 Mixed hyperlipidemia: Secondary | ICD-10-CM

## 2022-12-29 NOTE — Patient Instructions (Addendum)
It was very nice to see you today!  Keep working on diet and exercise!  No changes today.   I am glad that you are doing well.   A personal care assistant (PCA) may be what you are looking for.   Return in about 1 year (around 12/29/2023) for Annual Physical.   Take care, Dr Jimmey Ralph  PLEASE NOTE:  If you had any lab tests, please let us know if you have not heard back within a few days. You may see your results on mychart before we have a chance to review them but we will give you a call once they are reviewed by [name].   If we ordered any referrals today, please let us know if you have not heard from their office within the next week.   If you had any urgent prescriptions sent in today, please check with the pharmacy within an hour of our visit to make sure the prescription was transmitted appropriately.   Please try these tips to maintain a healthy lifestyle:  Eat at least 3 REAL meals and 1-2 snacks per day.  Aim for no more than 5 hours between eating.  If you eat breakfast, please do so within one hour of getting up.   Each meal should contain half fruits/vegetables, one quarter protein, and one quarter carbs (no bigger than a computer mouse)  Cut down on sweet beverages. This includes juice, soda, and sweet tea.   Drink at least 1 glass of water with each meal and aim for at least 8 glasses per day  Exercise at least 150 minutes every week.    Preventive Care 66 Years and Older, Female Preventive care refers to lifestyle choices and visits with your health care provider that can promote health and wellness. Preventive care visits are also called wellness exams. What can I expect for my preventive care visit? Counseling Your health care provider may ask you questions about your: Medical history, including: Past medical problems. Family medical history. Pregnancy and menstrual history. History of falls. Current health, including: Memory and ability to understand  (cognition). Emotional well-being. Home life and relationship well-being. Sexual activity and sexual health. Lifestyle, including: Alcohol, nicotine or tobacco, and drug use. Access to firearms. Diet, exercise, and sleep habits. Work and work Astronomer. Sunscreen use. Safety issues such as seatbelt and bike helmet use. Physical exam Your health care provider will check your: Height and weight. These may be used to calculate your BMI (body mass index). BMI is a measurement that tells if you are at a healthy weight. Waist circumference. This measures the distance around your waistline. This measurement also tells if you are at a healthy weight and may help predict your risk of certain diseases, such as type 2 diabetes and high blood pressure. Heart rate and blood pressure. Body temperature. Skin for abnormal spots. What immunizations do I need?  Vaccines are usually given at various ages, according to a schedule. Your health care provider will recommend vaccines for you based on your age, medical history, and lifestyle or other factors, such as travel or where you work. What tests do I need? Screening Your health care provider may recommend screening tests for certain conditions. This may include: Lipid and cholesterol levels. Hepatitis C test. Hepatitis B test. HIV (human immunodeficiency virus) test. STI (sexually transmitted infection) testing, if you are at risk. Lung cancer screening. Colorectal cancer screening. Diabetes screening. This is done by checking your blood sugar (glucose) after you have not eaten  for a while (fasting). Mammogram. Talk with your health care provider about how often you should have regular mammograms. BRCA-related cancer screening. This may be done if you have a family history of breast, ovarian, tubal, or peritoneal cancers. Bone density scan. This is done to screen for osteoporosis. Talk with your health care provider about your test results,  treatment options, and if necessary, the need for more tests. Follow these instructions at home: Eating and drinking  Eat a diet that includes fresh fruits and vegetables, whole grains, lean protein, and low-fat dairy products. Limit your intake of foods with high amounts of sugar, saturated fats, and salt. Take vitamin and mineral supplements as recommended by your health care provider. Do not drink alcohol if your health care provider tells you not to drink. If you drink alcohol: Limit how much you have to 0-1 drink a day. Know how much alcohol is in your drink. In the [place] one drink equals one 12 oz bottle of beer (355 mL), one 5 oz glass of wine (148 mL), or one 1 oz glass of hard liquor (44 mL). Lifestyle Brush your teeth every morning and night with fluoride toothpaste. Floss one time each day. Exercise for at least 30 minutes 5 or more days each week. Do not use any products that contain nicotine or tobacco. These products include cigarettes, chewing tobacco, and vaping devices, such as e-cigarettes. If you need help quitting, ask your health care provider. Do not use drugs. If you are sexually active, practice safe sex. Use a condom or other form of protection in order to prevent STIs. Take aspirin only as told by your health care provider. Make sure that you understand how much to take and what form to take. Work with your health care provider to find out whether it is safe and beneficial for you to take aspirin daily. Ask your health care provider if you need to take a cholesterol-lowering medicine (statin). Find healthy ways to manage stress, such as: Meditation, yoga, or listening to music. Journaling. Talking to a trusted person. Spending time with friends and family. Minimize exposure to UV radiation to reduce your risk of skin cancer. Safety Always wear your seat belt while driving or riding in a vehicle. Do not drive: If you have been drinking alcohol. Do not ride with  someone who has been drinking. When you are tired or distracted. While texting. If you have been using any mind-altering substances or drugs. Wear a helmet and other protective equipment during sports activities. If you have firearms in your house, make sure you follow all gun safety procedures. What's next? Visit your health care provider once a year for an annual wellness visit. Ask your health care provider how often you should have your eyes and teeth checked. Stay up to date on all vaccines. This information is not intended to replace advice given to you by your health care provider. Make sure you discuss any questions you have with your health care provider. Document Revised: 12/30/2020 Document Reviewed: 12/30/2020 Elsevier Patient Education  2024 [employer].

## 2022-12-29 NOTE — Assessment & Plan Note (Signed)
Doing much better.  She is no longer on clonazepam.  She was on this for few months however was able to wean off of this.  Insomnia is well-controlled without any medications.

## 2022-12-29 NOTE — Assessment & Plan Note (Signed)
Continue management per ophthalmology.  Gets injections every 4 to 6 weeks.

## 2022-12-29 NOTE — Assessment & Plan Note (Signed)
Following with cardiology.  Rate controlled on metoprolol and flecainide.  Axnticoagulated on Xarelto.

## 2022-12-29 NOTE — Assessment & Plan Note (Signed)
Follows with endocrinology.  Last A1c at goal. 

## 2022-12-29 NOTE — Assessment & Plan Note (Signed)
Follows with heme-onc for this.  Will be following up again soon.  Doing well status post recent iron infusion.

## 2022-12-29 NOTE — Assessment & Plan Note (Signed)
Follows with cardiology.  Stable on Norvasc 10 mg daily, ramipril 20 mg daily, chlorthalidone 25 mg daily, Toprol tartrate 50 mg twice daily, and hydralazine 25 mg 3 times daily as needed.

## 2022-12-29 NOTE — Assessment & Plan Note (Signed)
Follows with cardiology and endocrinology.  Lipids are at goal on Crestor 20 mg daily and fenofibrate 160 mg daily.

## 2022-12-29 NOTE — Progress Notes (Signed)
Isabella Wright is a 77 y.o. female who presents today for an office visit.  Assessment/Plan:  Chronic Problems Addressed Today: Essential hypertension Follows with cardiology.  Stable on Norvasc 10 mg daily, ramipril 20 mg daily, chlorthalidone 25 mg daily, Toprol tartrate 50 mg twice daily, and hydralazine 25 mg 3 times daily as needed.  Hyperlipemia, mixed Follows with cardiology and endocrinology.  Lipids are at goal on Crestor 20 mg daily and fenofibrate 160 mg daily.  Diabetes mellitus type 2, controlled (HCC) Follows with endocrinology.  Last A1c at goal.  Iron deficiency anemia Follows with heme-onc for this.  Will be following up again soon.  Doing well status post recent iron infusion.  Persistent atrial fibrillation (HCC) - s/p Ablation, miantaining NSR, CHA2DS2VASc 4 (on Xarelto) Following with cardiology.  Rate controlled on metoprolol and flecainide.  Axnticoagulated on Xarelto.  Insomnia Doing much better.  She is no longer on clonazepam.  She was on this for few months however was able to wean off of this.  Insomnia is well-controlled without any medications.  Macular degeneration Continue management per ophthalmology.  Gets injections every 4 to 6 weeks.  Preventative health care Defer checking labs today as she is getting this done via cardiology, endocrinology, and hematology.  Her diabetic maintenance is done via endocrinology.She no longer needs colonoscopy due to age.    Subjective:  HPI:  See Assessment / plan for status of chronic conditions. Patient is here today for annual follow-up.  She did have to get an iron infusion a couple of months ago.  Feels like she is doing better.  Will follow-up with heme-onc again soon.  She has been consistent with her appointments with ophthalmology, endocrinology, and cardiology since her last visit.  No acute concerns today.  ROS: Per HPI, otherwise a complete review of systems was negative.   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   Diabetes mellitus type 2, controlled (HCC) 2012   GERD (gastroesophageal reflux disease)    History of hiatal hernia    Hypertension 2000   2009: Renal Dopplers showed R RA ostial stenosis -- (told not a problem or 20 yrs)   Hypertriglyceridemia 1996   Very labile and difficult control: Began in '96 when she began taking Premarin after hysterectomy -- triglycerides increased to 3582 neuropathy in her feet> care for by endocrinologis; levels vary based on stress.;; Labs from March 2015: TC 150, TG 745, HDL 26, LDL 45   Iron deficiency anemia 04/30/2019   Kidney stones    LBBB (left bundle branch block) 2009   Initially diagnosed as rate related during stress echo.   Macular degeneration    "dry in right; treating like wet in the left" (03/15/2016)   Microcytic anemia 04/26/2019   Paroxysmal SVT (supraventricular tachycardia) 2009   less frequent following I&D of chest wall abscess (Jan 015), not previously documented, may have been afib.   Personal history of atrial fibrillation 01/04/2016   a. 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 ->  No evidence of acute cerebral infarct.  Brain MRI shows abnormal appearance  of distal left vertebral artery flow.   Vertebral artery occlusion, left    Noted on MRI/A of the neck in April 2018 --left vertebral artery occluded at origin and reconstitutes at level of V3.  This is similar to what was found on carotid Dopplers. -->  ASYMPTOMATIC   Patient Active Problem List   Diagnosis Date Noted   Macular degeneration 12/29/2022   Allergic rhinitis 08/30/2021   Iron  deficiency anemia 04/30/2019   Microcytic anemia 04/26/2019   Pruritus 04/22/2019   Insomnia 04/22/2019   Vasomotor symptoms due to menopause 04/22/2019   Gastroesophageal reflux disease 04/22/2019   Hyperlipemia, mixed 03/06/2017   Vertebral artery obstruction, left 10/29/2016   Persistent atrial fibrillation (HCC) - s/p Ablation, miantaining NSR, CHA2DS2VASc 4 (on Xarelto) 03/15/2016   Essential hypertension 03/19/2014   Encounter for long-term (current) use of other medications 12/03/2013   Renal artery stenosis (HCC) 12/03/2013   Mixed hyperlipidemia: High triglycerides, low HDL 12/03/2013   LBBB (left bundle branch block) 12/03/2013   SVT (supraventricular tachycardia) (HCC) 12/03/2013   Diabetes mellitus type 2, controlled (HCC) 07/18/2010   Past Surgical History:  Procedure Laterality Date   Abdominal Doppler ultrasound  February 2009    Right ostial renal artery stenosis; normal renal structure.   APPENDECTOMY  1968   CARDIAC ELECTROPHYSIOLOGY MAPPING AND ABLATION  2017   CYSTOSCOPY W/ URETEROSCOPY W/ LITHOTRIPSY   1999   Large calcium oxalate stone   CYSTOSCOPY/RETROGRADE/URETEROSCOPY/STONE EXTRACTION WITH BASKET  1972   ELECTROPHYSIOLOGIC STUDY N/A 05/13/2016   Procedure: Atrial Fibrillation Ablation;  Surgeon: Hillis Range, MD;  Location[facility] INVASIVE CV LAB;  Service: Cardiovascular;  Laterality: N/A;   EXCISIONAL HEMORRHOIDECTOMY   1981   FRACTURE SURGERY     IRRIGATION AND DEBRIDEMENT SEBACEOUS CYST  January 2015    chest wall; with antibiotics --> following this treatment, SVT episodes became much less frequent.   NM MYOVIEW LTD  12/2015   LOW RISK.  No ischemia or infarction.  Small septal-anterior defect likely related to left bundle branch block.   ORIF PROXIMAL TIBIAL PLATEAU FRACTURE Left 2007   , Related shattered left tibial plateau with reattachment of torn ligaments and insertion of titanium plate and screws.   RENAL ARTERY DUPLEX  01/02/2014   R&L RA -  mildly elevated velocities - < 60%; Bilateral Kidneys - normal shape & size.   SARCOMA EXCISION Left 2015   dermatofibro sarcoma protuberens, wide excision of groin from front side to buttocks"   SOFT TISSUE BIOPSY Left 2015   TOTAL ABDOMINAL HYSTERECTOMY  1994   TRANSTHORACIC ECHOCARDIOGRAM  12/2015   Normal LV size and function.  EF 55%.  Mild LVH.  Septal-lateral dyssynchrony (from LBBB).  No significant valvular abnormalities.   TREADMILL STRESS ECHO  01/2009   No regional wall motion abnormalities with stress == non-ischemic; rate related incomplete left bundle branch block    Family History  Problem Relation Age of Onset   Heart disease Brother        Died at 50   Heart disease Mother        Died at 15   Heart disease Father        Died at 21   Breast cancer Sister        Died at 55    Medications- reviewed and updated Current Outpatient Medications  Medication Sig Dispense Refill   ACCU-CHEK AVIVA PLUS test strip 1 EACH BY OTHER ROUTE 4 (FOUR) TIMES DAILY. USE AS INSTRUCTED DIAG  E11.65  5   ACCU-CHEK SOFTCLIX LANCETS lancets 1 EACH BY OTHER ROUTE 3 (THREE) TIMES A DAY.  5   acetaminophen (TYLENOL) 325 MG tablet Take 2 tablets (650 mg total) by mouth every 4 (four) hours as needed for headache or mild pain.     amLODipine (NORVASC) 10 MG tablet Take 10 mg by mouth daily.     azelastine (ASTELIN) 0.1 % nasal spray Place 2 sprays into both nostrils 2 (two) times daily. 30 mL 12   B-D UF III MINI PEN NEEDLES 31G X 5 MM MISC Inject into the skin daily.     chlorthalidone (HYGROTON) 25 MG tablet Take by mouth.     cholecalciferol (VITAMIN D) 1000 UNITS tablet Take 2,000 Units by mouth daily.      clobetasol (TEMOVATE) 0.05 % external solution Apply topically.     Continuous Blood Gluc Sensor (FREESTYLE LIBRE SENSOR SYSTEM) MISC 1 EACH BY DOES NOT APPLY ROUTE EVERY 10 DAYS.  99   fenofibrate 160 MG tablet TAKE 1 TABLET BY MOUTH EVERY DAY FOR HIGH TRIGLYCERIDES 90 tablet 1    flecainide (TAMBOCOR) 50 MG tablet Take 50 mg by mouth 2 (two) times daily.     fluticasone (FLONASE) 50 MCG/ACT nasal spray Place 1-2 sprays into both nostrils at bedtime as needed for allergies or rhinitis.     hydrALAZINE (APRESOLINE) 25 MG tablet Take 1 tablet (25 mg total) by mouth 3 (three) times daily as needed. For BP > 180/120 90 tablet 3   hydrocortisone (ANUSOL-HC) 25 MG suppository Place 1 suppository (25 mg total) rectally 2 (two) times daily. 12 suppository 0   Influenza vac split quadrivalent PF (FLUZONE HIGH-DOSE) 0.5 ML injection TO BE ADMINISTERED BY PHARMACIST FOR IMMUNIZATION     insulin aspart (FIASP FLEXTOUCH) 100 UNIT/ML FlexTouch Pen Inject 30 units into the skin before breakfast, 30 units before lunch, and 40 units before dinner. (Total daily dose = 100 units.)     Insulin Pen Needle 32G X 6 MM MISC SMARTSIG:Injection 3 Times Daily     ketoconazole (NIZORAL) 2 % shampoo      loratadine (CLARITIN) 10 MG tablet Take 1 tablet (10 mg total) by mouth daily. 30 tablet 11   metFORMIN (GLUCOPHAGE-XR) 500 MG 24 hr tablet Take 1 tablet (500 mg total) by mouth daily with breakfast. 1000 MG TAKE IN THE EVENING     metoprolol tartrate (LOPRESSOR) 50 MG tablet Take 1 tablet by mouth 2 (two) times daily.     Multiple Vitamins-Minerals (PRESERVISION AREDS 2 PO) Take 1 tablet by mouth 2 (two) times daily.      neomycin-polymyxin b-dexamethasone (MAXITROL) 3.5-10000-0.1 OINT Place 1 application  into the left eye 2 (two) times daily.     Omega-3 Fatty Acids (OMEGA-3 FISH OIL PO) Take by mouth.     omeprazole (PRILOSEC) 20 MG capsule TAKE 1 CAPSULE BY MOUTH EVERY DAY 90 capsule 2   pseudoephedrine (SUDAFED 12 HOUR) 120 MG 12 hr tablet Take 1 tablet (120 mg total) by mouth 2 (two) times daily. 20 tablet 0   ramipril (ALTACE) 10 MG capsule Take 2 capsules (20 mg total) by mouth daily. Please make overdue appt with Cardiologist before anymore refills. Thank you 1st attempt 60 capsule 0    rosuvastatin (CRESTOR) 20 MG tablet Take 1 tablet by mouth daily.     Saline (SIMPLY SALINE) 0.9 % AERS Place 2 each into the nose daily as needed. 500 mL 1   tobramycin (TOBREX) 0.3 %  ophthalmic solution Place into the left eye.     TRESIBA FLEXTOUCH 200 UNIT/ML FlexTouch Pen SMARTSIG:60 Unit(s) SUB-Q Daily     triamcinolone (KENALOG) 0.1 % Apply 1 application  topically 2 (two) times daily as needed.     TRULICITY 0.75 MG/0.5ML SOPN Inject into the skin.     XARELTO 20 MG TABS tablet TAKE 1 TABLET BY MOUTH EVERY DAY WITH SUPPER 90 tablet 1   No current facility-administered medications for this visit.    Allergies-reviewed and updated Allergies  Allergen Reactions   Fluorescein Anaphylaxis    Pt states she had swelling of facial tissue with SOB and itching with a rapid heart rate.   Premarin  [Conjugated Estrogens] Other (See Comments)   Demerol [Meperidine] Nausea And Vomiting   Exenatide Nausea And Vomiting   Liraglutide Nausea And Vomiting   Covid-19 Ad26 Vaccine(Janssen)     Social History   Socioeconomic History   Marital status: Widowed    Spouse name: Not on file   Number of children: Not on file   Years of education: Not on file   Highest education level: Not on file  Occupational History   Occupation: retired  Tobacco Use   Smoking status: Former    Packs/day: 0.50    Years: 15.00    Additional pack years: 0.00    Total pack years: 7.50    Types: Cigarettes   Smokeless tobacco: Never   Tobacco comments:    "smoked 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 She is now widowed for just under 2 years. Her husband died of cancer. They do not have any children.   She  recently (spring of 2015) moved to [place] to be closer to her brother, Harden Mo and his family.   She been the caregiver for her ailing husband for 5 years. He passed away in 08-30-13and she has had prolonged period of grieving partly due to her having lost 3 members of her immediate family during that time period as well.  --- She is under a lot of stress      She is a former smoker quit years ago. She does not drink alcohol. She is active, but not routine exercise. She says that she tries to eat healthy and knows what usually affects her triglyceride levels.   Social Determinants of Health   Financial Resource Strain: Low Risk  (12/26/2022)   Overall Financial Resource Strain (CARDIA)    Difficulty of Paying Living Expenses: Not hard at all  Food Insecurity: No Food Insecurity (12/26/2022)   Hunger Vital Sign    Worried About Running Out of Food in the Last Year: Never true    Ran Out of Food in the Last Year: Never true  Transportation Needs: No Transportation Needs (09/27/2022)   PRAPARE - Administrator, Civil Service (Medical): No    Lack of Transportation (Non-Medical): No  Physical Activity: Unknown (12/26/2022)   Exercise Vital Sign    Days of Exercise per Week: 3 days    Minutes of Exercise per Session: Patient declined  Stress: No Stress Concern Present (12/26/2022)  [employer] of Occupational Health - Occupational Stress Questionnaire    Feeling of Stress : Not at all  Social Connections: Unknown (12/26/2022)   Social Connection and Isolation Panel [NHANES]    Frequency of Communication with Friends and Family: Patient declined    Frequency of Social Gatherings with Friends and Family: Patient declined    Attends Religious Services: Patient declined    Database administrator or Organizations: Patient declined    Attends Banker Meetings: 1 to 4 times per year    Marital Status: Widowed          Objective:  Physical Exam: BP  119/65   Pulse 61   Temp 97.8 F (36.6 C) (Temporal)   Ht 5\' 3"  (1.6 m)   Wt 151 lb 9.6 oz (68.8 kg)   SpO2 96%   BMI 26.85 kg/m   Gen: No acute distress, resting comfortably CV: Regular rate and rhythm with no murmurs appreciated Pulm: Normal work of breathing, clear to auscultation bilaterally with no crackles, wheezes, or rhonchi Neuro: Grossly normal, moves all extremities Psych: Normal affect and thought content      Jozee Hammer M. Jimmey Ralph, MD 12/29/2022 11:43 AM

## 2023-01-03 DIAGNOSIS — D500 Iron deficiency anemia secondary to blood loss (chronic): Secondary | ICD-10-CM | POA: Diagnosis not present

## 2023-01-03 DIAGNOSIS — D509 Iron deficiency anemia, unspecified: Secondary | ICD-10-CM | POA: Diagnosis not present

## 2023-01-03 DIAGNOSIS — I4819 Other persistent atrial fibrillation: Secondary | ICD-10-CM | POA: Diagnosis not present

## 2023-01-16 ENCOUNTER — Other Ambulatory Visit: Payer: Self-pay | Admitting: Family Medicine

## 2023-02-06 DIAGNOSIS — H43813 Vitreous degeneration, bilateral: Secondary | ICD-10-CM | POA: Diagnosis not present

## 2023-02-06 DIAGNOSIS — H353221 Exudative age-related macular degeneration, left eye, with active choroidal neovascularization: Secondary | ICD-10-CM | POA: Diagnosis not present

## 2023-03-27 DIAGNOSIS — L812 Freckles: Secondary | ICD-10-CM | POA: Diagnosis not present

## 2023-03-27 DIAGNOSIS — D225 Melanocytic nevi of trunk: Secondary | ICD-10-CM | POA: Diagnosis not present

## 2023-03-27 DIAGNOSIS — L821 Other seborrheic keratosis: Secondary | ICD-10-CM | POA: Diagnosis not present

## 2023-03-27 DIAGNOSIS — L820 Inflamed seborrheic keratosis: Secondary | ICD-10-CM | POA: Diagnosis not present

## 2023-03-27 DIAGNOSIS — L300 Nummular dermatitis: Secondary | ICD-10-CM | POA: Diagnosis not present

## 2023-03-28 DIAGNOSIS — H02834 Dermatochalasis of left upper eyelid: Secondary | ICD-10-CM | POA: Diagnosis not present

## 2023-03-28 DIAGNOSIS — H57813 Brow ptosis, bilateral: Secondary | ICD-10-CM | POA: Diagnosis not present

## 2023-03-28 DIAGNOSIS — H02831 Dermatochalasis of right upper eyelid: Secondary | ICD-10-CM | POA: Diagnosis not present

## 2023-03-29 DIAGNOSIS — Z794 Long term (current) use of insulin: Secondary | ICD-10-CM | POA: Diagnosis not present

## 2023-03-29 DIAGNOSIS — I4819 Other persistent atrial fibrillation: Secondary | ICD-10-CM | POA: Diagnosis not present

## 2023-03-29 DIAGNOSIS — E119 Type 2 diabetes mellitus without complications: Secondary | ICD-10-CM | POA: Diagnosis not present

## 2023-03-29 DIAGNOSIS — I10 Essential (primary) hypertension: Secondary | ICD-10-CM | POA: Diagnosis not present

## 2023-03-29 DIAGNOSIS — D500 Iron deficiency anemia secondary to blood loss (chronic): Secondary | ICD-10-CM | POA: Diagnosis not present

## 2023-03-29 DIAGNOSIS — E559 Vitamin D deficiency, unspecified: Secondary | ICD-10-CM | POA: Diagnosis not present

## 2023-03-29 DIAGNOSIS — D751 Secondary polycythemia: Secondary | ICD-10-CM | POA: Diagnosis not present

## 2023-03-29 DIAGNOSIS — C44799 Other specified malignant neoplasm of skin of left lower limb, including hip: Secondary | ICD-10-CM | POA: Diagnosis not present

## 2023-03-29 DIAGNOSIS — R160 Hepatomegaly, not elsewhere classified: Secondary | ICD-10-CM | POA: Diagnosis not present

## 2023-04-03 DIAGNOSIS — E782 Mixed hyperlipidemia: Secondary | ICD-10-CM | POA: Diagnosis not present

## 2023-04-03 DIAGNOSIS — E119 Type 2 diabetes mellitus without complications: Secondary | ICD-10-CM | POA: Diagnosis not present

## 2023-04-03 DIAGNOSIS — E559 Vitamin D deficiency, unspecified: Secondary | ICD-10-CM | POA: Diagnosis not present

## 2023-04-03 DIAGNOSIS — E1169 Type 2 diabetes mellitus with other specified complication: Secondary | ICD-10-CM | POA: Diagnosis not present

## 2023-04-03 DIAGNOSIS — Z794 Long term (current) use of insulin: Secondary | ICD-10-CM | POA: Diagnosis not present

## 2023-04-10 DIAGNOSIS — I10 Essential (primary) hypertension: Secondary | ICD-10-CM | POA: Diagnosis not present

## 2023-04-10 DIAGNOSIS — Z794 Long term (current) use of insulin: Secondary | ICD-10-CM | POA: Diagnosis not present

## 2023-04-10 DIAGNOSIS — R011 Cardiac murmur, unspecified: Secondary | ICD-10-CM | POA: Diagnosis not present

## 2023-04-10 DIAGNOSIS — R002 Palpitations: Secondary | ICD-10-CM | POA: Diagnosis not present

## 2023-04-10 DIAGNOSIS — E782 Mixed hyperlipidemia: Secondary | ICD-10-CM | POA: Diagnosis not present

## 2023-04-10 DIAGNOSIS — I447 Left bundle-branch block, unspecified: Secondary | ICD-10-CM | POA: Diagnosis not present

## 2023-04-10 DIAGNOSIS — E119 Type 2 diabetes mellitus without complications: Secondary | ICD-10-CM | POA: Diagnosis not present

## 2023-04-10 DIAGNOSIS — Z8679 Personal history of other diseases of the circulatory system: Secondary | ICD-10-CM | POA: Diagnosis not present

## 2023-04-12 DIAGNOSIS — K760 Fatty (change of) liver, not elsewhere classified: Secondary | ICD-10-CM | POA: Diagnosis not present

## 2023-04-12 DIAGNOSIS — N289 Disorder of kidney and ureter, unspecified: Secondary | ICD-10-CM | POA: Diagnosis not present

## 2023-04-12 DIAGNOSIS — R160 Hepatomegaly, not elsewhere classified: Secondary | ICD-10-CM | POA: Diagnosis not present

## 2023-04-13 ENCOUNTER — Other Ambulatory Visit: Payer: Self-pay | Admitting: *Deleted

## 2023-04-13 MED ORDER — FENOFIBRATE 160 MG PO TABS: ORAL_TABLET | ORAL | 1 refills | Status: DC

## 2023-04-13 MED ORDER — OMEPRAZOLE 20 MG PO CPDR: 20.0000 mg | DELAYED_RELEASE_CAPSULE | Freq: Every day | ORAL | 1 refills | Status: DC

## 2023-04-18 DIAGNOSIS — H2513 Age-related nuclear cataract, bilateral: Secondary | ICD-10-CM | POA: Diagnosis not present

## 2023-04-18 DIAGNOSIS — H353212 Exudative age-related macular degeneration, right eye, with inactive choroidal neovascularization: Secondary | ICD-10-CM | POA: Diagnosis not present

## 2023-04-18 DIAGNOSIS — H353114 Nonexudative age-related macular degeneration, right eye, advanced atrophic with subfoveal involvement: Secondary | ICD-10-CM | POA: Diagnosis not present

## 2023-04-18 DIAGNOSIS — H353221 Exudative age-related macular degeneration, left eye, with active choroidal neovascularization: Secondary | ICD-10-CM | POA: Diagnosis not present

## 2023-04-18 DIAGNOSIS — H353123 Nonexudative age-related macular degeneration, left eye, advanced atrophic without subfoveal involvement: Secondary | ICD-10-CM | POA: Diagnosis not present

## 2023-04-18 DIAGNOSIS — H43813 Vitreous degeneration, bilateral: Secondary | ICD-10-CM | POA: Diagnosis not present

## 2023-04-25 DIAGNOSIS — I447 Left bundle-branch block, unspecified: Secondary | ICD-10-CM | POA: Diagnosis not present

## 2023-04-25 DIAGNOSIS — Z8679 Personal history of other diseases of the circulatory system: Secondary | ICD-10-CM | POA: Diagnosis not present

## 2023-05-09 ENCOUNTER — Ambulatory Visit (INDEPENDENT_AMBULATORY_CARE_PROVIDER_SITE_OTHER): Payer: Medicare Other

## 2023-05-09 DIAGNOSIS — Z23 Encounter for immunization: Secondary | ICD-10-CM

## 2023-05-15 DIAGNOSIS — I482 Chronic atrial fibrillation, unspecified: Secondary | ICD-10-CM | POA: Diagnosis not present

## 2023-05-15 DIAGNOSIS — H02831 Dermatochalasis of right upper eyelid: Secondary | ICD-10-CM | POA: Diagnosis not present

## 2023-05-15 DIAGNOSIS — Z7901 Long term (current) use of anticoagulants: Secondary | ICD-10-CM | POA: Diagnosis not present

## 2023-05-15 DIAGNOSIS — H02834 Dermatochalasis of left upper eyelid: Secondary | ICD-10-CM | POA: Diagnosis not present

## 2023-05-15 DIAGNOSIS — H57813 Brow ptosis, bilateral: Secondary | ICD-10-CM | POA: Diagnosis not present

## 2023-05-15 DIAGNOSIS — Z8639 Personal history of other endocrine, nutritional and metabolic disease: Secondary | ICD-10-CM | POA: Diagnosis not present

## 2023-05-15 DIAGNOSIS — H3530 Unspecified macular degeneration: Secondary | ICD-10-CM | POA: Diagnosis not present

## 2023-05-24 DIAGNOSIS — H353123 Nonexudative age-related macular degeneration, left eye, advanced atrophic without subfoveal involvement: Secondary | ICD-10-CM | POA: Diagnosis not present

## 2023-05-24 DIAGNOSIS — H353114 Nonexudative age-related macular degeneration, right eye, advanced atrophic with subfoveal involvement: Secondary | ICD-10-CM | POA: Diagnosis not present

## 2023-05-30 DIAGNOSIS — R002 Palpitations: Secondary | ICD-10-CM | POA: Diagnosis not present

## 2023-05-30 DIAGNOSIS — E119 Type 2 diabetes mellitus without complications: Secondary | ICD-10-CM | POA: Diagnosis not present

## 2023-05-30 DIAGNOSIS — Z8679 Personal history of other diseases of the circulatory system: Secondary | ICD-10-CM | POA: Diagnosis not present

## 2023-05-30 DIAGNOSIS — I447 Left bundle-branch block, unspecified: Secondary | ICD-10-CM | POA: Diagnosis not present

## 2023-05-30 DIAGNOSIS — E782 Mixed hyperlipidemia: Secondary | ICD-10-CM | POA: Diagnosis not present

## 2023-05-30 DIAGNOSIS — Z794 Long term (current) use of insulin: Secondary | ICD-10-CM | POA: Diagnosis not present

## 2023-05-30 DIAGNOSIS — I10 Essential (primary) hypertension: Secondary | ICD-10-CM | POA: Diagnosis not present

## 2023-06-12 DIAGNOSIS — R921 Mammographic calcification found on diagnostic imaging of breast: Secondary | ICD-10-CM | POA: Diagnosis not present

## 2023-06-23 ENCOUNTER — Other Ambulatory Visit: Payer: Self-pay | Admitting: Family Medicine

## 2023-06-28 DIAGNOSIS — H2513 Age-related nuclear cataract, bilateral: Secondary | ICD-10-CM | POA: Diagnosis not present

## 2023-06-28 DIAGNOSIS — H353212 Exudative age-related macular degeneration, right eye, with inactive choroidal neovascularization: Secondary | ICD-10-CM | POA: Diagnosis not present

## 2023-06-28 DIAGNOSIS — H353114 Nonexudative age-related macular degeneration, right eye, advanced atrophic with subfoveal involvement: Secondary | ICD-10-CM | POA: Diagnosis not present

## 2023-06-28 DIAGNOSIS — H43813 Vitreous degeneration, bilateral: Secondary | ICD-10-CM | POA: Diagnosis not present

## 2023-06-28 DIAGNOSIS — H353221 Exudative age-related macular degeneration, left eye, with active choroidal neovascularization: Secondary | ICD-10-CM | POA: Diagnosis not present

## 2023-06-28 DIAGNOSIS — H353123 Nonexudative age-related macular degeneration, left eye, advanced atrophic without subfoveal involvement: Secondary | ICD-10-CM | POA: Diagnosis not present

## 2023-07-01 ENCOUNTER — Other Ambulatory Visit: Payer: Self-pay | Admitting: Family Medicine

## 2023-07-03 ENCOUNTER — Other Ambulatory Visit: Payer: Self-pay | Admitting: Family Medicine

## 2023-07-04 DIAGNOSIS — E119 Type 2 diabetes mellitus without complications: Secondary | ICD-10-CM | POA: Diagnosis not present

## 2023-07-04 DIAGNOSIS — E1169 Type 2 diabetes mellitus with other specified complication: Secondary | ICD-10-CM | POA: Diagnosis not present

## 2023-07-04 DIAGNOSIS — E785 Hyperlipidemia, unspecified: Secondary | ICD-10-CM | POA: Diagnosis not present

## 2023-07-04 DIAGNOSIS — Z794 Long term (current) use of insulin: Secondary | ICD-10-CM | POA: Diagnosis not present

## 2023-07-04 DIAGNOSIS — E559 Vitamin D deficiency, unspecified: Secondary | ICD-10-CM | POA: Diagnosis not present

## 2023-08-02 DIAGNOSIS — H353114 Nonexudative age-related macular degeneration, right eye, advanced atrophic with subfoveal involvement: Secondary | ICD-10-CM | POA: Diagnosis not present

## 2023-08-02 DIAGNOSIS — H353123 Nonexudative age-related macular degeneration, left eye, advanced atrophic without subfoveal involvement: Secondary | ICD-10-CM | POA: Diagnosis not present

## 2023-08-02 DIAGNOSIS — H53413 Scotoma involving central area, bilateral: Secondary | ICD-10-CM | POA: Diagnosis not present

## 2023-08-08 DIAGNOSIS — E119 Type 2 diabetes mellitus without complications: Secondary | ICD-10-CM | POA: Diagnosis not present

## 2023-08-10 DIAGNOSIS — H53413 Scotoma involving central area, bilateral: Secondary | ICD-10-CM | POA: Diagnosis not present

## 2023-08-15 DIAGNOSIS — H53413 Scotoma involving central area, bilateral: Secondary | ICD-10-CM | POA: Diagnosis not present

## 2023-08-22 DIAGNOSIS — H53413 Scotoma involving central area, bilateral: Secondary | ICD-10-CM | POA: Diagnosis not present

## 2023-08-29 DIAGNOSIS — H53413 Scotoma involving central area, bilateral: Secondary | ICD-10-CM | POA: Diagnosis not present

## 2023-09-05 DIAGNOSIS — H53413 Scotoma involving central area, bilateral: Secondary | ICD-10-CM | POA: Diagnosis not present

## 2023-09-11 DIAGNOSIS — H53413 Scotoma involving central area, bilateral: Secondary | ICD-10-CM | POA: Diagnosis not present

## 2023-09-18 DIAGNOSIS — H353221 Exudative age-related macular degeneration, left eye, with active choroidal neovascularization: Secondary | ICD-10-CM | POA: Diagnosis not present

## 2023-09-18 DIAGNOSIS — H43813 Vitreous degeneration, bilateral: Secondary | ICD-10-CM | POA: Diagnosis not present

## 2023-09-18 DIAGNOSIS — H353123 Nonexudative age-related macular degeneration, left eye, advanced atrophic without subfoveal involvement: Secondary | ICD-10-CM | POA: Diagnosis not present

## 2023-09-18 DIAGNOSIS — H353212 Exudative age-related macular degeneration, right eye, with inactive choroidal neovascularization: Secondary | ICD-10-CM | POA: Diagnosis not present

## 2023-09-18 DIAGNOSIS — H2513 Age-related nuclear cataract, bilateral: Secondary | ICD-10-CM | POA: Diagnosis not present

## 2023-09-18 DIAGNOSIS — H353114 Nonexudative age-related macular degeneration, right eye, advanced atrophic with subfoveal involvement: Secondary | ICD-10-CM | POA: Diagnosis not present

## 2023-09-22 DIAGNOSIS — H53413 Scotoma involving central area, bilateral: Secondary | ICD-10-CM | POA: Diagnosis not present

## 2023-09-24 ENCOUNTER — Emergency Department (HOSPITAL_BASED_OUTPATIENT_CLINIC_OR_DEPARTMENT_OTHER)

## 2023-09-24 ENCOUNTER — Other Ambulatory Visit: Payer: Self-pay

## 2023-09-24 ENCOUNTER — Emergency Department (HOSPITAL_BASED_OUTPATIENT_CLINIC_OR_DEPARTMENT_OTHER)
Admission: EM | Admit: 2023-09-24 | Discharge: 2023-09-24 | Disposition: A | Discharge status: Home / Self Care | Attending: Emergency Medicine | Admitting: Emergency Medicine

## 2023-09-24 ENCOUNTER — Encounter (HOSPITAL_BASED_OUTPATIENT_CLINIC_OR_DEPARTMENT_OTHER): Payer: Self-pay

## 2023-09-24 DIAGNOSIS — M7981 Nontraumatic hematoma of soft tissue: Secondary | ICD-10-CM | POA: Insufficient documentation

## 2023-09-24 DIAGNOSIS — S5011XA Contusion of right forearm, initial encounter: Secondary | ICD-10-CM | POA: Diagnosis not present

## 2023-09-24 DIAGNOSIS — E119 Type 2 diabetes mellitus without complications: Secondary | ICD-10-CM | POA: Diagnosis not present

## 2023-09-24 DIAGNOSIS — M85831 Other specified disorders of bone density and structure, right forearm: Secondary | ICD-10-CM | POA: Diagnosis not present

## 2023-09-24 DIAGNOSIS — M7989 Other specified soft tissue disorders: Secondary | ICD-10-CM | POA: Diagnosis not present

## 2023-09-24 DIAGNOSIS — R609 Edema, unspecified: Secondary | ICD-10-CM | POA: Diagnosis not present

## 2023-09-24 DIAGNOSIS — I10 Essential (primary) hypertension: Secondary | ICD-10-CM | POA: Insufficient documentation

## 2023-09-24 DIAGNOSIS — Z7901 Long term (current) use of anticoagulants: Secondary | ICD-10-CM | POA: Diagnosis not present

## 2023-09-24 DIAGNOSIS — M79631 Pain in right forearm: Secondary | ICD-10-CM | POA: Diagnosis not present

## 2023-09-24 DIAGNOSIS — S5010XA Contusion of unspecified forearm, initial encounter: Secondary | ICD-10-CM

## 2023-09-24 DIAGNOSIS — L03113 Cellulitis of right upper limb: Secondary | ICD-10-CM | POA: Insufficient documentation

## 2023-09-24 DIAGNOSIS — Z794 Long term (current) use of insulin: Secondary | ICD-10-CM | POA: Diagnosis not present

## 2023-09-24 DIAGNOSIS — M79601 Pain in right arm: Secondary | ICD-10-CM | POA: Diagnosis not present

## 2023-09-24 DIAGNOSIS — M19021 Primary osteoarthritis, right elbow: Secondary | ICD-10-CM | POA: Diagnosis not present

## 2023-09-24 DIAGNOSIS — Z79899 Other long term (current) drug therapy: Secondary | ICD-10-CM | POA: Diagnosis not present

## 2023-09-24 LAB — COMPREHENSIVE METABOLIC PANEL
ALT: 23 U/L (ref 0–44)
AST: 29 U/L (ref 15–41)
Albumin: 4.4 g/dL (ref 3.5–5.0)
Alkaline Phosphatase: 48 U/L (ref 38–126)
Anion gap: 9 (ref 5–15)
BUN: 14 mg/dL (ref 8–23)
CO2: 27 mmol/L (ref 22–32)
Calcium: 9.3 mg/dL (ref 8.9–10.3)
Chloride: 97 mmol/L — ABNORMAL LOW (ref 98–111)
Creatinine, Ser: 0.8 mg/dL (ref 0.44–1.00)
GFR, Estimated: >60 mL/min (ref >60)
Glucose, Bld: 164 mg/dL — ABNORMAL HIGH (ref 70–99)
Potassium: 3.8 mmol/L (ref 3.5–5.1)
Sodium: 133 mmol/L — ABNORMAL LOW (ref 135–145)
Total Bilirubin: 0.4 mg/dL (ref 0.0–1.2)
Total Protein: 6.8 g/dL (ref 6.5–8.1)

## 2023-09-24 LAB — CBC WITH DIFFERENTIAL/PLATELET
Abs Immature Granulocytes: 0.02 10*3/uL (ref 0.00–0.07)
Basophils Absolute: 0 10*3/uL (ref 0.0–0.1)
Basophils Relative: 1 %
Eosinophils Absolute: 0.1 10*3/uL (ref 0.0–0.5)
Eosinophils Relative: 2 %
HCT: 39.7 % (ref 36.0–46.0)
Hemoglobin: 13.4 g/dL (ref 12.0–15.0)
Immature Granulocytes: 0 %
Lymphocytes Relative: 24 %
Lymphs Abs: 1.2 10*3/uL (ref 0.7–4.0)
MCH: 28.4 pg (ref 26.0–34.0)
MCHC: 33.8 g/dL (ref 30.0–36.0)
MCV: 84.1 fL (ref 80.0–100.0)
Monocytes Absolute: 0.4 10*3/uL (ref 0.1–1.0)
Monocytes Relative: 8 %
Neutro Abs: 3.3 10*3/uL (ref 1.7–7.7)
Neutrophils Relative %: 65 %
Platelets: 196 10*3/uL (ref 150–400)
RBC: 4.72 MIL/uL (ref 3.87–5.11)
RDW: 13.6 % (ref 11.5–15.5)
WBC: 5.1 10*3/uL (ref 4.0–10.5)
nRBC: 0 % (ref 0.0–0.2)

## 2023-09-24 MED ORDER — CEPHALEXIN 500 MG PO CAPS: 500.0000 mg | ORAL_CAPSULE | Freq: Four times a day (QID) | ORAL | 0 refills | Status: DC

## 2023-09-24 NOTE — Discharge Instructions (Addendum)
 The imaging revealed a fluid collection in the soft tissue space of your forearm consistent with a hematoma.  Please use Tylenol for pain.  You may use 1000 mg of Tylenol every 6 hours.  Not to exceed 4 g of Tylenol within 24 hours.  Please hold your Xarelto for today, keeping the area wrapped with something tightly Coban may help the hematoma to dissolve quicker but I expect that some of the pain, tightness, swelling may last for 1 to 2 weeks.  Please follow-up closely with your primary care doctor to ensure appropriate resolution or return to the emergency department if symptoms significantly worsen despite treatment. Please take the entire course of antibiotics that prescribed.

## 2023-09-24 NOTE — ED Triage Notes (Signed)
 Patient arrives via POV with complaints of new onset right arm swelling, redness, and pain. No falls or injuries. Rates her pain a 6/10.

## 2023-09-24 NOTE — ED Provider Notes (Signed)
 [place] [facility]ENC[facility] DEPARTMENT AT [facility] Provider Note   CSN: [id number] Arrival date & time: 09/24/23  4034     History  Chief Complaint  Patient presents with   Arm Swelling    right    Isabella Wright is a 78 y.o. female with past medical history significant for diabetes, hyperlipidemia, hypertension, iron deficiency anemia, paroxysmal A-fib who is anticoagulated on Xarelto who presents concern of new onset right arm pain, swelling.  She denies any fall, injuries.  She rates her pain 6/10.  She reports that is exquisitely tender to the touch.  Worsened swelling and redness since yesterday.  No previous history of same.  HPI     Home Medications Prior to Admission medications   Medication Sig Start Date End Date Taking? Authorizing Provider  cephALEXin (KEFLEX) 500 MG capsule Take 1 capsule (500 mg total) by mouth 4 (four) times daily. 09/24/23  Yes Airyonna Franklyn H, PA-C  ACCU-CHEK AVIVA PLUS test strip 1 EACH BY OTHER ROUTE 4 (FOUR) TIMES DAILY. USE AS INSTRUCTED DIAG E11.65 03/22/16   [provider]  ACCU-CHEK SOFTCLIX LANCETS lancets 1 EACH BY OTHER ROUTE 3 (THREE) TIMES A DAY. 06/23/16   [provider]  acetaminophen (TYLENOL) 325 MG tablet Take 2 tablets (650 mg total) by mouth every 4 (four) hours as needed for headache or mild pain. 05/14/16   Abelino Derrick, PA-C  amLODipine (NORVASC) 10 MG tablet Take 10 mg by mouth daily.    [provider]  azelastine (ASTELIN) 0.1 % nasal spray Place 2 sprays into both nostrils 2 (two) times daily. 08/30/21   Ardith Dark, MD  B-D UF III MINI PEN NEEDLES 31G X 5 MM MISC Inject into the skin daily. 08/10/22   [provider]  chlorthalidone (HYGROTON) 25 MG tablet Take by mouth. 06/18/20   [provider]  cholecalciferol (VITAMIN D) 1000 UNITS tablet Take 2,000 Units by mouth daily.     [provider]  clobetasol (TEMOVATE) 0.05 % external solution Apply topically.  07/13/20   [provider]  Continuous Blood Gluc Sensor (FREESTYLE LIBRE SENSOR SYSTEM) MISC 1 EACH BY DOES NOT APPLY ROUTE EVERY 10 DAYS. 07/17/17   [provider]  fenofibrate 160 MG tablet TAKE 1 TABLET BY MOUTH EVERY DAY FOR HIGH TRIGLYCERIDES 04/13/23   Ardith Dark, MD  flecainide (TAMBOCOR) 50 MG tablet Take 50 mg by mouth 2 (two) times daily. 03/07/22   [provider]  fluticasone (FLONASE) 50 MCG/ACT nasal spray Place 1-2 sprays into both nostrils at bedtime as needed for allergies or rhinitis.    [provider]  hydrALAZINE (APRESOLINE) 25 MG tablet Take 1 tablet (25 mg total) by mouth 3 (three) times daily as needed. For BP > 180/120 02/12/20   Ardith Dark, MD  hydrocortisone (ANUSOL-HC) 25 MG suppository Place 1 suppository (25 mg total) rectally 2 (two) times daily. 10/20/20   Ardith Dark, MD  Influenza vac split quadrivalent PF (FLUZONE HIGH-DOSE) 0.5 ML injection TO BE ADMINISTERED BY PHARMACIST FOR IMMUNIZATION    [provider]  insulin aspart (FIASP FLEXTOUCH) 100 UNIT/ML FlexTouch Pen Inject 30 units into the skin before breakfast, 30 units before lunch, and 40 units before dinner. (Total daily dose = 100 units.) 08/08/22   [provider]  Insulin Pen Needle 32G X 6 MM MISC SMARTSIG:Injection 3 Times Daily 03/10/20   [provider]  ketoconazole (NIZORAL) 2 % shampoo  03/13/20  [provider]  loratadine (CLARITIN) 10 MG tablet Take 1 tablet (10 mg total) by mouth daily. 08/31/22   Lula Olszewski, MD  metFORMIN (GLUCOPHAGE-XR) 500 MG 24 hr tablet Take 1 tablet (500 mg total) by mouth daily with breakfast. 1000 MG TAKE IN THE EVENING 05/16/16   Corine Shelter K, PA-C  metoprolol tartrate (LOPRESSOR) 50 MG tablet Take 1 tablet by mouth 2 (two) times daily. 07/14/22   [provider]  Multiple Vitamins-Minerals (PRESERVISION AREDS 2 PO) Take 1 tablet by mouth 2 (two) times daily.     [provider]  neomycin-polymyxin b-dexamethasone (MAXITROL) 3.5-10000-0.1 OINT Place 1 application  into the left eye 2 (two) times daily. 01/01/20   [provider]  Omega-3 Fatty Acids (OMEGA-3 FISH OIL PO) Take by mouth.    [provider]  omeprazole (PRILOSEC) 20 MG capsule TAKE 1 CAPSULE BY MOUTH ONCE DAILY 07/03/23   Ardith Dark, MD  pseudoephedrine (SUDAFED 12 HOUR) 120 MG 12 hr tablet Take 1 tablet (120 mg total) by mouth 2 (two) times daily. 08/31/22   Lula Olszewski, MD  ramipril (ALTACE) 10 MG capsule Take 2 capsules (20 mg total) by mouth daily. Please make overdue appt with Cardiologist before anymore refills. Thank you 1st attempt 08/04/21   Hillis Range, MD  rosuvastatin (CRESTOR) 20 MG tablet Take 1 tablet by mouth daily. 07/22/22   [provider]  Saline (SIMPLY SALINE) 0.9 % AERS Place 2 each into the nose daily as needed. 08/31/22   Lula Olszewski, MD  tobramycin (TOBREX) 0.3 % ophthalmic solution Place into the left eye. 01/01/20   [provider]  TRESIBA FLEXTOUCH 200 UNIT/ML FlexTouch Pen SMARTSIG:60 Unit(s) SUB-Q Daily    [provider]  triamcinolone (KENALOG) 0.1 % Apply 1 application  topically 2 (two) times daily as needed. 07/13/20   [provider]  TRULICITY 0.75 MG/0.5ML SOPN Inject into the skin. 08/09/22   [provider]  XARELTO 20 MG TABS tablet TAKE 1 TABLET BY MOUTH EVERY DAY WITH SUPPER 04/20/21   Allred, Fayrene Fearing, MD      Allergies    Fluorescein, Premarin  [conjugated estrogens], Demerol [meperidine], Exenatide, Liraglutide, and Covid-19 ad26 vaccine(janssen)    Review of Systems   Review of Systems  All other systems reviewed and are negative.   Physical Exam Updated Vital Signs BP (!) 116/105   Pulse (!) 52   Temp 97.8 F (36.6 C) (Temporal)   Resp 20   Ht 5\' 3"  (1.6 m)   Wt 59.9 kg   SpO2 97%   BMI 23.38 kg/m  Physical Exam Vitals and nursing note reviewed.   Constitutional:      General: She is not in acute distress.    Appearance: Normal appearance.  HENT:     Head: Normocephalic and atraumatic.  Eyes:     General:        Right eye: No discharge.        Left eye: No discharge.  Cardiovascular:     Rate and Rhythm: Normal rate and regular rhythm.     Pulses: Normal pulses.     Heart sounds: No murmur heard.    No friction rub. No gallop.     Comments: Radial, ulnar pulses are 2+ in the affected right upper extremity Pulmonary:     Effort: Pulmonary effort is normal.     Breath sounds: Normal breath sounds.  Abdominal:     General: Bowel sounds  are normal.     Palpations: Abdomen is soft.  Musculoskeletal:     Comments: Focally swollen, tender, right arm on anterior forearm. Skin somewhat indurated, red.  Surrounding skin tissue with dependent pattern of bruising, suspicious for hematoma or other blood extravasation  Skin:    General: Skin is warm and dry.     Capillary Refill: Capillary refill takes less than 2 seconds.  Neurological:     Mental Status: She is alert and oriented to person, place, and time.  Psychiatric:        Mood and Affect: Mood normal.        Behavior: Behavior normal.     ED Results / Procedures / Treatments   Labs (all labs ordered are listed, but only abnormal results are displayed) Labs Reviewed  COMPREHENSIVE METABOLIC PANEL - Abnormal; Notable for the following components:      Result Value   Sodium 133 (*)    Chloride 97 (*)    Glucose, Bld 164 (*)    All other components within normal limits  CBC WITH DIFFERENTIAL/PLATELET    EKG None  Radiology US Venous Img Upper Right (DVT Study) Result Date: 09/24/2023 CLINICAL DATA:  Right forearm pain, redness and swelling EXAM: RIGHT UPPER EXTREMITY VENOUS DOPPLER ULTRASOUND TECHNIQUE: Gray-scale sonography with graded compression, as well as color Doppler and duplex ultrasound were performed to evaluate the upper extremity deep venous system from  the level of the subclavian vein and including the jugular, axillary, basilic, radial, ulnar and upper cephalic vein. Spectral Doppler was utilized to evaluate flow at rest and with distal augmentation maneuvers. COMPARISON:  None Available. FINDINGS: Contralateral Subclavian Vein: Respiratory phasicity is normal and symmetric with the symptomatic side. No evidence of thrombus. Normal compressibility. Internal Jugular Vein: No evidence of thrombus. Normal compressibility, respiratory phasicity and response to augmentation. Subclavian Vein: No evidence of thrombus. Normal compressibility, respiratory phasicity and response to augmentation. Axillary Vein: No evidence of thrombus. Normal compressibility, respiratory phasicity and response to augmentation. Cephalic Vein: No evidence of thrombus. Normal compressibility, respiratory phasicity and response to augmentation. Basilic Vein: No evidence of thrombus. Normal compressibility, respiratory phasicity and response to augmentation. Brachial Veins: No evidence of thrombus. Normal compressibility, respiratory phasicity and response to augmentation. Radial Veins: No evidence of thrombus. Normal compressibility, respiratory phasicity and response to augmentation. Ulnar Veins: No evidence of thrombus. Normal compressibility, respiratory phasicity and response to augmentation. Venous Reflux:  None visualized. Other Findings: Ill-defined masslike heterogeneity within the muscles of the proximal forearm. Measurement is difficult given the multilobular appearance of the abnormalities. There is some focal cystic degeneration. No definite internal vascularity. IMPRESSION: No evidence of DVT within the right upper extremity. Multilobulated heterogeneous soft tissue mass within the muscles of the dorsal radial forearm. Differential considerations include intramuscular hematoma in the setting of muscle tear versus soft tissue mass such as sarcoma. Consider further evaluation with  contrast-enhanced MRI of the forearm. Electronically Signed   By: Malachy Moan M.D.   On: 09/24/2023 10:58   DG Forearm Right Result Date: 09/24/2023 CLINICAL DATA:  Arm redness and swelling.  No history of trauma EXAM: RIGHT FOREARM - 2 VIEW COMPARISON:  None Available. FINDINGS: Osteopenia. No fracture or dislocation. Slight joint space loss at the elbow. IMPRESSION: Osteopenia.  No acute osseous abnormality Electronically Signed   By: Karen Kays M.D.   On: 09/24/2023 09:35    Procedures Procedures    Medications Ordered in ED Medications - No data to display  ED  Course/ Medical Decision Making/ A&P                                 Medical Decision Making  This patient is a 78 y.o. female  who presents to the ED for concern of right arm pain, redness.   Differential diagnoses prior to evaluation: The emergent differential diagnosis includes, but is not limited to, cellulitis, torn tendon, ligament, fracture, dislocation, DVT,. This is not an exhaustive differential.   Past Medical History / Co-morbidities / Social History: diabetes, hyperlipidemia, hypertension, iron deficiency anemia, paroxysmal A-fib who is anticoagulated on Xarelto  Additional history: Chart reviewed. Pertinent results include: Reviewed outpatient family medicine, cardiology visits  Physical Exam: Physical exam performed. The pertinent findings include: Focally swollen, tender, right arm on anterior forearm. Skin somewhat indurated, red.  Surrounding skin tissue with dependent pattern of bruising, suspicious for hematoma or other blood extravasation    Radial, ulnar pulses are 2+ in the affected right upper extremity  Lab Tests/Imaging studies: I personally interpreted labs/imaging and the pertinent results include: CBC unremarkable, CMP unremarkable other than mildly elevated glucose at 164, sodium 133..  Independently interpreted plain film radiographs of the right forearm, ultrasound of the right  forearm and CT of the right forearm which shows suspicion for heterogenous fluid collection such as hematoma versus sarcoma, overall with high clinical suspicion for hematoma based on her clinical evaluation presentation.  I agree with the radiologist interpretation.   Medications: I ordered medication including we will treat with Keflex for possible early cellulitis, and Coban, will have patient hold her Xarelto today.  Encourage close PCP follow-up..  I have reviewed the patients home medicines and have made adjustments as needed.   Disposition: After consideration of the diagnostic results and the patients response to treatment, I feel that patient stable for discharge with plan as above.   emergency department workup does not suggest an emergent condition requiring admission or immediate intervention beyond what has been performed at this time. The plan is: as above. The patient is safe for discharge and has been instructed to return immediately for worsening symptoms, change in symptoms or any other concerns.  Final Clinical Impression(s) / ED Diagnoses Final diagnoses:  Hematoma of forearm  Cellulitis of right upper extremity    Rx / DC Orders ED Discharge Orders          Ordered    cephALEXin (KEFLEX) 500 MG capsule  4 times daily        09/24/23 1127              Kelon Easom, [place], PA-C 09/24/23 1154    Jacalyn Lefevre, MD 09/24/23 1434

## 2023-09-24 NOTE — ED Notes (Signed)
 Ace wrap applied to R arm around area of swelling. CNS intact after application

## 2023-09-25 DIAGNOSIS — R002 Palpitations: Secondary | ICD-10-CM | POA: Diagnosis not present

## 2023-09-25 DIAGNOSIS — E119 Type 2 diabetes mellitus without complications: Secondary | ICD-10-CM | POA: Diagnosis not present

## 2023-09-25 DIAGNOSIS — I10 Essential (primary) hypertension: Secondary | ICD-10-CM | POA: Diagnosis not present

## 2023-09-25 DIAGNOSIS — E782 Mixed hyperlipidemia: Secondary | ICD-10-CM | POA: Diagnosis not present

## 2023-09-25 DIAGNOSIS — Z794 Long term (current) use of insulin: Secondary | ICD-10-CM | POA: Diagnosis not present

## 2023-09-25 DIAGNOSIS — I447 Left bundle-branch block, unspecified: Secondary | ICD-10-CM | POA: Diagnosis not present

## 2023-09-25 DIAGNOSIS — Z8679 Personal history of other diseases of the circulatory system: Secondary | ICD-10-CM | POA: Diagnosis not present

## 2023-09-25 DIAGNOSIS — Z1330 Encounter for screening examination for mental health and behavioral disorders, unspecified: Secondary | ICD-10-CM | POA: Diagnosis not present

## 2023-09-27 ENCOUNTER — Ambulatory Visit (INDEPENDENT_AMBULATORY_CARE_PROVIDER_SITE_OTHER): Admitting: Family Medicine

## 2023-09-27 ENCOUNTER — Encounter: Payer: Self-pay | Admitting: Family Medicine

## 2023-09-27 VITALS — BP 146/81 | HR 61 | Temp 97.7°F | Ht 63.0 in | Wt 152.8 lb

## 2023-09-27 DIAGNOSIS — I10 Essential (primary) hypertension: Secondary | ICD-10-CM

## 2023-09-27 DIAGNOSIS — J309 Allergic rhinitis, unspecified: Secondary | ICD-10-CM | POA: Diagnosis not present

## 2023-09-27 DIAGNOSIS — M79601 Pain in right arm: Secondary | ICD-10-CM

## 2023-09-27 DIAGNOSIS — I4819 Other persistent atrial fibrillation: Secondary | ICD-10-CM | POA: Diagnosis not present

## 2023-09-27 MED ORDER — BACLOFEN 10 MG PO TABS: 10.0000 mg | ORAL_TABLET | Freq: Three times a day (TID) | ORAL | 0 refills | Status: AC | PRN

## 2023-09-27 MED ORDER — HYDROCODONE-ACETAMINOPHEN 5-325 MG PO TABS: 1.0000 | ORAL_TABLET | Freq: Four times a day (QID) | ORAL | 0 refills | Status: DC | PRN

## 2023-09-27 NOTE — Progress Notes (Signed)
 Isabella Wright is a 78 y.o. female who presents today for an office visit.  Assessment/Plan:  New/Acute Problems: Right Arm Pain / Hematoma Her hematoma is resolving though she still does have quite a bit of tightness along the extensor compartment of her right forearm.  She is neurovascularly intact distally has no signs or symptoms of compartment syndrome.  She is likely having some muscular spasms in the area of hematoma and we will add on baclofen to her regimen.  Will also give small supply of hydrocodone to use as needed for pain.  She has tolerated this well in the past.    We did discuss that she likely had a small muscular tear in the area that precipitated this.  We did discuss obtaining a an MRI for further evaluation however given her significant improvement over the last couple of days would be reasonable to hold off on this for now.  Anticipate that she will have continued improvement over the next couple of weeks.  She will continue with conservative management as directed by her cardiologist with elevation and warm heating pad.  She is currently holding Xarelto per cardiology.  She will let us know if not improving the next 1 to 2 weeks and would consider MRI at that time.  We discussed reasons to return to care and seek emergent care.   Chronic Problems Addressed Today: Persistent atrial fibrillation (HCC) - s/p Ablation, miantaining NSR, CHA2DS2VASc 4 (on Xarelto) Following with cardiology.  We are holding Xarelto as above per cardiology.  She is rate controlled on metoprolol and flecainide.  Allergic rhinitis Mild flareup recently.  Likely due to pollen outbreak.  She can continue Flonase.  She will restart her Astelin.  She will let us know if not improving.  Essential hypertension Mildly elevated today in setting of acute pain.  She has typically been well-controlled.  Will continue current regimen Norvasc 10 mg daily, ramipril 20 mg daily, chlorthalidone 25 mg daily, Toprol  tartrate 50 mg twice daily, and hydralazine 50 mg 3 times daily per cardiology.     Subjective:  HPI:  See A/P for status of chronic conditions.  Patient is here today for ED follow-up.  She went to the ED 3 days ago with right arm pain and swelling.  In the ED had workup including CT scan and ultrasound that was notable for likely hematoma.  There was also concern for overlying cellulitis.  She was told to hold her Xarelto and started on Keflex.  She saw her cardiologist the day afterwards and they told her to hold the Xarelto for the next week. She was recommended start a heating pad.  They swelling has improved significantly the last couple of days.  She has a lot of bruising to the bottom of her arm.  No fevers or chills.  Still has a lot of pain in her arm.  Worse with certain motions.       Objective:  Physical Exam: BP (!) 146/81   Pulse 61   Temp 97.7 F (36.5 C)   Ht 5\' 3"  (1.6 m)   Wt 152 lb 12.8 oz (69.3 kg)   SpO2 95%   BMI 27.07 kg/m   Gen: No acute distress, resting comfortably CV: Regular rate and rhythm with no murmurs appreciated Pulm: Normal work of breathing, clear to auscultation bilaterally with no crackles, wheezes, or rhonchi MUSCULOSKELETAL: - Arm:  resolving hematoma noted on the right forearm with significant ecchymosis noted along the dependent  aspect along right ulnar aspect of forearm.  She has quite a bit of tightness in her extensor compartment.  Neurovascularly intact distally.  No loss of sensation.  Good distal cap refill.  Radial pulse 2+ and symmetric bilaterally. Neuro: Grossly normal, moves all extremities Psych: Normal affect and thought content  Time Spent: 45 minutes of total time was spent on the date of the encounter performing the following actions: chart review prior to seeing the patient including recent Emergency Department visit obtaining history, performing a medically necessary exam, counseling on the treatment plan, placing orders, and  documenting in our EHR.        Katina Degree. Jimmey Ralph, MD 09/27/2023 9:43 AM

## 2023-09-27 NOTE — Assessment & Plan Note (Signed)
 Following with cardiology.  We are holding Xarelto as above per cardiology.  She is rate controlled on metoprolol and flecainide.

## 2023-09-27 NOTE — Assessment & Plan Note (Signed)
 Mild flareup recently.  Likely due to pollen outbreak.  She can continue Flonase.  She will restart her Astelin.  She will let us know if not improving.

## 2023-09-27 NOTE — Assessment & Plan Note (Signed)
 Mildly elevated today in setting of acute pain.  She has typically been well-controlled.  Will continue current regimen Norvasc 10 mg daily, ramipril 20 mg daily, chlorthalidone 25 mg daily, Toprol tartrate 50 mg twice daily, and hydralazine 50 mg 3 times daily per cardiology.

## 2023-09-27 NOTE — Patient Instructions (Addendum)
 It was very nice to see you today!  I am glad that you are improving.  Please try the baclofen.   Use the pain medication as needed.   Return if symptoms worsen or fail to improve.   Take care, Dr Jimmey Ralph  PLEASE NOTE:  If you had any lab tests, please let us know if you have not heard back within a few days. You may see your results on mychart before we have a chance to review them but we will give you a call once they are reviewed by [name].   If we ordered any referrals today, please let us know if you have not heard from their office within the next week.   If you had any urgent prescriptions sent in today, please check with the pharmacy within an hour of our visit to make sure the prescription was transmitted appropriately.   Please try these tips to maintain a healthy lifestyle:  Eat at least 3 REAL meals and 1-2 snacks per day.  Aim for no more than 5 hours between eating.  If you eat breakfast, please do so within one hour of getting up.   Each meal should contain half fruits/vegetables, one quarter protein, and one quarter carbs (no bigger than a computer mouse)  Cut down on sweet beverages. This includes juice, soda, and sweet tea.   Drink at least 1 glass of water with each meal and aim for at least 8 glasses per day  Exercise at least 150 minutes every week.

## 2023-10-05 ENCOUNTER — Other Ambulatory Visit: Payer: Self-pay | Admitting: Family Medicine

## 2023-10-05 DIAGNOSIS — E782 Mixed hyperlipidemia: Secondary | ICD-10-CM | POA: Diagnosis not present

## 2023-10-05 DIAGNOSIS — E559 Vitamin D deficiency, unspecified: Secondary | ICD-10-CM | POA: Diagnosis not present

## 2023-10-05 DIAGNOSIS — E785 Hyperlipidemia, unspecified: Secondary | ICD-10-CM | POA: Diagnosis not present

## 2023-10-05 DIAGNOSIS — E119 Type 2 diabetes mellitus without complications: Secondary | ICD-10-CM | POA: Diagnosis not present

## 2023-10-05 DIAGNOSIS — E1169 Type 2 diabetes mellitus with other specified complication: Secondary | ICD-10-CM | POA: Diagnosis not present

## 2023-10-05 DIAGNOSIS — Z794 Long term (current) use of insulin: Secondary | ICD-10-CM | POA: Diagnosis not present

## 2023-10-11 DIAGNOSIS — H353123 Nonexudative age-related macular degeneration, left eye, advanced atrophic without subfoveal involvement: Secondary | ICD-10-CM | POA: Diagnosis not present

## 2023-10-11 DIAGNOSIS — H353114 Nonexudative age-related macular degeneration, right eye, advanced atrophic with subfoveal involvement: Secondary | ICD-10-CM | POA: Diagnosis not present

## 2023-10-24 DIAGNOSIS — H53413 Scotoma involving central area, bilateral: Secondary | ICD-10-CM | POA: Diagnosis not present

## 2023-11-01 DIAGNOSIS — H53413 Scotoma involving central area, bilateral: Secondary | ICD-10-CM | POA: Diagnosis not present

## 2023-11-02 ENCOUNTER — Ambulatory Visit

## 2023-11-13 ENCOUNTER — Other Ambulatory Visit: Payer: Self-pay | Admitting: Family Medicine

## 2023-11-15 ENCOUNTER — Ambulatory Visit: Admitting: Family Medicine

## 2023-11-15 ENCOUNTER — Encounter: Payer: Self-pay | Admitting: Family Medicine

## 2023-11-15 VITALS — BP 124/71 | HR 62 | Temp 97.9°F | Ht 63.0 in | Wt 148.8 lb

## 2023-11-15 DIAGNOSIS — I10 Essential (primary) hypertension: Secondary | ICD-10-CM | POA: Diagnosis not present

## 2023-11-15 DIAGNOSIS — J020 Streptococcal pharyngitis: Secondary | ICD-10-CM | POA: Diagnosis not present

## 2023-11-15 DIAGNOSIS — J309 Allergic rhinitis, unspecified: Secondary | ICD-10-CM

## 2023-11-15 DIAGNOSIS — R059 Cough, unspecified: Secondary | ICD-10-CM | POA: Diagnosis not present

## 2023-11-15 LAB — POCT INFLUENZA A/B
Influenza A, POC: NEGATIVE
Influenza B, POC: NEGATIVE

## 2023-11-15 LAB — POC COVID19 BINAXNOW: SARS Coronavirus 2 Ag: NEGATIVE

## 2023-11-15 LAB — POCT RAPID STREP A (OFFICE): Rapid Strep A Screen: POSITIVE — AB

## 2023-11-15 MED ORDER — AMOXICILLIN 500 MG PO CAPS: 500.0000 mg | ORAL_CAPSULE | Freq: Three times a day (TID) | ORAL | 0 refills | Status: AC

## 2023-11-15 MED ORDER — PROMETHAZINE-DM 6.25-15 MG/5ML PO SYRP
5.0000 mL | ORAL_SOLUTION | Freq: Four times a day (QID) | ORAL | 0 refills | Status: AC | PRN
Start: 2023-11-15 — End: ?

## 2023-11-15 NOTE — Assessment & Plan Note (Signed)
 Worse in recent.  May be contributing to some of her symptoms though we are treating strep pharyngitis as above.  She can continue her allergy medication regimen including Astelin  and Claritin .

## 2023-11-15 NOTE — Patient Instructions (Signed)
 It was very nice to see you today!  Your strep test is positive.  Please start the amoxicillin .  Use cough medication as needed.  Let us  know if not improving.   Return if symptoms worsen or fail to improve.   Take care, Dr Daneil Dunker  PLEASE NOTE:  If you had any lab tests, please let us  know if you have not heard back within a few days. You may see your results on mychart before we have a chance to review them but we will give you a call once they are reviewed by us .   If we ordered any referrals today, please let us  know if you have not heard from their office within the next week.   If you had any urgent prescriptions sent in today, please check with the pharmacy within an hour of our visit to make sure the prescription was transmitted appropriately.   Please try these tips to maintain a healthy lifestyle:  Eat at least 3 REAL meals and 1-2 snacks per day.  Aim for no more than 5 hours between eating.  If you eat breakfast, please do so within one hour of getting up.   Each meal should contain half fruits/vegetables, one quarter protein, and one quarter carbs (no bigger than a computer mouse)  Cut down on sweet beverages. This includes juice, soda, and sweet tea.   Drink at least 1 glass of water with each meal and aim for at least 8 glasses per day  Exercise at least 150 minutes every week.

## 2023-11-15 NOTE — Progress Notes (Signed)
   Isabella Wright is a 78 y.o. female who presents today for an office visit.  Assessment/Plan:  New/Acute Problems: Strep Pharyngitis Rapid strep positive.  COVID and flu negative.  Start amoxicillin .  Encouraged hydration.  She can use over-the-counter meds as needed as well.  Will start dextromethorphan cough syrup as well to use as needed.  Discussed reasons return to care.  Follow-up as needed.  Chronic Problems Addressed Today: Essential hypertension Blood pressure at goal today on current regimen per cardiology with Norvasc  10 mg daily, ramipril  20 mg daily, chlorthalidone 25 mg daily, Toprol  tartrate 50 mg twice daily, and hydralazine  50 mg 3 times daily.  Allergic rhinitis Worse in recent.  May be contributing to some of her symptoms though we are treating strep pharyngitis as above.  She can continue her allergy medication regimen including Astelin  and Claritin .     Subjective:  HPI:  See Assessment / plan for status of chronic conditions. She is here today with sore throat, cough and congestion. Started a couple of days ago. Tried OTC gargles and Listerine with some improvement. No fevers or chills. No chest pain or shortness of breath. Cousin was sick with similar symptoms a few weeks ago. She has also had more popping in her ears.        Objective:  Physical Exam: BP 124/71   Pulse 62   Temp 97.9 F (36.6 C) (Temporal)   Ht 5\' 3"  (1.6 m)   Wt 148 lb 12.8 oz (67.5 kg)   SpO2 97%   BMI 26.36 kg/m   Gen: No acute distress, resting comfortably HEENT: TMs clear effusion.  OP erythematous. CV: Regular rate and rhythm with no murmurs appreciated Pulm: Normal work of breathing, clear to auscultation bilaterally with no crackles, wheezes, or rhonchi Neuro: Grossly normal, moves all extremities Psych: Normal affect and thought content      Cerria Randhawa M. Daneil Dunker, MD 11/15/2023 8:43 AM

## 2023-11-15 NOTE — Assessment & Plan Note (Signed)
 Blood pressure at goal today on current regimen per cardiology with Norvasc  10 mg daily, ramipril  20 mg daily, chlorthalidone 25 mg daily, Toprol  tartrate 50 mg twice daily, and hydralazine  50 mg 3 times daily.

## 2023-11-17 ENCOUNTER — Other Ambulatory Visit: Payer: Self-pay | Admitting: *Deleted

## 2023-11-17 ENCOUNTER — Telehealth: Payer: Self-pay | Admitting: *Deleted

## 2023-11-17 MED ORDER — ALBUTEROL SULFATE HFA 108 (90 BASE) MCG/ACT IN AERS
2.0000 | INHALATION_SPRAY | Freq: Four times a day (QID) | RESPIRATORY_TRACT | 0 refills | Status: AC | PRN
Start: 2023-11-17 — End: ?

## 2023-11-17 NOTE — Telephone Encounter (Signed)
 Rx send to [facility] pharmacy

## 2023-11-17 NOTE — Telephone Encounter (Signed)
 Copied from CRM [id number]. Topic: Clinical - Medication Question >> Nov 17, 2023  8:21 AM Opal Bill wrote: Reason for CRM: Pt was just in on Wednesday. She has strep throat. She hasn't been sleeping because of the cough and now she is hearing some wheezing in her chest. She is asking if she can get an inhaler sent to the pharmacy. She also requested to speak with Dr. Daneil Dunker if possible. She is not wanting to wait until Monday.

## 2023-11-17 NOTE — Telephone Encounter (Signed)
 Please see message and advise

## 2023-11-17 NOTE — Telephone Encounter (Signed)
 Okay to send in albuterol inhaler take 1 to 2 puffs every 6 hours as needed for wheezing or shortness of breath.

## 2023-11-21 ENCOUNTER — Other Ambulatory Visit: Payer: Self-pay | Admitting: Family Medicine

## 2023-11-22 ENCOUNTER — Ambulatory Visit

## 2023-11-22 DIAGNOSIS — E118 Type 2 diabetes mellitus with unspecified complications: Secondary | ICD-10-CM

## 2023-11-22 NOTE — Progress Notes (Signed)
 Subjective:   Isabella Wright is a 78 y.o. who presents for a Medicare Wellness preventive visit.  Visit Complete: Virtual I connected with  Danie Duran on 11/22/23 by a audio enabled telemedicine application and verified that I am speaking with the correct person using two identifiers.  Patient Location: Home  Provider Location: Home Office  I discussed the limitations of evaluation and management by telemedicine. The patient expressed understanding and agreed to proceed.  Vital Signs: Because this visit was a virtual/telehealth visit, some criteria may be missing or patient reported. Any vitals not documented were not able to be obtained and vitals that have been documented are patient reported.  VideoDeclined- This patient declined Librarian, academic. Therefore the visit was completed with audio only.  Persons Participating in Visit: Patient.  AWV Questionnaire: No: Patient Medicare AWV questionnaire was not completed prior to this visit.  Cardiac Risk Factors include: advanced age (>17men, >81 women);dyslipidemia;diabetes mellitus;hypertension     Objective:    Today's Vitals   11/22/23 1508  Weight: 140 lb (63.5 kg)  Height: 5\' 3"  (1.6 m)   Body mass index is 24.8 kg/m.     11/22/2023    3:11 PM 09/27/2022    9:40 AM 08/22/2022    9:04 AM 04/01/2022    4:02 PM 09/14/2021    9:38 AM 08/14/2020    1:12 PM 01/05/2017    7:52 PM  Advanced Directives  Does Patient Have a Medical Advance Directive? Yes Yes Yes No Yes Yes Yes  Type of Estate agent of [place];Living will Healthcare Power of [place];Living will Living will  Healthcare Power of [employer] Power of [place];Living will   Copy of Healthcare Power of Attorney in Chart? No - copy requested No - copy requested   No - copy requested No - copy requested   Would patient like information on creating a medical advance directive?    No - Patient declined        Current Medications (verified) Outpatient Encounter Medications as of 11/22/2023  Medication Sig   ACCU-CHEK AVIVA PLUS test strip 1 EACH BY OTHER ROUTE 4 (FOUR) TIMES DAILY. USE AS INSTRUCTED DIAG E11.65   ACCU-CHEK SOFTCLIX LANCETS lancets 1 EACH BY OTHER ROUTE 3 (THREE) TIMES A DAY.   acetaminophen  (TYLENOL ) 325 MG tablet Take 2 tablets (650 mg total) by mouth every 4 (four) hours as needed for headache or mild pain.   albuterol (VENTOLIN HFA) 108 (90 Base) MCG/ACT inhaler Inhale 2 puffs into the lungs every 6 (six) hours as needed for wheezing or shortness of breath.   amLODipine  (NORVASC ) 10 MG tablet Take 10 mg by mouth daily.   amoxicillin  (AMOXIL ) 500 MG capsule Take 1 capsule (500 mg total) by mouth 3 (three) times daily for 10 days.   azelastine  (ASTELIN ) 0.1 % nasal spray USE 2 SPRAYS IN EACH NOSTRIL 2 TIMES DAILY   B-D UF III MINI PEN NEEDLES 31G X 5 MM MISC Inject into the skin daily.   baclofen  (LIORESAL ) 10 MG tablet Take 1 tablet (10 mg total) by mouth 3 (three) times daily as needed for muscle spasms.   chlorthalidone (HYGROTON) 25 MG tablet Take by mouth.   cholecalciferol  (VITAMIN D ) 1000 UNITS tablet Take 2,000 Units by mouth daily.    clobetasol (TEMOVATE) 0.05 % external solution Apply topically.   Continuous Blood Gluc Sensor (FREESTYLE LIBRE SENSOR SYSTEM) MISC 1 EACH BY DOES NOT APPLY ROUTE EVERY 10 DAYS.  fenofibrate  160 MG tablet TAKE 1 TABLET BY MOUTH EVERY DAY FOR HIGH TRIGLYCERIDES   flecainide (TAMBOCOR) 50 MG tablet Take 50 mg by mouth 2 (two) times daily.   fluticasone (FLONASE) 50 MCG/ACT nasal spray Place 1-2 sprays into both nostrils at bedtime as needed for allergies or rhinitis.   glucose blood test strip 1 EACH BY OTHER ROUTE 4 (FOUR) TIMES DAILY. USE AS INSTRUCTED DIAG E11.65   hydrocortisone  (ANUSOL -HC) 25 MG suppository Place 1 suppository (25 mg total) rectally 2 (two) times daily.   Insulin  Glargine (BASAGLAR KWIKPEN) 100 UNIT/ML SMARTSIG:60  Unit(s) SUB-Q Daily   Insulin  Pen Needle 32G X 6 MM MISC SMARTSIG:Injection 3 Times Daily   ketoconazole (NIZORAL) 2 % shampoo    loratadine  (CLARITIN ) 10 MG tablet Take 1 tablet (10 mg total) by mouth daily.   metFORMIN  (GLUCOPHAGE -XR) 500 MG 24 hr tablet Take 1 tablet (500 mg total) by mouth daily with breakfast. 1000 MG TAKE IN THE EVENING   metoprolol  tartrate (LOPRESSOR ) 50 MG tablet Take 1 tablet by mouth 2 (two) times daily.   Multiple Vitamins-Minerals (PRESERVISION AREDS 2 PO) Take 1 tablet by mouth 2 (two) times daily.    neomycin-polymyxin b-dexamethasone (MAXITROL) 3.5-10000-0.1 OINT Place 1 application  into the left eye 2 (two) times daily.   NOVOLOG  FLEXPEN 100 UNIT/ML FlexPen Inject 30 units into the skin before breakfast, 30 units before lunch, and 40 units before dinner. (Total daily dose = 100 units.)   Omega-3 Fatty Acids (OMEGA-3 FISH OIL PO) Take by mouth.   omeprazole  (PRILOSEC ) 20 MG capsule TAKE 1 CAPSULE BY MOUTH ONCE DAILY   promethazine -dextromethorphan (PROMETHAZINE -DM) 6.25-15 MG/5ML syrup Take 5 mLs by mouth 4 (four) times daily as needed.   pseudoephedrine  (SUDAFED 12 HOUR) 120 MG 12 hr tablet Take 1 tablet (120 mg total) by mouth 2 (two) times daily.   ramipril  (ALTACE ) 10 MG capsule Take 2 capsules (20 mg total) by mouth daily. Please make overdue appt with Cardiologist before anymore refills. Thank you 1st attempt   rosuvastatin  (CRESTOR ) 20 MG tablet Take 1 tablet by mouth daily.   Saline (SIMPLY SALINE) 0.9 % AERS Place 2 each into the nose daily as needed.   tobramycin (TOBREX) 0.3 % ophthalmic solution Place into the left eye.   triamcinolone (KENALOG) 0.1 % Apply 1 application  topically 2 (two) times daily as needed.   TRULICITY 0.75 MG/0.5ML SOPN Inject into the skin.   XARELTO  20 MG TABS tablet TAKE 1 TABLET BY MOUTH EVERY DAY WITH SUPPER   [DISCONTINUED] hydrALAZINE  (APRESOLINE ) 25 MG tablet Take 1 tablet (25 mg total) by mouth 3 (three) times daily as  needed. For BP > 180/120   [DISCONTINUED] HYDROcodone -acetaminophen  (NORCO/VICODIN) 5-325 MG tablet Take 1 tablet by mouth every 6 (six) hours as needed for moderate pain (pain score 4-6).   [DISCONTINUED] insulin  aspart (FIASP  FLEXTOUCH) 100 UNIT/ML FlexTouch Pen Inject 30 units into the skin before breakfast, 30 units before lunch, and 40 units before dinner. (Total daily dose = 100 units.)   [DISCONTINUED] TRESIBA FLEXTOUCH 200 UNIT/ML FlexTouch Pen SMARTSIG:60 Unit(s) SUB-Q Daily   No facility-administered encounter medications on file as of 11/22/2023.    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   Diabetes mellitus type 2, controlled (HCC) 2012   GERD (gastroesophageal reflux  disease)    History of hiatal hernia    Hypertension 2000   2009: Renal Dopplers showed R RA ostial stenosis -- (told not a problem or 20 yrs)   Hypertriglyceridemia 1996   Very labile and difficult control: Began in '96 when she began taking Premarin after hysterectomy -- triglycerides increased to 3582 neuropathy in her feet> care for by endocrinologis; levels vary based on stress.;; Labs from March 2015: TC 150, TG 745, HDL 26, LDL 45   Iron deficiency anemia 04/30/2019   Kidney stones    LBBB (left bundle branch block) 2009   Initially diagnosed as rate related during stress echo.   Macular degeneration    "dry in right; treating like wet in the left" (03/15/2016)   Microcytic anemia 04/26/2019   Paroxysmal SVT (supraventricular tachycardia) (HCC) 2009   less frequent following I&D of chest wall abscess (Jan 015), not previously documented, may have been afib.   Personal history of atrial fibrillation 01/04/2016   a. 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 ->  No evidence of acute cerebral infarct.  Brain MRI shows abnormal appearance of distal left vertebral artery flow.   Vertebral artery occlusion, left    Noted on MRI/A of the neck in April 2018 --left vertebral artery occluded at origin and reconstitutes at level of V3.  This is similar to what was found on carotid Dopplers. -->  ASYMPTOMATIC   Past Surgical History:  Procedure Laterality Date   Abdominal Doppler ultrasound  February 2009    Right ostial renal artery stenosis; normal renal structure.   APPENDECTOMY  1968   CARDIAC ELECTROPHYSIOLOGY MAPPING AND ABLATION  2017   CYSTOSCOPY W/ URETEROSCOPY W/ LITHOTRIPSY   1999   Large calcium  oxalate stone   CYSTOSCOPY/RETROGRADE/URETEROSCOPY/STONE EXTRACTION WITH BASKET  1972   ELECTROPHYSIOLOGIC STUDY N/A 05/13/2016   Procedure: Atrial Fibrillation Ablation;  Surgeon: Jolly Needle, MD;  Location: [facility] INVASIVE CV LAB;  Service: Cardiovascular;  Laterality: N/A;   EXCISIONAL HEMORRHOIDECTOMY   1981   FRACTURE SURGERY     IRRIGATION AND DEBRIDEMENT SEBACEOUS CYST  January 2015    chest wall; with antibiotics --> following this treatment, SVT episodes became much less frequent.   NM MYOVIEW  LTD  12/2015   LOW RISK.  No ischemia or infarction.  Small septal-anterior defect likely related to left bundle branch block.   ORIF PROXIMAL TIBIAL PLATEAU FRACTURE Left 2007   , Related shattered left tibial plateau with reattachment of torn ligaments and insertion of titanium plate and screws.   RENAL ARTERY DUPLEX  01/02/2014   R&L RA - mildly elevated velocities - < 60%; Bilateral Kidneys - normal shape & size.   SARCOMA EXCISION Left 2015   dermatofibro sarcoma protuberens, wide excision of groin from front side to buttocks"    SOFT TISSUE BIOPSY Left 2015   TOTAL ABDOMINAL HYSTERECTOMY  1994   TRANSTHORACIC ECHOCARDIOGRAM  12/2015   Normal LV size and function.  EF 55%.  Mild LVH.  Septal-lateral dyssynchrony (from LBBB).  No significant valvular abnormalities.   TREADMILL STRESS ECHO  01/2009   No regional wall motion abnormalities with stress == non-ischemic; rate related incomplete left bundle branch block   Family History  Problem Relation Age of  Onset   Heart disease Brother        Died at 49   Heart disease Mother        Died at 31   Heart disease Father        Died at 82   Breast cancer Sister        Died at 16   Social History   Socioeconomic History   Marital status: Widowed    Spouse name: Not on file   Number of children: Not on file   Years of education: Not on file   Highest education level: Not on file  Occupational History   Occupation: retired  Tobacco Use   Smoking status: Former    Current packs/day: 0.50    Average packs/day: 0.5 packs/day for 15.0 years (7.5 ttl pk-yrs)    Types: Cigarettes   Smokeless tobacco: Never   Tobacco comments:    "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 She is now widowed for just under 2 years. Her husband died of cancer. They do not have any children.   She recently (spring of 2015) moved to [place]  to be closer to her brother, Ladona Pickles and his family.   She been the caregiver for her ailing husband for 5 years. He passed away in 08-15-13and she has had prolonged period of grieving partly due to her having lost 3 members of her immediate family during that time period as well.  --- She is under a lot of stress      She is a former  smoker quit years ago. She does not drink alcohol. She is active, but not routine exercise. She says that she tries to eat healthy and knows what usually affects her triglyceride levels.   Social Drivers of Corporate investment banker Strain: Low Risk  (11/22/2023)   Overall Financial Resource Strain (CARDIA)    Difficulty of Paying Living Expenses: Not hard at all  Food Insecurity: No Food Insecurity (11/22/2023)   Hunger Vital Sign    Worried About Running Out of Food in the Last Year: Never true    Ran Out of Food in the Last Year: Never true  Transportation Needs: No Transportation Needs (11/22/2023)   PRAPARE - Administrator, Civil Service (Medical): No    Lack of Transportation (Non-Medical): No  Physical Activity: Insufficiently Active (11/22/2023)   Exercise Vital Sign    Days of Exercise per Week: 3 days    Minutes of Exercise per Session: 20 min  Stress: No Stress Concern Present (11/22/2023)   [employer] of Occupational Health - Occupational Stress Questionnaire    Feeling of Stress : Not at all  Social Connections: Moderately Integrated (11/22/2023)   Social Connection and Isolation Panel [NHANES]    Frequency of Communication with Friends and Family: Three times a week    Frequency of Social Gatherings with Friends and Family: Three times a week    Attends Religious Services: More than 4 times per year    Active Member of Clubs or Organizations: Yes    Attends Banker Meetings: 1  to 4 times per year    Marital Status: Widowed    Tobacco Counseling Counseling given: Not Answered Tobacco comments: "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 bring in CBG monitor from  home?: No  Lab Results  Component Value Date   HGBA1C 7.1 04/13/2018     How often do you need to have someone help you when you read instructions, pamphlets, or other written materials from your doctor or pharmacy?: 1 - Never  Interpreter Needed?: No  Information entered by :: Lamont Pilsner, LPN   Activities of Daily Living     11/22/2023    3:21 PM  In your present state of health, do you have any difficulty performing the following activities:  Hearing? 0  Vision? 0  Difficulty concentrating or making decisions? 0  Walking or climbing stairs? 0  Dressing or bathing? 0  Doing errands, shopping? 0  Preparing Food and eating ? N  Using the Toilet? N  In the past six months, have you accidently leaked urine? N  Do you have problems with loss of bowel control? N  Managing your Medications? N  Managing your Finances? N  Housekeeping or managing your Housekeeping? N    Patient Care Team: [name] Clamp, MD as PCP - General (Family Medicine) Rosiland Cooks, Murat K, MD as Consulting Physician (Specialist) Tolbert Fothergill, MD as Consulting Physician (Internal Medicine)  Indicate any recent Medical Services you may have received from other than [facility] providers in the past year (date may be approximate).     Assessment:   This is a routine wellness examination for Isabella Wright.  Hearing/Vision screen Hearing Screening - Comments:: Pt denies any hearing issues  Vision Screening - Comments:: Wears rx glasses - up to date with routine eye exams with  Dr Elnita Hai    Goals Addressed             This Visit's Progress    Patient Stated       Maintain health and activity        Depression Screen     11/22/2023    3:19 PM 11/15/2023    7:58 AM 12/29/2022   10:57 AM 09/27/2022    9:41 AM 09/19/2022   11:33 AM 04/04/2022    8:07 AM 09/14/2021    9:37 AM  PHQ 2/9 Scores  PHQ - 2 Score 0 0 0 0 0 0 0    Fall Risk     11/22/2023    3:20 PM 11/15/2023    7:58 AM 09/27/2023    8:43 AM  12/29/2022   10:57 AM 09/27/2022    9:41 AM  Fall Risk   Falls in the past year? 0 1 1 0 0  Number falls in past yr: 0 1 0 0 0  Injury with Fall? 0 1 1 0 0  Risk for fall due to : No Fall Risks No Fall Risks No Fall Risks No Fall Risks Impaired vision  Follow up Falls prevention discussed  Falls evaluation completed  Falls prevention discussed    MEDICARE RISK AT HOME:  Medicare Risk at Home Any stairs in or around the home?: Yes If so, are there any  without handrails?: No Home free of loose throw rugs in walkways, pet beds, electrical cords, etc?: Yes Adequate lighting in your home to reduce risk of falls?: Yes Life alert?: Yes (apple watch) Use of a cane, walker or w/c?: No Grab bars in the bathroom?: Yes Shower chair or bench in shower?: Yes Elevated toilet seat or a handicapped toilet?: No  TIMED UP AND GO:  Was the test performed?  No  Cognitive Function: 6CIT completed        11/22/2023    3:23 PM 09/27/2022    9:43 AM 08/14/2020    1:16 PM  6CIT Screen  What Year? 0 points 0 points 0 points  What month? 0 points 0 points 0 points  What time? 0 points 0 points   Count back from 20 0 points 0 points 0 points  Months in reverse 0 points 0 points 0 points  Repeat phrase 0 points 0 points 0 points  Total Score 0 points 0 points     Immunizations Immunization History  Administered Date(s) Administered   Fluad Quad(high Dose 65+) 04/08/2020, 05/01/2021, 05/03/2022   Fluad Trivalent(High Dose 65+) 05/09/2023   Influenza Inj Mdck Quad Pf 04/16/2019   Influenza Split 06/05/2015   Influenza, High Dose Seasonal PF 06/05/2015, 05/01/2016, 05/22/2017, 04/13/2018, 04/13/2020   Influenza,inj,Quad PF,6+ Mos 04/13/2018   Influenza,inj,quad, With Preservative 04/16/2019   Influenza,trivalent, recombinat, inj, PF 06/05/2015   Influenza-Unspecified 04/13/2018   PFIZER(Purple Top)SARS-COV-2 Vaccination 08/08/2019, 08/29/2019, 04/27/2020   Pneumococcal Conjugate-13 08/17/2014    Pneumococcal Polysaccharide-23 08/18/2015   Pneumococcal-Unspecified 07/19/2015   Td 07/28/2005   Td (Adult), 2 Lf Tetanus Toxid, Preservative Free 07/28/2005   Tdap 08/22/2022    Screening Tests Health Maintenance  Topic Date Due   Diabetic kidney evaluation - Urine ACR  Never done   Hepatitis C Screening  Never done   HEMOGLOBIN A1C  10/12/2018   FOOT EXAM  08/13/2022   OPHTHALMOLOGY EXAM  08/25/2023   Medicare Annual Wellness (AWV)  09/27/2023   Zoster Vaccines- Shingrix (1 of 2) 12/28/2023 (Originally 11/12/1964)   INFLUENZA VACCINE  02/16/2024   Diabetic kidney evaluation - eGFR measurement  09/23/2024   DTaP/Tdap/Td (4 - Td or Tdap) 08/22/2032   Pneumonia Vaccine 68+ Years old  Completed   HPV VACCINES  Aged Out   Meningococcal B Vaccine  Aged Out   DEXA SCAN  Discontinued   Colonoscopy  Discontinued   COVID-19 Vaccine  Discontinued    Health Maintenance  Health Maintenance Due  Topic Date Due   Diabetic kidney evaluation - Urine ACR  Never done   Hepatitis C Screening  Never done   HEMOGLOBIN A1C  10/12/2018   FOOT EXAM  08/13/2022   OPHTHALMOLOGY EXAM  08/25/2023   Medicare Annual Wellness (AWV)  09/27/2023   Health Maintenance Items Addressed: See Nurse Notes  Additional Screening:  Vision Screening: Recommended annual ophthalmology exams for early detection of glaucoma and other disorders of the eye.  Dental Screening: Recommended annual dental exams for proper oral hygiene  Community Resource Referral / Chronic Care Management: CRR required this visit?  No   CCM required this visit?  No     Plan:     I have personally reviewed and noted the following in the patient's chart:   Medical and social history Use of alcohol, tobacco or illicit drugs  Current medications and supplements including opioid prescriptions. Patient is not currently taking opioid prescriptions. Functional ability and status Nutritional status Physical activity Advanced  directives List of other physicians Hospitalizations, surgeries, and ER visits in previous 12 months Vitals Screenings to include cognitive, depression, and falls Referrals and appointments  In addition, I have reviewed and discussed with patient certain preventive protocols, quality metrics, and best practice recommendations. A written personalized care plan for preventive services as well as general preventive health recommendations were provided to patient.     Bruno Capri, LPN   12/19/4032   After Visit Summary: (MyChart) Due to this being a telephonic visit, the after visit summary with patients personalized plan was offered to patient via MyChart   Notes: Nothing significant to report at this time.

## 2023-11-22 NOTE — Patient Instructions (Signed)
 Isabella Wright , Thank you for taking time to come for your Medicare Wellness Visit. I appreciate your ongoing commitment to your health goals. Please review the following plan we discussed and let me know if I can assist you in the future.   Referrals/Orders/Follow-Ups/Clinician Recommendations: maintain health and activity   This is a list of the screening recommended for you and due dates:  Health Maintenance  Topic Date Due   Yearly kidney health urinalysis for diabetes  Never done   Hepatitis C Screening  Never done   Hemoglobin A1C  10/12/2018   Complete foot exam   08/13/2022   Eye exam for diabetics  08/25/2023   Medicare Annual Wellness Visit  09/27/2023   Zoster (Shingles) Vaccine (1 of 2) 12/28/2023*   Flu Shot  02/16/2024   Yearly kidney function blood test for diabetes  09/23/2024   DTaP/Tdap/Td vaccine (4 - Td or Tdap) 08/22/2032   Pneumonia Vaccine  Completed   HPV Vaccine  Aged Out   Meningitis B Vaccine  Aged Out   DEXA scan (bone density measurement)  Discontinued   Colon Cancer Screening  Discontinued   COVID-19 Vaccine  Discontinued  *Topic was postponed. The date shown is not the original due date.    Advanced directives: (Copy Requested) Please bring a copy of your health care power of attorney and living will to the office to be added to your chart at your convenience. You can mail to [facility] [address] [place], [place] [address] or email to [email or web address]  Next Medicare Annual Wellness Visit scheduled for next year: Yes  Have you seen your provider in the last 6 months (3 months if uncontrolled diabetes)? Yes3/12/25

## 2023-11-23 DIAGNOSIS — H53413 Scotoma involving central area, bilateral: Secondary | ICD-10-CM | POA: Diagnosis not present

## 2023-11-23 NOTE — Telephone Encounter (Unsigned)
 Copied from CRM [id number]. Topic: General - Other >> Nov 23, 2023  3:00 PM Adrionna Y wrote: Reason for CRM: Patient is calling to see if there is anything else she can be prescribed, she doesn't feel like her medication is working would like a call back phone number on file is current

## 2023-11-24 ENCOUNTER — Other Ambulatory Visit: Payer: Self-pay | Admitting: Family Medicine

## 2023-11-28 ENCOUNTER — Other Ambulatory Visit: Payer: Self-pay | Admitting: Family Medicine

## 2023-12-06 DIAGNOSIS — H43813 Vitreous degeneration, bilateral: Secondary | ICD-10-CM | POA: Diagnosis not present

## 2023-12-06 DIAGNOSIS — H2513 Age-related nuclear cataract, bilateral: Secondary | ICD-10-CM | POA: Diagnosis not present

## 2023-12-06 DIAGNOSIS — H353123 Nonexudative age-related macular degeneration, left eye, advanced atrophic without subfoveal involvement: Secondary | ICD-10-CM | POA: Diagnosis not present

## 2023-12-06 DIAGNOSIS — H353232 Exudative age-related macular degeneration, bilateral, with inactive choroidal neovascularization: Secondary | ICD-10-CM | POA: Diagnosis not present

## 2023-12-06 DIAGNOSIS — H353114 Nonexudative age-related macular degeneration, right eye, advanced atrophic with subfoveal involvement: Secondary | ICD-10-CM | POA: Diagnosis not present

## 2023-12-25 DIAGNOSIS — H53413 Scotoma involving central area, bilateral: Secondary | ICD-10-CM | POA: Diagnosis not present

## 2024-01-01 DIAGNOSIS — H53413 Scotoma involving central area, bilateral: Secondary | ICD-10-CM | POA: Diagnosis not present

## 2024-01-10 DIAGNOSIS — H53413 Scotoma involving central area, bilateral: Secondary | ICD-10-CM | POA: Diagnosis not present

## 2024-01-15 DIAGNOSIS — H53413 Scotoma involving central area, bilateral: Secondary | ICD-10-CM | POA: Diagnosis not present

## 2024-01-17 DIAGNOSIS — E119 Type 2 diabetes mellitus without complications: Secondary | ICD-10-CM | POA: Diagnosis not present

## 2024-01-17 DIAGNOSIS — E1169 Type 2 diabetes mellitus with other specified complication: Secondary | ICD-10-CM | POA: Diagnosis not present

## 2024-01-17 DIAGNOSIS — E782 Mixed hyperlipidemia: Secondary | ICD-10-CM | POA: Diagnosis not present

## 2024-01-17 DIAGNOSIS — Z794 Long term (current) use of insulin: Secondary | ICD-10-CM | POA: Diagnosis not present

## 2024-01-31 DIAGNOSIS — H353114 Nonexudative age-related macular degeneration, right eye, advanced atrophic with subfoveal involvement: Secondary | ICD-10-CM | POA: Diagnosis not present

## 2024-01-31 DIAGNOSIS — H353232 Exudative age-related macular degeneration, bilateral, with inactive choroidal neovascularization: Secondary | ICD-10-CM | POA: Diagnosis not present

## 2024-01-31 DIAGNOSIS — H43813 Vitreous degeneration, bilateral: Secondary | ICD-10-CM | POA: Diagnosis not present

## 2024-01-31 DIAGNOSIS — H353123 Nonexudative age-related macular degeneration, left eye, advanced atrophic without subfoveal involvement: Secondary | ICD-10-CM | POA: Diagnosis not present

## 2024-01-31 DIAGNOSIS — H2513 Age-related nuclear cataract, bilateral: Secondary | ICD-10-CM | POA: Diagnosis not present

## 2024-03-11 ENCOUNTER — Other Ambulatory Visit: Payer: Self-pay | Admitting: Family Medicine

## 2024-03-11 DIAGNOSIS — J020 Streptococcal pharyngitis: Secondary | ICD-10-CM

## 2024-03-14 DIAGNOSIS — H5203 Hypermetropia, bilateral: Secondary | ICD-10-CM | POA: Diagnosis not present

## 2024-03-25 ENCOUNTER — Other Ambulatory Visit: Payer: Self-pay | Admitting: Family Medicine

## 2024-03-26 DIAGNOSIS — D1801 Hemangioma of skin and subcutaneous tissue: Secondary | ICD-10-CM | POA: Diagnosis not present

## 2024-03-26 DIAGNOSIS — L821 Other seborrheic keratosis: Secondary | ICD-10-CM | POA: Diagnosis not present

## 2024-03-26 DIAGNOSIS — D225 Melanocytic nevi of trunk: Secondary | ICD-10-CM | POA: Diagnosis not present

## 2024-03-26 DIAGNOSIS — L812 Freckles: Secondary | ICD-10-CM | POA: Diagnosis not present

## 2024-03-27 DIAGNOSIS — E559 Vitamin D deficiency, unspecified: Secondary | ICD-10-CM | POA: Diagnosis not present

## 2024-03-27 DIAGNOSIS — D500 Iron deficiency anemia secondary to blood loss (chronic): Secondary | ICD-10-CM | POA: Diagnosis not present

## 2024-03-27 DIAGNOSIS — K760 Fatty (change of) liver, not elsewhere classified: Secondary | ICD-10-CM | POA: Diagnosis not present

## 2024-03-27 DIAGNOSIS — D751 Secondary polycythemia: Secondary | ICD-10-CM | POA: Diagnosis not present

## 2024-03-27 DIAGNOSIS — I10 Essential (primary) hypertension: Secondary | ICD-10-CM | POA: Diagnosis not present

## 2024-03-27 DIAGNOSIS — H3530 Unspecified macular degeneration: Secondary | ICD-10-CM | POA: Diagnosis not present

## 2024-03-27 DIAGNOSIS — Z794 Long term (current) use of insulin: Secondary | ICD-10-CM | POA: Diagnosis not present

## 2024-03-27 DIAGNOSIS — R160 Hepatomegaly, not elsewhere classified: Secondary | ICD-10-CM | POA: Diagnosis not present

## 2024-03-27 DIAGNOSIS — I4819 Other persistent atrial fibrillation: Secondary | ICD-10-CM | POA: Diagnosis not present

## 2024-03-27 DIAGNOSIS — E119 Type 2 diabetes mellitus without complications: Secondary | ICD-10-CM | POA: Diagnosis not present

## 2024-03-27 DIAGNOSIS — C44799 Other specified malignant neoplasm of skin of left lower limb, including hip: Secondary | ICD-10-CM | POA: Diagnosis not present

## 2024-03-28 DIAGNOSIS — H353114 Nonexudative age-related macular degeneration, right eye, advanced atrophic with subfoveal involvement: Secondary | ICD-10-CM | POA: Diagnosis not present

## 2024-03-28 DIAGNOSIS — H43813 Vitreous degeneration, bilateral: Secondary | ICD-10-CM | POA: Diagnosis not present

## 2024-03-28 DIAGNOSIS — H353212 Exudative age-related macular degeneration, right eye, with inactive choroidal neovascularization: Secondary | ICD-10-CM | POA: Diagnosis not present

## 2024-03-28 DIAGNOSIS — H2513 Age-related nuclear cataract, bilateral: Secondary | ICD-10-CM | POA: Diagnosis not present

## 2024-03-28 DIAGNOSIS — H353221 Exudative age-related macular degeneration, left eye, with active choroidal neovascularization: Secondary | ICD-10-CM | POA: Diagnosis not present

## 2024-03-28 DIAGNOSIS — H353123 Nonexudative age-related macular degeneration, left eye, advanced atrophic without subfoveal involvement: Secondary | ICD-10-CM | POA: Diagnosis not present

## 2024-04-02 ENCOUNTER — Other Ambulatory Visit: Payer: Self-pay | Admitting: Family Medicine

## 2024-04-08 DIAGNOSIS — I10 Essential (primary) hypertension: Secondary | ICD-10-CM | POA: Diagnosis not present

## 2024-04-08 DIAGNOSIS — I447 Left bundle-branch block, unspecified: Secondary | ICD-10-CM | POA: Diagnosis not present

## 2024-04-08 DIAGNOSIS — E782 Mixed hyperlipidemia: Secondary | ICD-10-CM | POA: Diagnosis not present

## 2024-04-08 DIAGNOSIS — R002 Palpitations: Secondary | ICD-10-CM | POA: Diagnosis not present

## 2024-04-08 DIAGNOSIS — Z8679 Personal history of other diseases of the circulatory system: Secondary | ICD-10-CM | POA: Diagnosis not present

## 2024-04-08 DIAGNOSIS — R011 Cardiac murmur, unspecified: Secondary | ICD-10-CM | POA: Diagnosis not present

## 2024-04-22 DIAGNOSIS — E119 Type 2 diabetes mellitus without complications: Secondary | ICD-10-CM | POA: Diagnosis not present

## 2024-04-22 DIAGNOSIS — E1169 Type 2 diabetes mellitus with other specified complication: Secondary | ICD-10-CM | POA: Diagnosis not present

## 2024-04-22 DIAGNOSIS — E782 Mixed hyperlipidemia: Secondary | ICD-10-CM | POA: Diagnosis not present

## 2024-04-22 DIAGNOSIS — Z794 Long term (current) use of insulin: Secondary | ICD-10-CM | POA: Diagnosis not present

## 2024-04-25 DIAGNOSIS — H353123 Nonexudative age-related macular degeneration, left eye, advanced atrophic without subfoveal involvement: Secondary | ICD-10-CM | POA: Diagnosis not present

## 2024-04-25 DIAGNOSIS — H353114 Nonexudative age-related macular degeneration, right eye, advanced atrophic with subfoveal involvement: Secondary | ICD-10-CM | POA: Diagnosis not present

## 2024-05-23 DIAGNOSIS — H353221 Exudative age-related macular degeneration, left eye, with active choroidal neovascularization: Secondary | ICD-10-CM | POA: Diagnosis not present

## 2024-06-21 ENCOUNTER — Other Ambulatory Visit: Payer: Self-pay | Admitting: Family Medicine

## 2024-06-24 DIAGNOSIS — H353212 Exudative age-related macular degeneration, right eye, with inactive choroidal neovascularization: Secondary | ICD-10-CM | POA: Diagnosis not present

## 2024-06-24 DIAGNOSIS — H43813 Vitreous degeneration, bilateral: Secondary | ICD-10-CM | POA: Diagnosis not present

## 2024-06-24 DIAGNOSIS — H353114 Nonexudative age-related macular degeneration, right eye, advanced atrophic with subfoveal involvement: Secondary | ICD-10-CM | POA: Diagnosis not present

## 2024-06-24 DIAGNOSIS — H353123 Nonexudative age-related macular degeneration, left eye, advanced atrophic without subfoveal involvement: Secondary | ICD-10-CM | POA: Diagnosis not present

## 2024-06-24 DIAGNOSIS — H2513 Age-related nuclear cataract, bilateral: Secondary | ICD-10-CM | POA: Diagnosis not present

## 2024-06-24 DIAGNOSIS — H353221 Exudative age-related macular degeneration, left eye, with active choroidal neovascularization: Secondary | ICD-10-CM | POA: Diagnosis not present

## 2024-06-26 DIAGNOSIS — Z1231 Encounter for screening mammogram for malignant neoplasm of breast: Secondary | ICD-10-CM | POA: Diagnosis not present

## 2024-06-26 LAB — HM MAMMOGRAPHY

## 2024-07-03 ENCOUNTER — Other Ambulatory Visit: Payer: Self-pay | Admitting: Family Medicine
